# Patient Record
Sex: Male | Born: 1950 | Race: White | Hispanic: No | Smoking: Former smoker
Health system: Southern US, Community
[De-identification: ages and names within clinical notes are randomized; demographics above are authoritative.]

## PROBLEM LIST (undated history)

## (undated) DIAGNOSIS — M549 Dorsalgia, unspecified: Secondary | ICD-10-CM

## (undated) DIAGNOSIS — I10 Essential (primary) hypertension: Secondary | ICD-10-CM

## (undated) DIAGNOSIS — I639 Cerebral infarction, unspecified: Secondary | ICD-10-CM

## (undated) DIAGNOSIS — Z720 Tobacco use: Secondary | ICD-10-CM

## (undated) DIAGNOSIS — J449 Chronic obstructive pulmonary disease, unspecified: Secondary | ICD-10-CM

## (undated) HISTORY — PX: EYE SURGERY: SHX253

## (undated) HISTORY — PX: HERNIA REPAIR: SHX51

---

## 2001-03-14 ENCOUNTER — Other Ambulatory Visit: Admission: RE | Admit: 2001-03-14 | Discharge: 2001-03-14 | Payer: Self-pay | Admitting: Family Medicine

## 2003-07-31 ENCOUNTER — Ambulatory Visit (HOSPITAL_COMMUNITY): Admission: RE | Admit: 2003-07-31 | Discharge: 2003-07-31 | Payer: Self-pay | Admitting: Family Medicine

## 2005-06-29 ENCOUNTER — Ambulatory Visit (HOSPITAL_COMMUNITY): Admission: RE | Admit: 2005-06-29 | Discharge: 2005-06-29 | Payer: Self-pay | Admitting: Family Medicine

## 2007-01-09 ENCOUNTER — Ambulatory Visit (HOSPITAL_COMMUNITY): Admission: RE | Admit: 2007-01-09 | Discharge: 2007-01-09 | Payer: Self-pay | Admitting: Family Medicine

## 2008-01-31 ENCOUNTER — Emergency Department (HOSPITAL_COMMUNITY): Admission: EM | Admit: 2008-01-31 | Discharge: 2008-01-31 | Payer: Self-pay | Admitting: Emergency Medicine

## 2008-03-15 ENCOUNTER — Emergency Department (HOSPITAL_COMMUNITY): Admission: EM | Admit: 2008-03-15 | Discharge: 2008-03-15 | Payer: Self-pay | Admitting: Emergency Medicine

## 2010-08-16 ENCOUNTER — Emergency Department (HOSPITAL_COMMUNITY)
Admission: EM | Admit: 2010-08-16 | Discharge: 2010-08-16 | Disposition: A | Discharge status: Home / Self Care | Payer: Non-veteran care | Attending: Emergency Medicine | Admitting: Emergency Medicine

## 2010-08-16 ENCOUNTER — Emergency Department (HOSPITAL_COMMUNITY): Payer: Non-veteran care

## 2010-08-16 DIAGNOSIS — J449 Chronic obstructive pulmonary disease, unspecified: Secondary | ICD-10-CM | POA: Insufficient documentation

## 2010-08-16 DIAGNOSIS — I10 Essential (primary) hypertension: Secondary | ICD-10-CM | POA: Insufficient documentation

## 2010-08-16 DIAGNOSIS — M545 Low back pain, unspecified: Secondary | ICD-10-CM | POA: Insufficient documentation

## 2010-08-16 DIAGNOSIS — M51379 Other intervertebral disc degeneration, lumbosacral region without mention of lumbar back pain or lower extremity pain: Secondary | ICD-10-CM | POA: Insufficient documentation

## 2010-08-16 DIAGNOSIS — J4489 Other specified chronic obstructive pulmonary disease: Secondary | ICD-10-CM | POA: Insufficient documentation

## 2010-08-16 DIAGNOSIS — M5137 Other intervertebral disc degeneration, lumbosacral region: Secondary | ICD-10-CM | POA: Insufficient documentation

## 2010-08-16 DIAGNOSIS — Z79899 Other long term (current) drug therapy: Secondary | ICD-10-CM | POA: Insufficient documentation

## 2011-02-14 LAB — POCT CARDIAC MARKERS
CKMB, poc: 1.2
Myoglobin, poc: 119
Myoglobin, poc: 180
Troponin i, poc: <0.05
Troponin i, poc: <0.05

## 2011-02-14 LAB — DIFFERENTIAL
Eosinophils Relative: 4
Lymphocytes Relative: 30
Lymphs Abs: 3
Monocytes Absolute: 0.7
Neutro Abs: 5.8
Neutrophils Relative %: 58

## 2011-02-14 LAB — CBC
Hemoglobin: 15.1
MCHC: 34.7
RDW: 13.9
WBC: 10

## 2011-02-14 LAB — BASIC METABOLIC PANEL
BUN: 11
CO2: 27
Chloride: 105
GFR calc Af Amer: >60

## 2011-02-14 LAB — B-NATRIURETIC PEPTIDE (CONVERTED LAB): Pro B Natriuretic peptide (BNP): <30

## 2011-02-14 LAB — CULTURE, BLOOD (ROUTINE X 2)

## 2014-11-02 ENCOUNTER — Encounter (HOSPITAL_COMMUNITY): Payer: Self-pay | Admitting: *Deleted

## 2014-11-02 ENCOUNTER — Emergency Department (HOSPITAL_COMMUNITY)
Admission: EM | Admit: 2014-11-02 | Discharge: 2014-11-02 | Disposition: A | Discharge status: Home / Self Care | Payer: Medicare Other | Attending: Emergency Medicine | Admitting: Emergency Medicine

## 2014-11-02 DIAGNOSIS — Y9389 Activity, other specified: Secondary | ICD-10-CM | POA: Insufficient documentation

## 2014-11-02 DIAGNOSIS — S39012A Strain of muscle, fascia and tendon of lower back, initial encounter: Secondary | ICD-10-CM | POA: Diagnosis not present

## 2014-11-02 DIAGNOSIS — J449 Chronic obstructive pulmonary disease, unspecified: Secondary | ICD-10-CM | POA: Insufficient documentation

## 2014-11-02 DIAGNOSIS — I10 Essential (primary) hypertension: Secondary | ICD-10-CM | POA: Diagnosis not present

## 2014-11-02 DIAGNOSIS — Y998 Other external cause status: Secondary | ICD-10-CM | POA: Diagnosis not present

## 2014-11-02 DIAGNOSIS — Z720 Tobacco use: Secondary | ICD-10-CM | POA: Diagnosis not present

## 2014-11-02 DIAGNOSIS — X58XXXA Exposure to other specified factors, initial encounter: Secondary | ICD-10-CM | POA: Diagnosis not present

## 2014-11-02 DIAGNOSIS — Y9289 Other specified places as the place of occurrence of the external cause: Secondary | ICD-10-CM | POA: Insufficient documentation

## 2014-11-02 DIAGNOSIS — S3992XA Unspecified injury of lower back, initial encounter: Secondary | ICD-10-CM | POA: Diagnosis present

## 2014-11-02 HISTORY — DX: Dorsalgia, unspecified: M54.9

## 2014-11-02 HISTORY — DX: Essential (primary) hypertension: I10

## 2014-11-02 HISTORY — DX: Chronic obstructive pulmonary disease, unspecified: J44.9

## 2014-11-02 MED ORDER — METHOCARBAMOL 500 MG PO TABS: 500.0000 mg | ORAL_TABLET | Freq: Three times a day (TID) | ORAL | Status: DC

## 2014-11-02 MED ORDER — METHOCARBAMOL 500 MG PO TABS
500.0000 mg | ORAL_TABLET | Freq: Once | ORAL | Status: AC
  Administered 2014-11-02: 500 mg via ORAL
  Filled 2014-11-02: qty 1

## 2014-11-02 NOTE — ED Notes (Signed)
Pt with hx of back pain, states pain is same, started Friday, states pain radiates down left leg, denies control of bladder or bowels

## 2014-11-02 NOTE — ED Provider Notes (Signed)
CSN: [id number]     Arrival date & time 11/02/14  1535 History  This chart was scribed for non-physician practitioner, Pauline Aus, PA-C, working with Mancel Bale, MD, by Budd Palmer ED Scribe. This patient was seen in room APFT21/APFT21 and the patient's care was started at 3:56 PM  Chief Complaint  Patient presents with  . Back Pain   Patient is a 64 y.o. male presenting with back pain. The history is provided by the patient. No language interpreter was used.  Back Pain Location:  Lumbar spine and sacro-iliac joint Quality:  Aching Radiates to:  L posterior upper leg and L thigh Pain severity:  Moderate Pain is:  Worse during the day Onset quality:  Gradual Duration:  2 days Timing:  Constant Progression:  Worsening Chronicity:  Recurrent Relieved by:  Ibuprofen, lying down and heating pad Worsened by:  Standing and sitting Associated symptoms: leg pain   Associated symptoms: no abdominal pain, no bladder incontinence, no bowel incontinence, no dysuria, no fever, no numbness and no weakness    HPI Comments: Darryl George is a 64 y.o. male with a PMHx of back pain and hypertension, who presents to the Emergency Department complaining of progressively worsening lower back pain, worse on the left side onset 2 days ago.  Pt states pain radiates down to the left hip and and also left upper leg when standing. He reports heavy lifting several weeks ago. He reports relief when lying supine, and exacerbation when sitting or standing. He denies associated numbness or weakness of the lower extremities, abdominal pain, bladder or bowel incontinence. He states that he has been taking 200mg  Advil every 4 hours with moderate relief.  Past Medical History  Diagnosis Date  . Back pain   . COPD (chronic obstructive pulmonary disease)   . Hypertension    Past Surgical History  Procedure Laterality Date  . Hernia repair    . Eye surgery     History reviewed. No pertinent family  history. History  Substance Use Topics  . Smoking status: Current Every Day Smoker    Types: Cigarettes  . Smokeless tobacco: Not on file  . Alcohol Use: Yes     Comment: once a month   Review of Systems  Constitutional: Negative for fever.  Respiratory: Negative for shortness of breath.   Gastrointestinal: Negative for vomiting, abdominal pain, constipation and bowel incontinence.  Genitourinary: Negative for bladder incontinence, dysuria, hematuria, flank pain, decreased urine volume and difficulty urinating.  Musculoskeletal: Positive for back pain. Negative for joint swelling.  Skin: Negative for rash.  Neurological: Negative for weakness and numbness.  All other systems reviewed and are negative.  Allergies  Review of patient's allergies indicates no known allergies.  Home Medications   Prior to Admission medications   Not on File   Triage Vitals: BP 165/76 mmHg  Pulse 68  Temp(Src) 98.3 F (36.8 C) (Oral)  Resp 20  Ht 6\' 1"  (1.854 m)  Wt 185 lb (83.915 kg)  BMI 24.41 kg/m2  SpO2 96%  Physical Exam  Constitutional: He is oriented to person, place, and time. He appears well-developed and well-nourished. No distress.  HENT:  Head: Normocephalic and atraumatic.  Neck: Normal range of motion. Neck supple.  Cardiovascular: Normal rate, regular rhythm, normal heart sounds and intact distal pulses.   No murmur heard. Pulmonary/Chest: Effort normal and breath sounds normal. No respiratory distress.  Abdominal: Soft. He exhibits no distension. There is no tenderness.  Musculoskeletal: He exhibits tenderness. He  exhibits no edema.       Lumbar back: He exhibits tenderness and pain. He exhibits normal range of motion, no swelling, no deformity, no laceration and normal pulse.  ttp of the left lumbar paraspinal muscles.  No spinal tenderness.  DP pulses are brisk and symmetrical.  Distal sensation intact.  Hip Flexors/Extensors are intact.  Pt has 5/5 strength against  resistance of bilateral lower extremities.  Left low back pain reproduced with straight leg raise on the right   Neurological: He is alert and oriented to person, place, and time. He has normal strength. No sensory deficit. He exhibits normal muscle tone. Coordination and gait normal.  Reflex Scores:      Patellar reflexes are 2+ on the right side and 2+ on the left side.      Achilles reflexes are 2+ on the right side and 2+ on the left side. Skin: Skin is warm and dry. No rash noted.  Nursing note and vitals reviewed.  ED Course  Procedures (including critical care time) DIAGNOSTIC STUDIES: Oxygen Saturation is 96% on RA, adequate by my interpretation.    COORDINATION OF CARE: 3:59 PM - Discussed plans for pt to continue taking advil as needed no less than every 6 hours. Will give patient Robaxin for pain management. Recommended applying ice packs and heat. Advised against heavy lifting for 2 weeks. [facility]. Pt advised of plan for treatment and pt agrees.  Labs Review Labs Reviewed - No data to display  Imaging Review No results found.   EKG Interpretation None     MDM   Final diagnoses:  Lumbar strain, initial encounter    pt ambulates with a steady gait.  No focal neuro deficits on exam.  Likely musculoskeletal pain.  No concerning sx's for emergent neurological or infectious process.     I personally performed the services described in this documentation, which was scribed in my presence. The recorded information has been reviewed and is accurate.    Pauline Aus, PA-C 11/02/14 1624  Mancel Bale, MD 11/03/14 [phone]

## 2014-11-02 NOTE — Discharge Instructions (Signed)

## 2016-01-23 ENCOUNTER — Encounter (HOSPITAL_COMMUNITY)
Admission: EM | Disposition: A | Discharge status: Skilled Nursing Facility | Payer: Self-pay | Source: Home / Self Care | Attending: Cardiothoracic Surgery

## 2016-01-23 ENCOUNTER — Emergency Department (HOSPITAL_COMMUNITY): Admit: 2016-01-23 | Payer: Medicare Other

## 2016-01-23 ENCOUNTER — Encounter (HOSPITAL_COMMUNITY): Payer: Self-pay | Admitting: Emergency Medicine

## 2016-01-23 ENCOUNTER — Inpatient Hospital Stay (HOSPITAL_COMMUNITY)
Admission: EM | Admit: 2016-01-23 | Discharge: 2016-02-04 | DRG: 233 | Disposition: A | Discharge status: Skilled Nursing Facility | Payer: Medicare Other | Attending: Cardiothoracic Surgery | Admitting: Cardiothoracic Surgery

## 2016-01-23 ENCOUNTER — Emergency Department (HOSPITAL_COMMUNITY): Payer: Medicare Other

## 2016-01-23 DIAGNOSIS — I214 Non-ST elevation (NSTEMI) myocardial infarction: Secondary | ICD-10-CM

## 2016-01-23 DIAGNOSIS — R918 Other nonspecific abnormal finding of lung field: Secondary | ICD-10-CM | POA: Diagnosis not present

## 2016-01-23 DIAGNOSIS — F05 Delirium due to known physiological condition: Secondary | ICD-10-CM | POA: Diagnosis not present

## 2016-01-23 DIAGNOSIS — I080 Rheumatic disorders of both mitral and aortic valves: Secondary | ICD-10-CM | POA: Diagnosis not present

## 2016-01-23 DIAGNOSIS — Z9119 Patient's noncompliance with other medical treatment and regimen: Secondary | ICD-10-CM | POA: Diagnosis not present

## 2016-01-23 DIAGNOSIS — R0602 Shortness of breath: Secondary | ICD-10-CM

## 2016-01-23 DIAGNOSIS — I209 Angina pectoris, unspecified: Secondary | ICD-10-CM

## 2016-01-23 DIAGNOSIS — R061 Stridor: Secondary | ICD-10-CM | POA: Diagnosis not present

## 2016-01-23 DIAGNOSIS — Z720 Tobacco use: Secondary | ICD-10-CM | POA: Diagnosis not present

## 2016-01-23 DIAGNOSIS — I213 ST elevation (STEMI) myocardial infarction of unspecified site: Secondary | ICD-10-CM

## 2016-01-23 DIAGNOSIS — N182 Chronic kidney disease, stage 2 (mild): Secondary | ICD-10-CM | POA: Diagnosis present

## 2016-01-23 DIAGNOSIS — I959 Hypotension, unspecified: Secondary | ICD-10-CM | POA: Diagnosis present

## 2016-01-23 DIAGNOSIS — I129 Hypertensive chronic kidney disease with stage 1 through stage 4 chronic kidney disease, or unspecified chronic kidney disease: Secondary | ICD-10-CM | POA: Diagnosis present

## 2016-01-23 DIAGNOSIS — J90 Pleural effusion, not elsewhere classified: Secondary | ICD-10-CM | POA: Diagnosis not present

## 2016-01-23 DIAGNOSIS — I4891 Unspecified atrial fibrillation: Secondary | ICD-10-CM | POA: Diagnosis not present

## 2016-01-23 DIAGNOSIS — J440 Chronic obstructive pulmonary disease with acute lower respiratory infection: Secondary | ICD-10-CM | POA: Diagnosis not present

## 2016-01-23 DIAGNOSIS — N179 Acute kidney failure, unspecified: Secondary | ICD-10-CM | POA: Diagnosis not present

## 2016-01-23 DIAGNOSIS — F17210 Nicotine dependence, cigarettes, uncomplicated: Secondary | ICD-10-CM | POA: Diagnosis present

## 2016-01-23 DIAGNOSIS — J9600 Acute respiratory failure, unspecified whether with hypoxia or hypercapnia: Secondary | ICD-10-CM | POA: Diagnosis not present

## 2016-01-23 DIAGNOSIS — J449 Chronic obstructive pulmonary disease, unspecified: Secondary | ICD-10-CM | POA: Diagnosis present

## 2016-01-23 DIAGNOSIS — M549 Dorsalgia, unspecified: Secondary | ICD-10-CM | POA: Diagnosis present

## 2016-01-23 DIAGNOSIS — I10 Essential (primary) hypertension: Secondary | ICD-10-CM | POA: Diagnosis not present

## 2016-01-23 DIAGNOSIS — Z7901 Long term (current) use of anticoagulants: Secondary | ICD-10-CM

## 2016-01-23 DIAGNOSIS — J9811 Atelectasis: Secondary | ICD-10-CM | POA: Diagnosis not present

## 2016-01-23 DIAGNOSIS — IMO0001 Reserved for inherently not codable concepts without codable children: Secondary | ICD-10-CM | POA: Diagnosis present

## 2016-01-23 DIAGNOSIS — Z951 Presence of aortocoronary bypass graft: Secondary | ICD-10-CM

## 2016-01-23 DIAGNOSIS — I480 Paroxysmal atrial fibrillation: Secondary | ICD-10-CM | POA: Diagnosis not present

## 2016-01-23 DIAGNOSIS — J180 Bronchopneumonia, unspecified organism: Secondary | ICD-10-CM | POA: Diagnosis not present

## 2016-01-23 DIAGNOSIS — Z48812 Encounter for surgical aftercare following surgery on the circulatory system: Secondary | ICD-10-CM | POA: Diagnosis not present

## 2016-01-23 DIAGNOSIS — I25110 Atherosclerotic heart disease of native coronary artery with unstable angina pectoris: Secondary | ICD-10-CM | POA: Diagnosis not present

## 2016-01-23 DIAGNOSIS — I25810 Atherosclerosis of coronary artery bypass graft(s) without angina pectoris: Secondary | ICD-10-CM | POA: Diagnosis present

## 2016-01-23 DIAGNOSIS — I200 Unstable angina: Secondary | ICD-10-CM | POA: Diagnosis not present

## 2016-01-23 DIAGNOSIS — Z8673 Personal history of transient ischemic attack (TIA), and cerebral infarction without residual deficits: Secondary | ICD-10-CM | POA: Diagnosis not present

## 2016-01-23 DIAGNOSIS — Y95 Nosocomial condition: Secondary | ICD-10-CM | POA: Diagnosis not present

## 2016-01-23 DIAGNOSIS — R0789 Other chest pain: Secondary | ICD-10-CM | POA: Diagnosis not present

## 2016-01-23 DIAGNOSIS — R079 Chest pain, unspecified: Secondary | ICD-10-CM

## 2016-01-23 DIAGNOSIS — I2510 Atherosclerotic heart disease of native coronary artery without angina pectoris: Secondary | ICD-10-CM | POA: Diagnosis not present

## 2016-01-23 DIAGNOSIS — M6281 Muscle weakness (generalized): Secondary | ICD-10-CM | POA: Diagnosis not present

## 2016-01-23 DIAGNOSIS — I249 Acute ischemic heart disease, unspecified: Secondary | ICD-10-CM | POA: Diagnosis not present

## 2016-01-23 DIAGNOSIS — Z452 Encounter for adjustment and management of vascular access device: Secondary | ICD-10-CM | POA: Diagnosis not present

## 2016-01-23 DIAGNOSIS — Z01810 Encounter for preprocedural cardiovascular examination: Secondary | ICD-10-CM | POA: Diagnosis not present

## 2016-01-23 HISTORY — PX: CARDIAC CATHETERIZATION: SHX172

## 2016-01-23 HISTORY — DX: Tobacco use: Z72.0

## 2016-01-23 HISTORY — DX: Cerebral infarction, unspecified: I63.9

## 2016-01-23 LAB — CBC WITH DIFFERENTIAL/PLATELET
Basophils Absolute: 0.1 10*3/uL (ref 0.0–0.1)
Basophils Relative: 1 %
Eosinophils Absolute: 0.3 10*3/uL (ref 0.0–0.7)
Eosinophils Relative: 2 %
HEMATOCRIT: 42 % (ref 39.0–52.0)
HEMOGLOBIN: 14.9 g/dL (ref 13.0–17.0)
LYMPHS ABS: 4 10*3/uL (ref 0.7–4.0)
LYMPHS PCT: 30 %
MCH: 29.7 pg (ref 26.0–34.0)
MCHC: 35.5 g/dL (ref 30.0–36.0)
MCV: 83.7 fL (ref 78.0–100.0)
Monocytes Absolute: 0.9 10*3/uL (ref 0.1–1.0)
Monocytes Relative: 7 %
NEUTROS PCT: 60 %
Neutro Abs: 7.9 10*3/uL — ABNORMAL HIGH (ref 1.7–7.7)
Platelets: 350 10*3/uL (ref 150–400)
RBC: 5.02 MIL/uL (ref 4.22–5.81)
RDW: 13.4 % (ref 11.5–15.5)
WBC: 13.2 10*3/uL — AB (ref 4.0–10.5)

## 2016-01-23 LAB — COMPREHENSIVE METABOLIC PANEL
ALK PHOS: 110 U/L (ref 38–126)
ALT: 16 U/L — AB (ref 17–63)
AST: 20 U/L (ref 15–41)
Albumin: 3.8 g/dL (ref 3.5–5.0)
Anion gap: 10 (ref 5–15)
BUN: 16 mg/dL (ref 6–20)
CALCIUM: 9.1 mg/dL (ref 8.9–10.3)
CO2: 21 mmol/L — AB (ref 22–32)
CREATININE: 1.33 mg/dL — AB (ref 0.61–1.24)
Chloride: 105 mmol/L (ref 101–111)
GFR, EST NON AFRICAN AMERICAN: 54 mL/min — AB (ref >60)
Glucose, Bld: 128 mg/dL — ABNORMAL HIGH (ref 65–99)
Potassium: 3.3 mmol/L — ABNORMAL LOW (ref 3.5–5.1)
Sodium: 136 mmol/L (ref 135–145)
Total Bilirubin: 0.5 mg/dL (ref 0.3–1.2)
Total Protein: 6.8 g/dL (ref 6.5–8.1)

## 2016-01-23 LAB — TROPONIN I
Troponin I: <0.03 ng/mL (ref <0.03)
Troponin I: 0.18 ng/mL (ref <0.03)

## 2016-01-23 LAB — I-STAT CHEM 8, ED
BUN: 14 mg/dL (ref 6–20)
Calcium, Ion: 1.05 mmol/L — ABNORMAL LOW (ref 1.15–1.40)
Chloride: 103 mmol/L (ref 101–111)
Creatinine, Ser: 1.4 mg/dL — ABNORMAL HIGH (ref 0.61–1.24)
Glucose, Bld: 102 mg/dL — ABNORMAL HIGH (ref 65–99)
HEMATOCRIT: 43 % (ref 39.0–52.0)
HEMOGLOBIN: 14.6 g/dL (ref 13.0–17.0)
POTASSIUM: 3.4 mmol/L — AB (ref 3.5–5.1)
SODIUM: 139 mmol/L (ref 135–145)
TCO2: 20 mmol/L (ref 0–100)

## 2016-01-23 LAB — I-STAT TROPONIN, ED: TROPONIN I, POC: 0 ng/mL (ref 0.00–0.08)

## 2016-01-23 SURGERY — LEFT HEART CATH AND CORONARY ANGIOGRAPHY
Anesthesia: LOCAL

## 2016-01-23 MED ORDER — NITROGLYCERIN IN D5W 200-5 MCG/ML-% IV SOLN
10.0000 ug/min | INTRAVENOUS | Status: DC
  Administered 2016-01-23: 10 ug/min via INTRAVENOUS
  Filled 2016-01-23: qty 250

## 2016-01-23 MED ORDER — ASPIRIN 81 MG PO CHEW
CHEWABLE_TABLET | ORAL | Status: AC
  Filled 2016-01-23: qty 4

## 2016-01-23 MED ORDER — HEPARIN (PORCINE) IN NACL 2-0.9 UNIT/ML-% IJ SOLN
INTRAMUSCULAR | Status: DC | PRN
  Administered 2016-01-23: 1500 mL

## 2016-01-23 MED ORDER — HEPARIN SODIUM (PORCINE) 5000 UNIT/ML IJ SOLN
4000.0000 [IU] | Freq: Once | INTRAMUSCULAR | Status: AC
  Administered 2016-01-23: 4000 [IU] via INTRAVENOUS

## 2016-01-23 MED ORDER — NITROGLYCERIN 0.4 MG SL SUBL
SUBLINGUAL_TABLET | SUBLINGUAL | Status: AC
  Administered 2016-01-23: 0.4 mg via SUBLINGUAL
  Filled 2016-01-23: qty 1

## 2016-01-23 MED ORDER — METOPROLOL TARTRATE 12.5 MG HALF TABLET
12.5000 mg | ORAL_TABLET | Freq: Two times a day (BID) | ORAL | Status: DC
  Administered 2016-01-23 – 2016-01-24 (×3): 12.5 mg via ORAL
  Filled 2016-01-23 (×3): qty 1

## 2016-01-23 MED ORDER — ASPIRIN 81 MG PO CHEW
81.0000 mg | CHEWABLE_TABLET | Freq: Every day | ORAL | Status: DC
  Administered 2016-01-23 – 2016-01-24 (×2): 81 mg via ORAL
  Filled 2016-01-23 (×2): qty 1

## 2016-01-23 MED ORDER — SODIUM CHLORIDE 0.9% FLUSH
3.0000 mL | Freq: Two times a day (BID) | INTRAVENOUS | Status: DC
  Administered 2016-01-24 (×2): 3 mL via INTRAVENOUS

## 2016-01-23 MED ORDER — LIDOCAINE HCL (PF) 1 % IJ SOLN
INTRAMUSCULAR | Status: DC | PRN
  Administered 2016-01-23: 2 mL

## 2016-01-23 MED ORDER — ALBUTEROL SULFATE (2.5 MG/3ML) 0.083% IN NEBU
5.0000 mg | INHALATION_SOLUTION | Freq: Once | RESPIRATORY_TRACT | Status: DC
  Filled 2016-01-23: qty 6

## 2016-01-23 MED ORDER — ASPIRIN 81 MG PO CHEW
324.0000 mg | CHEWABLE_TABLET | Freq: Once | ORAL | Status: AC
  Administered 2016-01-23: 324 mg via ORAL

## 2016-01-23 MED ORDER — NITROGLYCERIN IN D5W 200-5 MCG/ML-% IV SOLN
INTRAVENOUS | Status: AC
  Filled 2016-01-23: qty 250

## 2016-01-23 MED ORDER — TIROFIBAN HCL IN NACL 5-0.9 MG/100ML-% IV SOLN
INTRAVENOUS | Status: AC
  Filled 2016-01-23: qty 100

## 2016-01-23 MED ORDER — MIDAZOLAM HCL 2 MG/2ML IJ SOLN
INTRAMUSCULAR | Status: AC
  Filled 2016-01-23: qty 2

## 2016-01-23 MED ORDER — IOPAMIDOL (ISOVUE-370) INJECTION 76%
INTRAVENOUS | Status: AC
  Filled 2016-01-23: qty 125

## 2016-01-23 MED ORDER — NITROGLYCERIN 1 MG/10 ML FOR IR/CATH LAB
INTRA_ARTERIAL | Status: AC
Start: 2016-01-23 — End: 2016-01-23
  Filled 2016-01-23: qty 10

## 2016-01-23 MED ORDER — HEPARIN (PORCINE) IN NACL 2-0.9 UNIT/ML-% IJ SOLN
INTRAMUSCULAR | Status: AC
  Filled 2016-01-23: qty 1000

## 2016-01-23 MED ORDER — SODIUM CHLORIDE 0.9 % WEIGHT BASED INFUSION
1.0000 mL/kg/h | INTRAVENOUS | Status: AC
  Administered 2016-01-23: 1 mL/kg/h via INTRAVENOUS

## 2016-01-23 MED ORDER — SODIUM CHLORIDE 0.9% FLUSH: 3.0000 mL | INTRAVENOUS | Status: DC | PRN

## 2016-01-23 MED ORDER — SODIUM CHLORIDE 0.9 % IV SOLN: 250.0000 mL | INTRAVENOUS | Status: DC | PRN

## 2016-01-23 MED ORDER — VERAPAMIL HCL 2.5 MG/ML IV SOLN
INTRAVENOUS | Status: AC
  Filled 2016-01-23: qty 2

## 2016-01-23 MED ORDER — DILTIAZEM HCL ER 240 MG PO CP24: 240.0000 mg | ORAL_CAPSULE | Freq: Every day | ORAL | Status: DC

## 2016-01-23 MED ORDER — FENTANYL CITRATE (PF) 100 MCG/2ML IJ SOLN
INTRAMUSCULAR | Status: AC
  Filled 2016-01-23: qty 2

## 2016-01-23 MED ORDER — NITROGLYCERIN 0.4 MG SL SUBL
0.4000 mg | SUBLINGUAL_TABLET | Freq: Once | SUBLINGUAL | Status: AC
  Administered 2016-01-23: 0.4 mg via SUBLINGUAL

## 2016-01-23 MED ORDER — FENTANYL CITRATE (PF) 100 MCG/2ML IJ SOLN
INTRAMUSCULAR | Status: DC | PRN
  Administered 2016-01-23: 50 ug via INTRAVENOUS

## 2016-01-23 MED ORDER — HEPARIN (PORCINE) IN NACL 100-0.45 UNIT/ML-% IJ SOLN
INTRAMUSCULAR | Status: AC
  Filled 2016-01-23: qty 250

## 2016-01-23 MED ORDER — LISINOPRIL 20 MG PO TABS
40.0000 mg | ORAL_TABLET | Freq: Every day | ORAL | Status: DC
  Administered 2016-01-24: 40 mg via ORAL
  Filled 2016-01-23: qty 2

## 2016-01-23 MED ORDER — ONDANSETRON HCL 4 MG/2ML IJ SOLN: 4.0000 mg | Freq: Four times a day (QID) | INTRAMUSCULAR | Status: DC | PRN

## 2016-01-23 MED ORDER — IOPAMIDOL (ISOVUE-370) INJECTION 76%
INTRAVENOUS | Status: DC | PRN
  Administered 2016-01-23: 85 mL via INTRAVENOUS

## 2016-01-23 MED ORDER — MIDAZOLAM HCL 2 MG/2ML IJ SOLN
INTRAMUSCULAR | Status: DC | PRN
  Administered 2016-01-23: 1 mg via INTRAVENOUS

## 2016-01-23 MED ORDER — SODIUM CHLORIDE 0.9 % IV SOLN
INTRAVENOUS | Status: DC | PRN
  Administered 2016-01-23: 100 mL/h via INTRAVENOUS

## 2016-01-23 MED ORDER — ATORVASTATIN CALCIUM 80 MG PO TABS
80.0000 mg | ORAL_TABLET | Freq: Every day | ORAL | Status: DC
  Administered 2016-01-24: 80 mg via ORAL
  Filled 2016-01-23: qty 1

## 2016-01-23 MED ORDER — TIROFIBAN (AGGRASTAT) BOLUS VIA INFUSION
INTRAVENOUS | Status: DC | PRN
  Administered 2016-01-23 (×2): 2040 ug via INTRAVENOUS

## 2016-01-23 MED ORDER — ALBUTEROL SULFATE (2.5 MG/3ML) 0.083% IN NEBU: 3.0000 mL | INHALATION_SOLUTION | Freq: Four times a day (QID) | RESPIRATORY_TRACT | Status: DC | PRN

## 2016-01-23 MED ORDER — HEPARIN (PORCINE) IN NACL 100-0.45 UNIT/ML-% IJ SOLN
14.0000 [IU]/kg/h | Freq: Once | INTRAMUSCULAR | Status: AC
  Administered 2016-01-23: 14 [IU]/kg/h via INTRAVENOUS

## 2016-01-23 MED ORDER — VERAPAMIL HCL 2.5 MG/ML IV SOLN
INTRAVENOUS | Status: DC | PRN
  Administered 2016-01-23: 10 mL via INTRA_ARTERIAL

## 2016-01-23 MED ORDER — HEPARIN (PORCINE) IN NACL 100-0.45 UNIT/ML-% IJ SOLN
1300.0000 [IU]/h | INTRAMUSCULAR | Status: DC
  Administered 2016-01-23: 1100 [IU]/h via INTRAVENOUS
  Administered 2016-01-24: 1300 [IU]/h via INTRAVENOUS
  Filled 2016-01-23 (×2): qty 250

## 2016-01-23 MED ORDER — LIDOCAINE HCL (PF) 1 % IJ SOLN
INTRAMUSCULAR | Status: AC
  Filled 2016-01-23: qty 30

## 2016-01-23 MED ORDER — NITROGLYCERIN IN D5W 200-5 MCG/ML-% IV SOLN
INTRAVENOUS | Status: DC | PRN
  Administered 2016-01-23: 10 ug/min via INTRAVENOUS

## 2016-01-23 MED ORDER — ACETAMINOPHEN 325 MG PO TABS
650.0000 mg | ORAL_TABLET | ORAL | Status: DC | PRN
  Administered 2016-01-25: 650 mg via ORAL
  Filled 2016-01-23: qty 2

## 2016-01-23 SURGICAL SUPPLY — 11 items
CATH INFINITI 5 FR JL3.5 (CATHETERS) ×2 IMPLANT
CATH INFINITI JR4 5F (CATHETERS) ×2 IMPLANT
DEVICE RAD COMP TR BAND LRG (VASCULAR PRODUCTS) ×2 IMPLANT
GLIDESHEATH SLEND A-KIT 6F 22G (SHEATH) ×2 IMPLANT
KIT ENCORE 26 ADVANTAGE (KITS) ×2 IMPLANT
KIT HEART LEFT (KITS) ×2 IMPLANT
PACK CARDIAC CATHETERIZATION (CUSTOM PROCEDURE TRAY) ×2 IMPLANT
TRANSDUCER W/STOPCOCK (MISCELLANEOUS) ×2 IMPLANT
TUBING CIL FLEX 10 FLL-RA (TUBING) ×2 IMPLANT
WIRE EMERALD 3MM-J .035X260CM (WIRE) ×2 IMPLANT
WIRE HI TORQ VERSACORE-J 145CM (WIRE) ×2 IMPLANT

## 2016-01-23 NOTE — CV Procedure (Signed)
   Heavy thrombus burden in the proximal RCA which is a culprit vessel for the patient's ST elevation MI which fortunately spontaneously recanalized. The distal vessel is large. PCI will be associated with significant risk of clot embolization.  Severe LAD disease with multifocal proximal to mid 85% stenosis. 85% stenosis in a large diagonal which also forms a bifurcation with the proximal LAD. Ostial 80% obstruction in the second moderate size diagonal also forming a bifurcation with the diseased LAD segment.  50% ostial ramus.  40-50% proximal to mid circumflex.  Normal LV function with EF 65. Normal LVEDP.

## 2016-01-23 NOTE — Progress Notes (Signed)
CRITICAL VALUE ALERT  Critical value received:  Troponin  Date of notification:  01/23/16  Time of notification:  2153  Critical value read back:Yes.    Nurse who received alert:  Alycia Rossetti  MD notified (1st page):  Expected value post cath

## 2016-01-23 NOTE — ED Provider Notes (Addendum)
[facility] DEPT Provider Note   CSN: [id number] Arrival date & time: 01/23/16  1758     History   Chief Complaint Chief Complaint  Patient presents with  . Shortness of Breath    HPI Darryl George is a 65 y.o. male.  The patient was mowing the grass when he got acutely short of breath. And then shortly thereafter he got bilateral arm pain that was fairly severe 9 out of 10. This all occurred approximately 20 minutes prior to arrival. Patient's past history significant for COPD and hypertension. Patient does not feel like she's wheezing. Patient has nausea but no vomiting he has some epigastric and lower chest pain as well. But the arm pain is the greatest. No back pain no jaw pain. Patient did not take any medicines at home for this no aspirin. Patient states that on March E Dupuis discovered to have a small stroke on MRI that was done for dizziness. States that he was started on Coumadin after that. Patient does not have a cardiologist. No known history of any cardiac disease.      Past Medical History:  Diagnosis Date  . Back pain   . COPD (chronic obstructive pulmonary disease) (HCC)   . Hypertension     There are no active problems to display for this patient.   Past Surgical History:  Procedure Laterality Date  . EYE SURGERY    . HERNIA REPAIR    . Stroke         Home Medications    Prior to Admission medications   Medication Sig Start Date End Date Taking? Authorizing Provider  ibuprofen (ADVIL,MOTRIN) 200 MG tablet Take 200 mg by mouth every 6 (six) hours as needed (as needed).    Historical Provider, MD  methocarbamol (ROBAXIN) 500 MG tablet Take 1 tablet (500 mg total) by mouth 3 (three) times daily. 11/02/14   Tammy Triplett, PA-C    Family History Family History  Problem Relation Age of Onset  . Cancer Mother   . Heart failure Mother   . Kidney disease Mother   . Aneurysm Father   . Cancer Other     Social History Social History  Substance  Use Topics  . Smoking status: Current Every Day Smoker    Packs/day: 1.00    Types: Cigarettes  . Smokeless tobacco: Never Used  . Alcohol use No     Comment: once a month     Allergies   Review of patient's allergies indicates no known allergies.   Review of Systems Review of Systems  Constitutional: Negative for diaphoresis.  HENT: Negative for congestion.   Eyes: Negative for visual disturbance.  Respiratory: Positive for shortness of breath. Negative for wheezing.   Cardiovascular: Positive for chest pain. Negative for leg swelling.  Gastrointestinal: Positive for abdominal pain and nausea. Negative for vomiting.  Genitourinary: Negative for hematuria.  Musculoskeletal: Negative for back pain and neck pain.  Skin: Negative for rash.  Neurological: Negative for headaches.  Hematological: Bruises/bleeds easily.  Psychiatric/Behavioral: Negative for confusion.     Physical Exam Updated Vital Signs BP 154/81   Pulse 79   Temp 98 F (36.7 C) (Oral)   Resp 17   Ht 6\' 1"  (1.854 m)   Wt 81.6 kg   SpO2 94%   BMI 23.75 kg/m   Physical Exam  Constitutional: He is oriented to person, place, and time. He appears well-developed and well-nourished. He appears distressed.  HENT:  Head: Normocephalic and atraumatic.  Mouth/Throat: Oropharynx is clear and moist.  Eyes: Conjunctivae and EOM are normal. Pupils are equal, round, and reactive to light.  Neck: Normal range of motion. Neck supple.  Cardiovascular: Normal rate, regular rhythm and normal heart sounds.   Pulmonary/Chest: Effort normal and breath sounds normal. He has no wheezes. He has no rales. He exhibits no tenderness.  Abdominal: Soft. Bowel sounds are normal. There is no tenderness.  Musculoskeletal: Normal range of motion.  Neurological: He is alert and oriented to person, place, and time. No cranial nerve deficit. He exhibits normal muscle tone. Coordination normal.  Skin: Skin is warm.  Nursing note and  vitals reviewed.    ED Treatments / Results  Labs (all labs ordered are listed, but only abnormal results are displayed) Labs Reviewed  CBC WITH DIFFERENTIAL/PLATELET - Abnormal; Notable for the following:       Result Value   WBC 13.2 (*)    Neutro Abs 7.9 (*)    All other components within normal limits  I-STAT CHEM 8, ED - Abnormal; Notable for the following:    Potassium 3.4 (*)    Creatinine, Ser 1.40 (*)    Glucose, Bld 102 (*)    Calcium, Ion 1.05 (*)    All other components within normal limits  TROPONIN I  COMPREHENSIVE METABOLIC PANEL  I-STAT TROPOININ, ED    EKG  EKG Interpretation  Date/Time:  Saturday January 23 2016 18:15:54 EDT Ventricular Rate:  72 PR Interval:    QRS Duration: 94 QT Interval:  395 QTC Calculation: 433 R Axis:   52 Text Interpretation:  Sinus rhythm Inferior infarct, acute (RCA) Probable RV involvement, suggest recording right precordial leads STEMI Confirmed by Jeena Arnett  MD, Rayla Pember ([id number]) on 01/23/2016 6:48:38 PM     Repeat EKG showed evidence of increased ST segment elevation inferiorly consistent with an acute inferior infarct.  Radiology No results found.  Procedures Procedures (including critical care time)  CRITICAL CARE Performed by: Chanler Schreiter Total critical care time: 30 minutes Critical care time was exclusive of separately billable procedures and treating other patients. Critical care was necessary to treat or prevent imminent or life-threatening deterioration. Critical care was time spent personally by me on the following activities: development of treatment plan with patient and/or surrogate as well as nursing, discussions with consultants, evaluation of patient's response to treatment, examination of patient, obtaining history from patient or surrogate, ordering and performing treatments and interventions, ordering and review of laboratory studies, ordering and review of radiographic studies, pulse oximetry and  re-evaluation of patient's condition.  Medications Ordered in ED Medications  albuterol (PROVENTIL) (2.5 MG/3ML) 0.083% nebulizer solution 5 mg (5 mg Nebulization Not Given 01/23/16 1831)  nitroGLYCERIN 0.2 mg/mL in dextrose 5 % infusion (not administered)  aspirin chewable tablet 324 mg (324 mg Oral Given 01/23/16 1822)  heparin injection 4,000 Units (4,000 Units Intravenous Given 01/23/16 1826)  heparin ADULT infusion 100 units/mL (25000 units/230mL sodium chloride 0.45%) (14 Units/kg/hr  81.6 kg Intravenous Transfusing/Transfer 01/23/16 1836)  nitroGLYCERIN (NITROSTAT) SL tablet 0.4 mg (0.4 mg Sublingual Given 01/23/16 1825)  nitroGLYCERIN (NITROSTAT) SL tablet 0.4 mg (0.4 mg Sublingual Given 01/23/16 1832)     Initial Impression / Assessment and Plan / ED Course  I have reviewed the triage vital signs and the nursing notes.  Pertinent labs & imaging results that were available during my care of the patient were reviewed by me and considered in my medical decision making (see chart for details).  Clinical Course  Patient with onset of some epigastric and lower chest pain and bilateral arm pain arm pain greater than the epigastric pain about 20 minutes prior to arrival. Associated with shortness of breath some nausea but no vomiting. Patient without known cardiac history does have a history of hypertension. Patient with diagnosis based on MRI of a small stroke in March patient states that he is on Coumadin for that reason. Patient does not have a cardiologist.   Triage EKG with suspicion for possible inferior MI. When I walked in the room ST segment elevation on lead to looked greater repeat 12-lead EKG showed changes in lead to 3 and aVF consistent with inferior MI. Patient was stating that the pain was 9 out of 10. Patient given aspirin sublingual nitroglycerin and the pain went down to about a 2. On cardiac monitoring ST segment elevation in lead 2 showed the improvement. Prior to this had called  code STEMI patient started on heparin bolus 4000 units and started on heparin drip 14 units per kilogram. Discuss with Dr. Henry Smith cardiology at [facility] patient will be taken by EMS to the Cath Lab. Verdis Prime Patient without any back pain no jaw pain.  Final Clini room 8cal Impressions(s) / ED Diagnoses   Final diagnoses:  ST elevation myocardial infarction (STEMI), unspecified artery [facility](HCC)    New Prescriptions New Prescriptions   No medications on file     Vanetta MuldersScott Trystian Crisanto, MD 01/23/16 1847    Vanetta MuldersScott Dymphna Wadley, MD 01/23/16 [phone]

## 2016-01-23 NOTE — ED Triage Notes (Signed)
Pt reports onset of SOB with bilat arm pain that started approx 15 min PTA. No nausea or dizziness. Pt coughing but not productive at present.

## 2016-01-23 NOTE — ED Triage Notes (Signed)
Pt reports SOB and bilat arm pain that started approx 15 min ago.

## 2016-01-23 NOTE — ED Notes (Signed)
EMS arrived to transport patient to [facility]. Pt stable and ready for transport. Report given to EMS by primary RN.

## 2016-01-23 NOTE — H&P (Signed)
History & Physical    Patient ID: Darryl George MRN: [id number], DOB/AGE: 1951-01-03   Admit date: 01/23/2016   Primary Physician: PROVIDER NOT IN SYSTEM Primary Cardiologist: n/a   History of Present Illness    Darryl George is a 65 y.o. male with past medical history of prior stroke, hypertension, COPD, and tobacco abuse is here for chest pain.  Patient reports earlier today while mowing his lawn, he noticed that he was more short of breath than usual.  Because of his shortness of breath, he was unable to finish mowing his lawn.  While walking into his home, he noticed pain in both of his arms that he describes as sharp.  Because he didn't fell well, he told his wife to bring him to [facility].  He didn't notice chest pain until he was checked into the emergency room at [facility].  The chest pain was located at the center of his chest with continued pain going down his arms.  He describes the chest pain as sharp in nature as well.  He does report feeling nauseous but denies other symptoms as lightheadedness, dizziness, sweating, fainting, or feeling as if he was going to faint.  At this time he is chest pain free and has undergone a cardiac catheterization.  He denies every having similar chest pain before in the past.    Past Medical History    Past Medical History:  Diagnosis Date  . Back pain   . COPD (chronic obstructive pulmonary disease) (HCC)   . Hypertension   . Stroke (cerebrum) [facility])     Past Surgical History:  Procedure Laterality Date  . EYE SURGERY    . HERNIA REPAIR    . Stroke       Allergies  No Known Allergies   Home Medications    Prior to Admission medications   Medication Sig Start Date End Date Taking? Authorizing Provider  ibuprofen (ADVIL,MOTRIN) 200 MG tablet Take 200 mg by mouth every 6 (six) hours as needed (as needed).    Historical Provider, MD  methocarbamol (ROBAXIN) 500 MG tablet Take 1 tablet (500 mg total) by mouth 3 (three)  times daily. 11/02/14   Tammy Triplett, PA-C    Family History    Family History  Problem Relation Age of Onset  . Cancer Mother   . Heart failure Mother   . Kidney disease Mother   . Aneurysm Father   . Hypertension Father   . Cancer Other   . Breast cancer Sister     Social History    Social History   Social History  . Marital status: Divorced    Spouse name: N/A  . Number of children: N/A  . Years of education: N/A   Occupational History  . Not on file.   Social History Main Topics  . Smoking status: Current Every Day Smoker    Packs/day: 1.00    Types: Cigarettes  . Smokeless tobacco: Never Used  . Alcohol use No     Comment: once a month  . Drug use: No  . Sexual activity: Not on file   Other Topics Concern  . Not on file   Social History Narrative  . No narrative on file     Review of Systems    All other systems reviewed and are otherwise negative except as noted above.  Physical Exam    Blood pressure (!) 146/85, pulse 63, temperature 98.2 F (36.8 C), temperature  source Oral, resp. rate 14, height 6\' 1"  (1.854 m), weight 79.9 kg (176 lb 2.4 oz), SpO2 96 %.  General: Well developed, well nourished,male in no acute distress. Head: Normocephalic, atraumatic, sclera non-icteric, no xanthomas, nares are without discharge. Dentition:  Neck: No carotid bruits. JVD not elevated.  Lungs: Respirations regular and unlabored, without wheezes or rales.  Heart: Regular rate and rhythm. No S3 or S4.  No murmur, no rubs, or gallops appreciated. Abdomen: Soft, non-tender, non-distended with normoactive bowel sounds. No hepatomegaly. No rebound/guarding. No obvious abdominal masses. Msk:  Strength and tone appear normal for age. No joint deformities or effusions. Extremities: No clubbing or cyanosis. No edema.  Distal pedal pulses are 2+ bilaterally. Neuro: Alert and oriented X 3. Moves all extremities spontaneously. No focal deficits noted. Psych:  Responds to  questions appropriately with a normal affect. Skin: No rashes or lesions noted  Labs    Troponin [facility] Test)  Recent Labs  01/23/16 1824  TROPIPOC 0.00    Recent Labs  01/23/16 1814 01/23/16 2042  TROPONINI <0.03 0.18*   Lab Results  Component Value Date   WBC 13.2 (H) 01/23/2016   HGB 14.6 01/23/2016   HCT 43.0 01/23/2016   MCV 83.7 01/23/2016   PLT 350 01/23/2016     Recent Labs Lab 01/23/16 1814 01/23/16 1826  NA 136 139  K 3.3* 3.4*  CL 105 103  CO2 21*  --   BUN 16 14  CREATININE 1.33* 1.40*  CALCIUM 9.1  --   PROT 6.8  --   BILITOT 0.5  --   ALKPHOS 110  --   ALT 16*  --   AST 20  --   GLUCOSE 128* 102*   No results found for: CHOL, HDL, LDLCALC, TRIG No results found for: DDIMER  No results found for: BNP Pro B Natriuretic peptide (BNP)  Date/Time Value Ref Range Status  01/31/2008 12:50 AM <30.0  Final   No results for input(s): INR in the last 72 hours.    Radiology Studies    No results found.  EKG & Cardiac Imaging    EKG:  01/23/16 (18:12):  NSR, ST-elevations in inferior leads  ECHOCARDIOGRAM:  Assessment & Plan    Principal Problem:   ACS (acute coronary syndrome) (HCC) Active Problems:   Coronary artery disease involving native coronary artery of native heart without angina pectoris   COPD bronchitis   Chronic anticoagulation  # Acute coronary syndrome: Patient with equivocal ST-elevations in the inferior leads in the setting of chest pain.  He has undergone emergent left heart catheterization with noted multi-vessel disease on cardiac catheterization.  Patient will did not undergo a percutaneous intervention and will await surgery evaluation for potential coronary artery bypass surgery.  At this time he is chest pain free. He has received full dose aspirin and is now on a heparin drip. - Continue heparin drip and aspirin. - CT surgery evaluation in the morning.   - Started metoprolol 12.5mg /daily - Continue  heparin drip  # Hypertension: Patient with a known history of essential hypertension. Has an elevated creatinine that is likely chronic renal disease.  Blood pressure at this time is controlled.  Was taking diltiazem and lisinopril prior to admission. - Restart home lisinopril. - Hold home diltiazem and start metoprolol.    # COPD: Patient with a history of COPD in the setting of tobacco abuse.  He is without a COPD exacerbation at this time. - Continue home albuterol  inhaler as needed.    # Renal failure: Patient with an elevated creatinine in the setting of a normal BUN.  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  He does not have any electrolyte abnormalities. - Will continue normal saline post cardiac catheterization.   - Monitor BMP and avoid any additional nephrotoxic agents.    # Prior stroke: Patient with prior stroke for which patient reports that he has a clot in the back of his neck.  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  He reports being on warfarin but unable to recall the amount. - Hold warfarin at this time. - Check INR.    # Prophylaxis: - heparin drip  Signed, Judie GrieveKamal D Henderson, MD 01/23/2016, 10:17 PM

## 2016-01-23 NOTE — ED Notes (Signed)
Attempted to call report at this time to cath lab, no answer.

## 2016-01-23 NOTE — Progress Notes (Signed)
ANTICOAGULATION CONSULT NOTE - Initial Consult  Pharmacy Consult for Heparin Indication: chest pain/ACS  No Known Allergies  Patient Measurements: Height: 6\' 1"  (185.4 cm) Weight: 180 lb (81.6 kg) IBW/kg (Calculated) : 79.9   Vital Signs: Temp: 98 F (36.7 C) (09/09 1805) Temp Source: Oral (09/09 1805) BP: 149/89 (09/09 2002) Pulse Rate: 0 (09/09 2007)  Labs:  Recent Labs  01/23/16 1814 01/23/16 1826  HGB 14.9 14.6  HCT 42.0 43.0  PLT 350  --   CREATININE 1.33* 1.40*  TROPONINI <0.03  --     Estimated Creatinine Clearance: 57.9 mL/min (by C-G formula based on SCr of 1.4 mg/dL).   Medical History: Past Medical History:  Diagnosis Date  . Back pain   . COPD (chronic obstructive pulmonary disease) (HCC)   . Hypertension      Assessment: 3 yoM admitted with ST elevation, s/p emergent PCI that found heavy thrombus burden in proximal RCA. Pharmacy to resume heparin 4 hours post sheath removal which occurred at 20:00 pm this evening. CBC stable   Goal of Therapy:  Heparin level 0.3-0.7 units/ml Monitor platelets by anticoagulation protocol: Yes   Plan:  1. Begin heparin infusion at 1100 units/hr on 9/10 at 0000 2. Heparin level in am  3. Daily heparin level and CBC

## 2016-01-23 NOTE — Progress Notes (Signed)
   01/23/16 1900  Clinical Encounter Type  Visited With Patient not available  Visit Type ED;Other (Comment) (STEMI)    Code STEMI, ETA . Chaplain to ED--patient taken directly to cath lab with no family present.

## 2016-01-24 ENCOUNTER — Inpatient Hospital Stay (HOSPITAL_COMMUNITY): Payer: Medicare Other

## 2016-01-24 ENCOUNTER — Encounter (HOSPITAL_COMMUNITY): Payer: Self-pay | Admitting: *Deleted

## 2016-01-24 DIAGNOSIS — I25110 Atherosclerotic heart disease of native coronary artery with unstable angina pectoris: Secondary | ICD-10-CM

## 2016-01-24 DIAGNOSIS — Z720 Tobacco use: Secondary | ICD-10-CM | POA: Diagnosis present

## 2016-01-24 DIAGNOSIS — I639 Cerebral infarction, unspecified: Secondary | ICD-10-CM | POA: Insufficient documentation

## 2016-01-24 DIAGNOSIS — J449 Chronic obstructive pulmonary disease, unspecified: Secondary | ICD-10-CM | POA: Diagnosis present

## 2016-01-24 DIAGNOSIS — Z01810 Encounter for preprocedural cardiovascular examination: Secondary | ICD-10-CM

## 2016-01-24 DIAGNOSIS — I10 Essential (primary) hypertension: Secondary | ICD-10-CM | POA: Diagnosis present

## 2016-01-24 DIAGNOSIS — I249 Acute ischemic heart disease, unspecified: Secondary | ICD-10-CM

## 2016-01-24 DIAGNOSIS — I214 Non-ST elevation (NSTEMI) myocardial infarction: Secondary | ICD-10-CM

## 2016-01-24 DIAGNOSIS — I2510 Atherosclerotic heart disease of native coronary artery without angina pectoris: Secondary | ICD-10-CM

## 2016-01-24 LAB — COMPREHENSIVE METABOLIC PANEL
ALT: 15 U/L — ABNORMAL LOW (ref 17–63)
ANION GAP: 7 (ref 5–15)
AST: 22 U/L (ref 15–41)
Albumin: 3 g/dL — ABNORMAL LOW (ref 3.5–5.0)
Alkaline Phosphatase: 89 U/L (ref 38–126)
BILIRUBIN TOTAL: 0.5 mg/dL (ref 0.3–1.2)
BUN: 10 mg/dL (ref 6–20)
CHLORIDE: 104 mmol/L (ref 101–111)
CO2: 23 mmol/L (ref 22–32)
Calcium: 8.9 mg/dL (ref 8.9–10.3)
Creatinine, Ser: 1.24 mg/dL (ref 0.61–1.24)
GFR, EST NON AFRICAN AMERICAN: 58 mL/min — AB (ref >60)
Glucose, Bld: 138 mg/dL — ABNORMAL HIGH (ref 65–99)
POTASSIUM: 3.5 mmol/L (ref 3.5–5.1)
Sodium: 134 mmol/L — ABNORMAL LOW (ref 135–145)
TOTAL PROTEIN: 5.5 g/dL — AB (ref 6.5–8.1)

## 2016-01-24 LAB — CBC
HEMATOCRIT: 39.7 % (ref 39.0–52.0)
HEMOGLOBIN: 13.1 g/dL (ref 13.0–17.0)
MCH: 28.7 pg (ref 26.0–34.0)
MCHC: 33 g/dL (ref 30.0–36.0)
MCV: 87.1 fL (ref 78.0–100.0)
Platelets: 330 10*3/uL (ref 150–400)
RBC: 4.56 MIL/uL (ref 4.22–5.81)
RDW: 13.7 % (ref 11.5–15.5)
WBC: 10.5 10*3/uL (ref 4.0–10.5)

## 2016-01-24 LAB — URINALYSIS, ROUTINE W REFLEX MICROSCOPIC
BILIRUBIN URINE: NEGATIVE
Glucose, UA: NEGATIVE mg/dL
HGB URINE DIPSTICK: NEGATIVE
KETONES UR: NEGATIVE mg/dL
Leukocytes, UA: NEGATIVE
NITRITE: NEGATIVE
Protein, ur: NEGATIVE mg/dL
Specific Gravity, Urine: 1.011 (ref 1.005–1.030)
pH: 6 (ref 5.0–8.0)

## 2016-01-24 LAB — PROTIME-INR
INR: 1
INR: 1.05
PROTHROMBIN TIME: 13.2 s (ref 11.4–15.2)
PROTHROMBIN TIME: 13.7 s (ref 11.4–15.2)

## 2016-01-24 LAB — BASIC METABOLIC PANEL
ANION GAP: 8 (ref 5–15)
BUN: 11 mg/dL (ref 6–20)
CALCIUM: 8.4 mg/dL — AB (ref 8.9–10.3)
CO2: 22 mmol/L (ref 22–32)
Chloride: 107 mmol/L (ref 101–111)
Creatinine, Ser: 1.26 mg/dL — ABNORMAL HIGH (ref 0.61–1.24)
GFR, EST NON AFRICAN AMERICAN: 57 mL/min — AB (ref >60)
GLUCOSE: 92 mg/dL (ref 65–99)
POTASSIUM: 3.5 mmol/L (ref 3.5–5.1)
SODIUM: 137 mmol/L (ref 135–145)

## 2016-01-24 LAB — BLOOD GAS, ARTERIAL
ACID-BASE EXCESS: 0.2 mmol/L (ref 0.0–2.0)
Bicarbonate: 23.7 mmol/L (ref 20.0–28.0)
DRAWN BY: 46203
FIO2: 21
O2 SAT: 92.2 %
PATIENT TEMPERATURE: 98.6
PCO2 ART: 33.8 mmHg (ref 32.0–48.0)
PO2 ART: 62.5 mmHg — AB (ref 83.0–108.0)
pH, Arterial: 7.459 — ABNORMAL HIGH (ref 7.350–7.450)

## 2016-01-24 LAB — TYPE AND SCREEN
ABO/RH(D): O POS
ANTIBODY SCREEN: NEGATIVE

## 2016-01-24 LAB — APTT: aPTT: 62 seconds — ABNORMAL HIGH (ref 24–36)

## 2016-01-24 LAB — ABO/RH: ABO/RH(D): O POS

## 2016-01-24 LAB — HEPARIN LEVEL (UNFRACTIONATED)
HEPARIN UNFRACTIONATED: 0.2 [IU]/mL — AB (ref 0.30–0.70)
HEPARIN UNFRACTIONATED: 0.4 [IU]/mL (ref 0.30–0.70)

## 2016-01-24 LAB — TROPONIN I
TROPONIN I: 1.15 ng/mL — AB (ref <0.03)
TROPONIN I: 0.99 ng/mL — AB (ref <0.03)
TROPONIN I: 0.44 ng/mL — AB (ref <0.03)
Troponin I: 0.57 ng/mL (ref <0.03)

## 2016-01-24 LAB — MRSA PCR SCREENING: MRSA BY PCR: NEGATIVE

## 2016-01-24 MED ORDER — SODIUM CHLORIDE 0.9 % IV SOLN
INTRAVENOUS | Status: DC
  Filled 2016-01-24: qty 30

## 2016-01-24 MED ORDER — PLASMA-LYTE 148 IV SOLN
INTRAVENOUS | Status: DC
  Filled 2016-01-24: qty 2.5

## 2016-01-24 MED ORDER — SODIUM CHLORIDE 0.9 % IV SOLN
1250.0000 mg | INTRAVENOUS | Status: AC
  Administered 2016-01-25: 1250 mg via INTRAVENOUS
  Filled 2016-01-24: qty 1250

## 2016-01-24 MED ORDER — TEMAZEPAM 15 MG PO CAPS
15.0000 mg | ORAL_CAPSULE | Freq: Once | ORAL | Status: AC | PRN
  Administered 2016-01-25: 15 mg via ORAL
  Filled 2016-01-24: qty 1

## 2016-01-24 MED ORDER — NITROGLYCERIN IN D5W 200-5 MCG/ML-% IV SOLN
2.0000 ug/min | INTRAVENOUS | Status: DC
  Filled 2016-01-24: qty 250

## 2016-01-24 MED ORDER — CHLORHEXIDINE GLUCONATE CLOTH 2 % EX PADS
6.0000 | MEDICATED_PAD | Freq: Once | CUTANEOUS | Status: AC
  Administered 2016-01-25: 6 via TOPICAL

## 2016-01-24 MED ORDER — PHENYLEPHRINE HCL 10 MG/ML IJ SOLN
30.0000 ug/min | INTRAVENOUS | Status: DC
  Filled 2016-01-24: qty 2

## 2016-01-24 MED ORDER — SODIUM CHLORIDE 0.9 % IV SOLN
INTRAVENOUS | Status: DC
  Administered 2016-01-25: 69.8 mL/h via INTRAVENOUS
  Filled 2016-01-24: qty 40

## 2016-01-24 MED ORDER — MAGNESIUM SULFATE 50 % IJ SOLN
40.0000 meq | INTRAMUSCULAR | Status: DC
  Filled 2016-01-24: qty 10

## 2016-01-24 MED ORDER — DEXMEDETOMIDINE HCL IN NACL 400 MCG/100ML IV SOLN
0.1000 ug/kg/h | INTRAVENOUS | Status: DC
  Administered 2016-01-25: .3 ug/kg/h via INTRAVENOUS
  Filled 2016-01-24: qty 100

## 2016-01-24 MED ORDER — DOPAMINE-DEXTROSE 3.2-5 MG/ML-% IV SOLN
0.0000 ug/kg/min | INTRAVENOUS | Status: DC
  Filled 2016-01-24: qty 250

## 2016-01-24 MED ORDER — CHLORHEXIDINE GLUCONATE CLOTH 2 % EX PADS
6.0000 | MEDICATED_PAD | Freq: Once | CUTANEOUS | Status: AC
  Administered 2016-01-24: 6 via TOPICAL

## 2016-01-24 MED ORDER — DEXTROSE 5 % IV SOLN
1.5000 g | INTRAVENOUS | Status: AC
  Administered 2016-01-25: 750 mg via INTRAVENOUS
  Administered 2016-01-25: 1500 mg via INTRAVENOUS
  Filled 2016-01-24: qty 1.5

## 2016-01-24 MED ORDER — EPINEPHRINE HCL 1 MG/ML IJ SOLN
0.0000 ug/min | INTRAVENOUS | Status: DC
  Filled 2016-01-24: qty 4

## 2016-01-24 MED ORDER — METOPROLOL TARTRATE 12.5 MG HALF TABLET
12.5000 mg | ORAL_TABLET | Freq: Once | ORAL | Status: AC
  Administered 2016-01-25: 12.5 mg via ORAL
  Filled 2016-01-24: qty 1

## 2016-01-24 MED ORDER — BISACODYL 5 MG PO TBEC
5.0000 mg | DELAYED_RELEASE_TABLET | Freq: Once | ORAL | Status: AC
  Administered 2016-01-24: 5 mg via ORAL
  Filled 2016-01-24: qty 1

## 2016-01-24 MED ORDER — DEXTROSE 5 % IV SOLN
750.0000 mg | INTRAVENOUS | Status: DC
  Filled 2016-01-24: qty 750

## 2016-01-24 MED ORDER — POTASSIUM CHLORIDE CRYS ER 20 MEQ PO TBCR
40.0000 meq | EXTENDED_RELEASE_TABLET | Freq: Once | ORAL | Status: AC
  Administered 2016-01-24: 40 meq via ORAL
  Filled 2016-01-24: qty 2

## 2016-01-24 MED ORDER — SODIUM CHLORIDE 0.9 % IV SOLN
INTRAVENOUS | Status: DC
  Administered 2016-01-25: .5 [IU]/h via INTRAVENOUS
  Filled 2016-01-24: qty 2.5

## 2016-01-24 MED ORDER — CHLORHEXIDINE GLUCONATE 0.12 % MT SOLN
15.0000 mL | Freq: Once | OROMUCOSAL | Status: AC
  Administered 2016-01-25: 15 mL via OROMUCOSAL
  Filled 2016-01-24: qty 15

## 2016-01-24 MED ORDER — POTASSIUM CHLORIDE 2 MEQ/ML IV SOLN
80.0000 meq | INTRAVENOUS | Status: DC
  Filled 2016-01-24: qty 40

## 2016-01-24 NOTE — Progress Notes (Addendum)
ANTICOAGULATION CONSULT NOTE - Initial Consult  Pharmacy Consult for Heparin Indication: chest pain/ACS  No Known Allergies  Patient Measurements: Height: 6\' 1"  (185.4 cm) Weight: 176 lb 2.4 oz (79.9 kg) IBW/kg (Calculated) : 79.9    Vital Signs: Temp: 97.7 F (36.5 C) (09/10 0800) Temp Source: Oral (09/10 0800) BP: 136/84 (09/10 0800) Pulse Rate: 63 (09/10 0800)  Labs:  Recent Labs  01/23/16 1814 01/23/16 1826 01/23/16 2042 01/24/16 0135 01/24/16 0704  HGB 14.9 14.6  --  13.1  --   HCT 42.0 43.0  --  39.7  --   PLT 350  --   --  330  --   LABPROT  --   --   --  13.2  --   INR  --   --   --  1.00  --   HEPARINUNFRC  --   --   --   --  0.20*  CREATININE 1.33* 1.40*  --  1.26*  --   TROPONINI <0.03  --  0.18* 1.15* 0.99*    Estimated Creatinine Clearance: 64.3 mL/min (by C-G formula based on SCr of 1.26 mg/dL).   Medical History: Past Medical History:  Diagnosis Date  . Back pain   . COPD (chronic obstructive pulmonary disease) (HCC)   . Hypertension   . Stroke (cerebrum) [facility])      Assessment: 47 yoM admitted with STEMI, s/p emergent PCI that found to have multi-vessel disease, awaiting CVTS consult for, CABG. He is on IV heparin, heparin level 0.2, subtherapeutic on 1100 units/hr, CBC stable, no issues with infusion, No bleeding noted per RN.   Goal of Therapy:  Heparin level 0.3-0.7 units/ml Monitor platelets by anticoagulation protocol: Yes   Plan:  Increase heparin rate to 1300 units/hr F/u 6 hr heparin level at 1500 Daily heparin level and CBC F/u plan for surgery  Addendum:  Heparin level is 0.4 now, therapeutic on 1300 units/hr. CABG scheduled for tomorrow.  Plan: Continue heparin at current rate  Bayard Hugger, PharmD, BCPS  Clinical Pharmacist  Pager: [phone]

## 2016-01-24 NOTE — Progress Notes (Signed)
  Echocardiogram 2D Echocardiogram has been performed.  Delcie Roch 01/24/2016, 3:51 PM

## 2016-01-24 NOTE — Progress Notes (Signed)
Patient Name: Darryl George Date of Encounter: 01/24/2016  Hospital Problem List     Principal Problem:   ACS (acute coronary syndrome) [facility]) Active Problems:   Coronary artery disease involving native coronary artery of native heart without angina pectoris   COPD bronchitis   Chronic anticoagulation    Patient Profile     Darryl George is a 65 y.o. male with past medical history of prior stroke, hypertension, COPD, and tobacco abuse is here for chest pain.  Subtle EKG changes and was taken directly to the lab.   Subjective   No chest pain.  No SOB.   Inpatient Medications    . albuterol  5 mg Nebulization Once  . aspirin  81 mg Oral Daily  . atorvastatin  80 mg Oral q1800  . lisinopril  40 mg Oral Daily  . metoprolol tartrate  12.5 mg Oral BID  . sodium chloride flush  3 mL Intravenous Q12H    Vital Signs    Vitals:   01/24/16 0500 01/24/16 0600 01/24/16 0700 01/24/16 0800  BP: (!) 154/83 (!) 143/94 (!) 148/84 136/84  Pulse: 60 (!) 57 66 63  Resp: 18 18 15  (!) 23  Temp:    97.7 F (36.5 C)  TempSrc:    Oral  SpO2: 92% (!) 88% 90% 92%  Weight:      Height:        Intake/Output Summary (Last 24 hours) at 01/24/16 0848 Last data filed at 01/24/16 0800  Gross per 24 hour  Intake           776.09 ml  Output             1350 ml  Net          -573.91 ml   Filed Weights   01/23/16 1807 01/23/16 2030  Weight: 180 lb (81.6 kg) 176 lb 2.4 oz (79.9 kg)    Physical Exam    GEN: Well nourished, well developed, in no acute distress.  Neck: Supple, no JVD, carotid bruits, or masses. Cardiac: RRR, no rubs, or gallops. No clubbing, cyanosis, no edema.  Radials/DP/PT 2+ and equal bilaterally, right wrist without bruising or bleeding.  Respiratory:  Respirations  regular and unlabored, clear to auscultation bilaterally. GI: Soft, nontender, nondistended, BS + x 4. Neuro:  Strength and sensation are intact.   Labs    CBC  Recent Labs  01/23/16 1814  01/23/16 1826 01/24/16 0135  WBC 13.2*  --  10.5  NEUTROABS 7.9*  --   --   HGB 14.9 14.6 13.1  HCT 42.0 43.0 39.7  MCV 83.7  --  87.1  PLT 350  --  330   Basic Metabolic Panel  Recent Labs  01/23/16 1814 01/23/16 1826 01/24/16 0135  NA 136 139 137  K 3.3* 3.4* 3.5  CL 105 103 107  CO2 21*  --  22  GLUCOSE 128* 102* 92  BUN 16 14 11   CREATININE 1.33* 1.40* 1.26*  CALCIUM 9.1  --  8.4*   Liver Function Tests  Recent Labs  01/23/16 1814  AST 20  ALT 16*  ALKPHOS 110  BILITOT 0.5  PROT 6.8  ALBUMIN 3.8   No results for input(s): LIPASE, AMYLASE in the last 72 hours. Cardiac Enzymes  Recent Labs  01/23/16 2042 01/24/16 0135 01/24/16 0704  TROPONINI 0.18* 1.15* 0.99*   BNP Invalid input(s): POCBNP D-Dimer No results for input(s): DDIMER in the last 72 hours. Hemoglobin A1C No  results for input(s): HGBA1C in the last 72 hours. Fasting Lipid Panel No results for input(s): CHOL, HDL, LDLCALC, TRIG, CHOLHDL, LDLDIRECT in the last 72 hours. Thyroid Function Tests No results for input(s): TSH, T4TOTAL, T3FREE, THYROIDAB in the last 72 hours.  Invalid input(s): FREET3   CATH  1st Diag lesion, 90 %stenosed.  Ost 2nd Diag lesion, 90 %stenosed.  Mid LAD lesion, 85 %stenosed.  Prox LAD lesion, 80 %stenosed.  Ramus lesion, 60 %stenosed.  Prox Cx to Mid Cx lesion, 50 %stenosed.  Prox RCA lesion, 80 %stenosed.  The left ventricular ejection fraction is 55-65% by visual estimate.  The left ventricular systolic function is normal.  LV end diastolic pressure is normal.  Telemetry    NSR, 3 beats of VT  ECG    NSR, rate 64, axis WNL, intervals WNL, no acute ST T wave changes.   Radiology    No results found.  Assessment & Plan    ACS:  Culprit is the RCA with thrombus but with 3 vessel CAD.  Pain free.  Talked with [facility] and they will see in consult.  Continue IV NTG and heparin.   RISK REDUCTION:  Check fasting lipid profile.   TOBACCO:   Educated.   He doesn't think that cigarettes necessarily contribute to the heart disease.   Signed, Rollene RotundaJames Kyi Romanello, MD  01/24/2016, 8:48 AM

## 2016-01-24 NOTE — Progress Notes (Signed)
Pre-op Cardiac Surgery  Carotid Findings:  1-39% ICA plaquing. Vertebral artery flow is antegrade.   Upper Extremity Right Left  Brachial Pressures 171T 153T  Radial Waveforms T T  Ulnar Waveforms T T  Palmar Arch (Allen's Test) WNL Doppler signal obliterates with radial compression and remains normal with ulnar compression.   Findings:      Lower  Extremity Right Left  Dorsalis Pedis    Anterior Tibial T T  Posterior Tibial T T  Ankle/Brachial Indices      Findings:

## 2016-01-24 NOTE — Consult Note (Addendum)
[address]       [place] [address]             [phone]        TRINDON DORTON [facility] Medical Record #[id number] Date of Birth: 10-20-1950  Referring: No ref. provider found Primary Care: PROVIDER NOT IN SYSTEM  Chief Complaint:    Chief Complaint  Patient presents with  . Shortness of Breath   History of Present Illness:    The patient is a 65 year old male who presented to the emergency department yesterday after developing acute shortness of breath while mowing his grass. In addition to the shortness of breath, he developed bilateral arm pain that was described as severe and radiated a 9 on a scale of 1-10. The patient has a history of hypertension and COPD as well as previous CVA. He also smokes 1 pack per day of cigarettes. He has been on chronic Coumadin since the stroke in March that was found on MRI during an evaluation for dizziness. Initial EKG done in the emergency department was suspicious for inferior myocardial infarction. He was initially treated with sublingual nitroglycerin and aspirin and pain did decrease to a 2. He was also started on heparin and cardiology consultation was obtained with Dr. Verdis Prime. A code STEMI was called as he did have some elevation in lead 2. He was taken to the catheterization lab where he was found to have severe multivessel coronary artery disease. There was a heavy thrombus burden in the proximal RCA and this is felt to be the culprit vessel for the patient's ST elevation MI. It appears that this spontaneously recanalized. There was also noted to be severe LAD disease with multifocal proximal to mid artery 85% stenosis and an 85% stenosis in the large diagonal. There is also an 80% obstructive lesion in the second moderately sized diagonal vessel. There is a 50% ostial lesion in the ramus coronary artery. Additionally there was a 40-50% proximal to mid circumflex lesion. Due to these findings cardiothoracic surgical  consultation is obtained for opinion on coronary artery surgical revascularization. The full catheterization report is listed below. Peak Troponin is 1.15.     Current Activity/ Functional Status: Patient is independent with mobility/ambulation, transfers, ADL's, IADL's.   Zubrod Score: At the time of surgery this patient's most appropriate activity status/level should be described as: [x]     0    Normal activity, no symptoms []     1    Restricted in physical strenuous activity but ambulatory, able to do out light work []     2    Ambulatory and capable of self care, unable to do work activities, up and about                 more than 50%  Of the time                            []     3    Only limited self care, in bed greater than 50% of waking hours []     4    Completely disabled, no self care, confined to bed or chair []     5    Moribund  Past Medical History:  Diagnosis Date  . Back pain   . COPD (chronic obstructive pulmonary disease) (HCC)   . Hypertension   . Stroke (cerebrum) [facility])     Past Surgical History:  Procedure Laterality Date  . EYE SURGERY    . HERNIA REPAIR    . Stroke      History  Smoking Status  . Current Every Day Smoker  . Packs/day: 1.00  . Types: Cigarettes  Smokeless Tobacco  . Never Used    History  Alcohol Use No    Comment: once a month    Social History   Social History  . Marital status: Divorced    Spouse name: N/A  . Number of children: N/A  . Years of education: N/A   Occupational History  . Not on file.   Social History Main Topics  . Smoking status: Current Every Day Smoker    Packs/day: 1.00    Types: Cigarettes  . Smokeless tobacco: Never Used  . Alcohol use No     Comment: once a month  . Drug use: No  . Sexual activity: Not on file   Other Topics Concern  . Not on file   Social History Narrative  . No narrative on file    No Known Allergies  Current Facility-Administered Medications  Medication Dose  Route Frequency Provider Last Rate Last Dose  . 0.9 %  sodium chloride infusion  250 mL Intravenous PRN Henry W Smith, MD      . acetaminophen (TYLENOL) tablet 650 mg  650 mg Oral Q4H PRN Lyn RecordsHenry W Smith, MD      . albuterol (PROVENTIL) (2.5 MG/3ML) 0.083% nebulizer solution 3 mLLyn Records  3 mL Inhalation Q6H PRN Arlester MarkerKamal H Henderson, MD      . albuterol (PROVENTIL) (2.5 MG/3ML) 0.083% nebulizer solution 5 mg  5 mg Nebulization Once Vanetta MuldersScott Zackowski, MD      . aspirin chewable tablet 81 mg  81 mg Oral Daily Lyn RecordsHenry W Smith, MD   81 mg at 01/24/16 0942  . atorvastatin (LIPITOR) tablet 80 mg  80 mg Oral q1800 Lyn RecordsHenry W Smith, MD      . heparin ADULT infusion 100 units/mL (25000 units/26850mL sodium chloride 0.45%)  1,300 Units/hr Intravenous Continuous Robinette HainesMei W Bell, RPH 13 mL/hr at 01/24/16 0900 1,300 Units/hr at 01/24/16 0900  . lisinopril (PRINIVIL,ZESTRIL) tablet 40 mg  40 mg Oral Daily Arlester MarkerKamal H Henderson, MD   40 mg at 01/24/16 0942  . metoprolol tartrate (LOPRESSOR) tablet 12.5 mg  12.5 mg Oral BID Arlester MarkerKamal H Henderson, MD   12.5 mg at 01/24/16 0942  . nitroGLYCERIN 50 mg in dextrose 5 % 250 mL (0.2 mg/mL) infusion  10-50 mcg/min Intravenous Titrated Lyn RecordsHenry W Smith, MD 6 mL/hr at 01/24/16 0900 20 mcg/min at 01/24/16 0900  . ondansetron (ZOFRAN) injection 4 mg  4 mg Intravenous Q6H PRN Lyn RecordsHenry W Smith, MD      . sodium chloride flush (NS) 0.9 % injection 3 mL  3 mL Intravenous Q12H Lyn RecordsHenry W Smith, MD   3 mL at 01/24/16 0951  . sodium chloride flush (NS) 0.9 % injection 3 mL  3 mL Intravenous PRN Lyn RecordsHenry W Smith, MD        Prescriptions Prior to Admission  Medication Sig Dispense Refill Last Dose  . albuterol (PROVENTIL HFA;VENTOLIN HFA) 108 (90 Base) MCG/ACT inhaler Inhale into the lungs every 6 (six) hours as needed for wheezing or shortness of breath.     . diltiazem (DILACOR XR) 240 MG 24 hr capsule Take 240 mg by mouth daily.     Marland Kitchen. lisinopril (PRINIVIL,ZESTRIL) 40 MG tablet Take 40 mg by mouth daily.     Marland Kitchen. warfarin  (  COUMADIN) 1 MG tablet Take 1 mg by mouth daily.   Unknown at Unknown time  . [DISCONTINUED] ibuprofen (ADVIL,MOTRIN) 200 MG tablet Take 200 mg by mouth every 6 (six) hours as needed (as needed).     . [DISCONTINUED] methocarbamol (ROBAXIN) 500 MG tablet Take 1 tablet (500 mg total) by mouth 3 (three) times daily. 21 tablet 0     Family History  Problem Relation Age of Onset  . Cancer Mother   . Heart failure Mother   . Kidney disease Mother   . Aneurysm Father   . Hypertension Father   . Cancer Other   . Breast cancer Sister      Review of Systems:     Cardiac Review of Systems: Y or N  Chest Pain Cove.Etienne    ]  Resting SOB [ n  ] Exertional SOB  Cove.Etienne  ]  Orthopnea [ n ]   Pedal Edema [n   ]    Palpitations Milo.Brash  ] Syncope  [n  ]   Presyncope [ y  ]  General Review of Systems: [Y] = yes [  ]=no Constitional: recent weight change [n  ]; anorexia [n  ]; fatigue [ y ]; nausea [ n ]; night sweats [ n ]; fever [ n ]; or chills [  n]                                                               Dental: poor dentition[ y ]; Last Dentist visit: 40 years ago  Eye : blurred vision Milo.Brash  ]; diplopia [  n ]; vision changes [n  ];  Amaurosis fugax[ n ];bilat cateract surgery Resp: cough [ y ];  wheezing[ n ];  hemoptysis[ n ]; shortness of breath[ y ]; paroxysmal nocturnal dyspnea[ n ]; dyspnea on exertion[y  ]; or orthopnea[n  ];  GI:  gallstones[ n ], vomiting[ n ];  dysphagia[ n ]; melena[ n ];  hematochezia [  ]; heartburn[ n ];   Hx of  Colonoscopy[ y ];+ hemorhoids with bright red blood GU: kidney stones [ n ]; hematuria[ n ];   dysuria [ n ];  nocturia[ y ];  history of     obstruction [ n ]; urinary frequency [ y ]             Skin: rash, swelling[n  ];, hair loss[ nn ];  peripheral edeman  ];  or itching[ n ]; Musculosketetal: myalgias[n  ];  joint swelling[ y ];  joint erythema[n  ];  joint pain[  ];  back pain[  ];  Heme/Lymph: bruising[ y ];  bleeding[n  ];  anemia[n  ];  Neuro: TIA[n  ];   headaches[n  ];  stroke[y  ];  vertigo[ n ];  seizures[ n ];   paresthesias[n  ];  difficulty walking[n  ];  Psych:depression[y  ]; anxiety[ n ];  Endocrine: diabetes[ n ];  thyroid dysfunction[  n];  Immunizations: Flu [  ]; Pneumococcal[  ];  Other:  Physical Exam: BP (!) 150/82   Pulse (!) 59   Temp 97.7 F (36.5 C) (Oral)   Resp 20   Ht 6\' 1"  (1.854 m)   Wt 176 lb 2.4 oz (79.9 kg)   SpO2 Marland Kitchen)  89%   BMI 23.24 kg/m    General appearance: alert, cooperative and no distress Head: Normocephalic, without obvious abnormality, atraumatic Neck: no adenopathy, no carotid bruit, no JVD, supple, symmetrical, trachea midline and thyroid not enlarged, symmetric, no tenderness/mass/nodules Lymph nodes: Cervical, supraclavicular, and axillary nodes normal. Resp: clear to auscultation bilaterally Back: symmetric, no curvature. ROM normal. No CVA tenderness. Cardio: regular rate and rhythm, S1, S2 normal, no murmur, click, rub or gallop GI: soft, non-tender; bowel sounds normal; no masses,  no organomegaly Extremities: extremities normal, atraumatic, no cyanosis or edema Neurologic: Grossly normal Pulses + DP/PT on right, absent PT on left   Diagnostic Studies & Laboratory data: Cardiac Cath    Conclusion     1st Diag lesion, 90 %stenosed.  Ost 2nd Diag lesion, 90 %stenosed.  Mid LAD lesion, 85 %stenosed.  Prox LAD lesion, 80 %stenosed.  Ramus lesion, 60 %stenosed.  Prox Cx to Mid Cx lesion, 50 %stenosed.  Prox RCA lesion, 80 %stenosed.  The left ventricular ejection fraction is 55-65% by visual estimate.  The left ventricular systolic function is normal.  LV end diastolic pressure is normal.    Aborted inferior ST elevation myocardial infarction with recanalization of the proximal RCA to a thrombus containing 85% segmental stenosis. TIMI grade 3 flow is noted. Based upon symptoms and the duration of occlusion was less than 45 minutes.  Severe diffuse proximal to mid  LAD 80-90% stenosis in severity. This segment of the LAD forms bifurcation lesions with 2 diagonal branches each of which are large. The first diagonal contains segmental 85% stenosis in the proximal segment. The second diagonal contains ostial 90% stenosis.  50% stenosis in the ostial ramus and 50% stenosis in the mid circumflex.  Normal left ventricular systolic function with EF 65% with normal LVEDP.  RECOMMENDATIONS:   IV heparin  High intensity statin therapy  Smoking cessation  IV nitroglycerin  Aspirin  A single bolus of Aggrastat was given  TCTS evaluation for consideration of multivessel CABG.   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 Intravenous sedation with Versed and fentanyl was administered. 1% Xylocaine was infiltrated to achieve local analgesia. A double wall stick with an angiocath was utilized to obtain intra-arterial access. The modified Seldinger technique was used to place a 18F " Slender" sheath in the right radial artery. Weight based heparin was administered. Coronary angiography was done using 5 F catheters. Right coronary angiography was performed with a JR4. Left ventricular hemodymic recordings and angiography was done using the JR 4 catheter and hand injection. Left coronary angiography was performed with a JL 3.5 cm.  Hemostasis was achieved using a pneumatic band.  During this procedure the patient is administered a total of Versed 1 mg and Fentanyl 50 mg to achieve and maintain moderate conscious sedation. The patient's heart rate, blood pressure, and oxygen saturation are monitored continuously during the procedure. The period of conscious  sedation is 21 minutes, of which I was present face-to-face 100% of this time.   Estimated blood loss <50 mL. . During this procedure the patient was administered the following to achieve and maintain moderate conscious sedation: Versed 1 mg, Fentanyl 50 mcg, while the patient's heart rate, blood pressure, and oxygen saturation were continuously monitored.  The period of conscious sedation was 21 minutes, of which I was present face-to-face 100% of this time.    Coronary Findings   Dominance: Right  Left Anterior Descending  Prox LAD lesion, 80% stenosed.  Mid LAD lesion, 85% stenosed.  First Diagonal Branch  1st Diag lesion, 90% stenosed. The lesion is eccentric.  Second Diagonal [employer] 2nd Diag lesion, 90% stenosed.  Ramus Intermedius  Ramus lesion, 60% stenosed.  Left Circumflex  Prox Cx to Mid Cx lesion, 50% stenosed.  Second Obtuse Marginal Branch  Vessel is small in size.  Right Coronary Artery  Prox RCA lesion, 80% stenosed.  Wall Motion   Resting               Left Heart   Left Ventricle The left ventricular size is normal. The left ventricular systolic function is normal. LV end diastolic pressure is normal. The left ventricular ejection fraction is 55-65% by visual estimate. No regional wall motion abnormalities.    Coronary Diagrams   Diagnostic Diagram     Implants     No implant documentation for this case.  PACS Images   Show images for Cardiac catheterization   Link to Procedure Log   Procedure Log    Hemo Data   Flowsheet Row Most Recent Value  AO Systolic Pressure 140 mmHg  AO Diastolic Pressure 67 mmHg  AO Mean 96 mmHg  LV Systolic Pressure 138 mmHg  LV Diastolic Pressure 4 mmHg  LV EDP 12 mmHg  Arterial Occlusion Pressure Extended Systolic Pressure 137 mmHg  Arterial Occlusion Pressure Extended Diastolic Pressure 69 mmHg  Arterial Occlusion Pressure Extended Mean Pressure 95 mmHg  Left Ventricular Apex Extended Systolic Pressure 137  mmHg  Left Ventricular Apex Extended Diastolic Pressure 6 mmHg  Left Ventricular Apex Extended EDP Pressure 13 mmHg       Recent Radiology Findings:   No results found.   I have independently reviewed the above radiologic studies.  Recent Lab Findings: Lab Results  Component Value Date   WBC 10.5 01/24/2016   HGB 13.1 01/24/2016   HCT 39.7 01/24/2016   PLT 330 01/24/2016   GLUCOSE 92 01/24/2016   ALT 16 (L) 01/23/2016   AST 20 01/23/2016   NA 137 01/24/2016   K 3.5 01/24/2016   CL 107 01/24/2016   CREATININE 1.26 (H) 01/24/2016   BUN 11 01/24/2016   CO2 22 01/24/2016   INR 1.00 01/24/2016      Assessment / Plan:  STEMI with severe CAD    Patient Active Problem List   Diagnosis Date Noted  . Coronary artery disease involving native coronary artery of native heart without angina pectoris 01/23/2016  . ACS (acute coronary syndrome) (HCC) 01/23/2016  . COPD bronchitis 01/23/2016  . Chronic anticoagulation 01/23/2016  H/O CVA Tobacco abuse >50 year ARF- improving  Surgeon to evaluate Will order PFT's with significant COPD/tobacco abuse preop carotid and ABI's   GOLD,WAYNE E, PA-C 01/24/2016 10:30 AM   I have seen and examined the patient and agree with the assessment and plan as outlined above.  Patient is a 65 year old male with no previous history of coronary artery disease but risk factors notable for history of hypertension and long standing tobacco abuse with COPD who describes a 1 month history of worsening symptoms of exertional shortness of breath and sudden onset of severe substernal chest pain associated with shortness of breath yesterday afternoon after he attempted to mow his lawn. Symptoms of chest pain and  shortness of breath persisted for more than an hour, prompting him to present to the emergency department at [facility].  The patient states that his chest discomfort resolved fairly quickly following administration of sublingual nitroglycerin on his arrival  to [facility].  Initial EKGs revealed mild ST segment elevation in the inferior leads.  Baseline troponin was negative but serial troponin levels have peaked at 1.15. The patient was transferred to [facility] where he was taken directly to the cardiac cath lab by Dr. Katrinka Blazing.  Catheterization reveals severe three-vessel coronary artery disease with subtotal occlusion of the proximal right coronary artery associated with acute thrombus, consistent with an aborted acute ST segment elevation myocardial infarction. The patient has remained pain-free and clinically stable ever since on intravenous nitroglycerin and heparin.  I have personally reviewed the patient's diagnostic cardiac catheterization. The patient has severe three-vessel coronary artery disease with relatively good target vessels for grafting.  I agree the patient would best be treated with surgical revascularization.  Transthoracic echocardiogram has been ordered to assess left ventricular function.  The patient does not have a murmur on physical exam and has no clinical history of congestive heart failure.  Risks associated with surgery should be relatively low although perhaps slightly increased because of the patient's history of long-standing tobacco abuse with likely significant COPD.  In addition, the patient was reportedly taking warfarin at home for treatment of a subclinical stroke that was identified on previous MRI of the brain.  However, the patient admits that he has been only partially compliant with warfarin therapy, taking it only occasionally. He hasn't had a prothrombin time checked for at least a few months. His baseline INR was normal (1.0) at the time of hospital admission yesterday.  I have reviewed the indications, risks, and potential benefits of coronary artery bypass grafting with the patient and his close friend.  Alternative treatment strategies have been discussed, including the relative risks, benefits and long  term prognosis associated with medical therapy, percutaneous coronary intervention, and surgical revascularization.  The patient understands and accepts all potential associated risks of surgery including but not limited to risk of death, stroke or other neurologic complication, myocardial infarction, congestive heart failure, respiratory failure, renal failure, bleeding requiring blood transfusion and/or reexploration, aortic dissection or other major vascular complication, arrhythmia, heart block or bradycardia requiring permanent pacemaker, pneumonia, pleural effusion, wound infection, pulmonary embolus or other thromboembolic complication, chronic pain or other delayed complications related to median sternotomy, or the late recurrence of symptomatic ischemic heart disease and/or congestive heart failure.  The importance of long term risk modification have been emphasized.  All questions answered.  We' will proceed with transthoracic echocardiogram, carotid duplex scan, and bedside pulmonary function tests.  We'll plan to proceed with coronary artery bypass grafting tomorrow by Dr. Donata Clay.   I spent in excess of 90 minutes during the conduct of this hospital encounter and >50% of this time involved direct face-to-face encounter with the patient for counseling and/or coordination of their care.    Purcell Nails, MD 01/24/2016 1:25 PM   I have examined Mr Higinbotham in preop holding and personally reviewed his coronary arteriograms and trans thoracic echo. Agree with above assessment by Dr Cornelius Moras and recommendation to proceed with CABG. Patient voices his understanding and consents to CABG.

## 2016-01-24 NOTE — Anesthesia Preprocedure Evaluation (Addendum)
Anesthesia Evaluation  Patient identified by MRN, date of birth, ID band Patient awake    Reviewed: Allergy & Precautions, NPO status , Patient's Chart, lab work & pertinent test results  History of Anesthesia Complications Negative for: history of anesthetic complications  Airway Mallampati: II  TM Distance: >3 FB Neck ROM: Full    Dental  (+) Teeth Intact, Dental Advisory Given   Pulmonary COPD,  COPD inhaler, Current Smoker,    Pulmonary exam normal breath sounds clear to auscultation       Cardiovascular hypertension, Pt. on medications + CAD  Normal cardiovascular exam Rhythm:Regular Rate:Normal  3v CAD 01/24/16 Echo: Study Conclusions  - Left ventricle: The cavity size was normal. Wall thickness was normal. Systolic function was normal. The estimated ejection fraction was in the range of 55% to 60%. Doppler parameters are consistent with abnormal left ventricular relaxation (grade 1 diastolic dysfunction). - Left atrium: The atrium was mildly dilated.   Neuro/Psych CVA negative psych ROS   GI/Hepatic negative GI ROS, Neg liver ROS,   Endo/Other  negative endocrine ROS  Renal/GU negative Renal ROS     Musculoskeletal negative musculoskeletal ROS (+)   Abdominal   Peds  Hematology  (+) Blood dyscrasia (Warfarin), ,   Anesthesia Other Findings Day of surgery medications reviewed with the patient.  Reproductive/Obstetrics                           Anesthesia Physical Anesthesia Plan  ASA: IV  Anesthesia Plan: General   Post-op Pain Management:    Induction: Intravenous  Airway Management Planned: Oral ETT  Additional Equipment: Arterial line, CVP, Ultrasound Guidance Line Placement, PA Cath and 3D TEE  Intra-op Plan:   Post-operative Plan: Post-operative intubation/ventilation  Informed Consent: I have reviewed the patients History and Physical, chart, labs and discussed  the procedure including the risks, benefits and alternatives for the proposed anesthesia with the patient or authorized representative who has indicated his/her understanding and acceptance.   Dental advisory given  Plan Discussed with: CRNA  Anesthesia Plan Comments: (Risks/benefits of general anesthesia discussed with patient including risk of damage to teeth, lips, gum, and tongue, nausea/vomiting, allergic reactions to medications, and the possibility of heart attack, stroke and death.  All patient questions answered.  Patient wishes to proceed.)       Anesthesia Quick Evaluation

## 2016-01-25 ENCOUNTER — Encounter (HOSPITAL_COMMUNITY): Payer: Self-pay | Admitting: Certified Registered Nurse Anesthetist

## 2016-01-25 ENCOUNTER — Inpatient Hospital Stay (HOSPITAL_COMMUNITY): Payer: Medicare Other | Admitting: Anesthesiology

## 2016-01-25 ENCOUNTER — Inpatient Hospital Stay (HOSPITAL_COMMUNITY): Payer: Medicare Other

## 2016-01-25 ENCOUNTER — Inpatient Hospital Stay (HOSPITAL_COMMUNITY)
Admission: EM | Disposition: A | Discharge status: Skilled Nursing Facility | Payer: Self-pay | Source: Home / Self Care | Attending: Cardiothoracic Surgery

## 2016-01-25 DIAGNOSIS — I214 Non-ST elevation (NSTEMI) myocardial infarction: Secondary | ICD-10-CM

## 2016-01-25 DIAGNOSIS — I25110 Atherosclerotic heart disease of native coronary artery with unstable angina pectoris: Secondary | ICD-10-CM

## 2016-01-25 HISTORY — PX: CORONARY ARTERY BYPASS GRAFT: SHX141

## 2016-01-25 HISTORY — PX: TEE WITHOUT CARDIOVERSION: SHX5443

## 2016-01-25 LAB — POCT I-STAT 3, ART BLOOD GAS (G3+)
ACID-BASE DEFICIT: 3 mmol/L — AB (ref 0.0–2.0)
ACID-BASE DEFICIT: 2 mmol/L (ref 0.0–2.0)
ACID-BASE DEFICIT: 4 mmol/L — AB (ref 0.0–2.0)
Acid-base deficit: 3 mmol/L — ABNORMAL HIGH (ref 0.0–2.0)
Acid-base deficit: 3 mmol/L — ABNORMAL HIGH (ref 0.0–2.0)
BICARBONATE: 23.8 mmol/L (ref 20.0–28.0)
BICARBONATE: 23.7 mmol/L (ref 20.0–28.0)
BICARBONATE: 23.2 mmol/L (ref 20.0–28.0)
Bicarbonate: 22.4 mmol/L (ref 20.0–28.0)
Bicarbonate: 21 mmol/L (ref 20.0–28.0)
O2 SAT: 100 %
O2 SAT: 91 %
O2 SAT: 91 %
O2 Saturation: 100 %
O2 Saturation: 92 %
PCO2 ART: 48.2 mmHg — AB (ref 32.0–48.0)
PCO2 ART: 42.3 mmHg (ref 32.0–48.0)
PCO2 ART: 42.2 mmHg (ref 32.0–48.0)
PH ART: 7.36 (ref 7.350–7.450)
PO2 ART: 320 mmHg — AB (ref 83.0–108.0)
PO2 ART: 65 mmHg — AB (ref 83.0–108.0)
Patient temperature: 35.8
Patient temperature: 36.7
TCO2: 25 mmol/L (ref 0–100)
TCO2: 25 mmol/L (ref 0–100)
TCO2: 25 mmol/L (ref 0–100)
TCO2: 24 mmol/L (ref 0–100)
TCO2: 22 mmol/L (ref 0–100)
pCO2 arterial: 44 mmHg (ref 32.0–48.0)
pCO2 arterial: 37.4 mmHg (ref 32.0–48.0)
pH, Arterial: 7.302 — ABNORMAL LOW (ref 7.350–7.450)
pH, Arterial: 7.34 — ABNORMAL LOW (ref 7.350–7.450)
pH, Arterial: 7.341 — ABNORMAL LOW (ref 7.350–7.450)
pH, Arterial: 7.331 — ABNORMAL LOW (ref 7.350–7.450)
pO2, Arterial: 181 mmHg — ABNORMAL HIGH (ref 83.0–108.0)
pO2, Arterial: 61 mmHg — ABNORMAL LOW (ref 83.0–108.0)
pO2, Arterial: 68 mmHg — ABNORMAL LOW (ref 83.0–108.0)

## 2016-01-25 LAB — CBC
HCT: 39.2 % (ref 39.0–52.0)
HCT: 32.6 % — ABNORMAL LOW (ref 39.0–52.0)
HEMATOCRIT: 37 % — AB (ref 39.0–52.0)
Hemoglobin: 13.2 g/dL (ref 13.0–17.0)
Hemoglobin: 12.4 g/dL — ABNORMAL LOW (ref 13.0–17.0)
Hemoglobin: 10.8 g/dL — ABNORMAL LOW (ref 13.0–17.0)
MCH: 29.1 pg (ref 26.0–34.0)
MCH: 28.8 pg (ref 26.0–34.0)
MCH: 28.6 pg (ref 26.0–34.0)
MCHC: 33.7 g/dL (ref 30.0–36.0)
MCHC: 33.5 g/dL (ref 30.0–36.0)
MCHC: 33.1 g/dL (ref 30.0–36.0)
MCV: 86.3 fL (ref 78.0–100.0)
MCV: 85.8 fL (ref 78.0–100.0)
MCV: 86.5 fL (ref 78.0–100.0)
Platelets: 316 10*3/uL (ref 150–400)
Platelets: 238 10*3/uL (ref 150–400)
Platelets: 237 10*3/uL (ref 150–400)
RBC: 4.54 MIL/uL (ref 4.22–5.81)
RBC: 4.31 MIL/uL (ref 4.22–5.81)
RBC: 3.77 MIL/uL — ABNORMAL LOW (ref 4.22–5.81)
RDW: 13.5 % (ref 11.5–15.5)
RDW: 13.5 % (ref 11.5–15.5)
RDW: 13.6 % (ref 11.5–15.5)
WBC: 10.9 10*3/uL — ABNORMAL HIGH (ref 4.0–10.5)
WBC: 22.3 10*3/uL — ABNORMAL HIGH (ref 4.0–10.5)
WBC: 15.3 10*3/uL — ABNORMAL HIGH (ref 4.0–10.5)

## 2016-01-25 LAB — POCT I-STAT, CHEM 8
BUN: 12 mg/dL (ref 6–20)
BUN: 11 mg/dL (ref 6–20)
BUN: 9 mg/dL (ref 6–20)
BUN: 11 mg/dL (ref 6–20)
BUN: 10 mg/dL (ref 6–20)
BUN: 10 mg/dL (ref 6–20)
BUN: 11 mg/dL (ref 6–20)
CALCIUM ION: 1.26 mmol/L (ref 1.15–1.40)
CALCIUM ION: 1.06 mmol/L — AB (ref 1.15–1.40)
CALCIUM ION: 1.02 mmol/L — AB (ref 1.15–1.40)
CALCIUM ION: 1.02 mmol/L — AB (ref 1.15–1.40)
CHLORIDE: 100 mmol/L — AB (ref 101–111)
CHLORIDE: 95 mmol/L — AB (ref 101–111)
CHLORIDE: 98 mmol/L — AB (ref 101–111)
CREATININE: 1 mg/dL (ref 0.61–1.24)
CREATININE: 1 mg/dL (ref 0.61–1.24)
CREATININE: 1 mg/dL (ref 0.61–1.24)
Calcium, Ion: 1.25 mmol/L (ref 1.15–1.40)
Calcium, Ion: 1 mmol/L — ABNORMAL LOW (ref 1.15–1.40)
Calcium, Ion: 1.2 mmol/L (ref 1.15–1.40)
Chloride: 102 mmol/L (ref 101–111)
Chloride: 98 mmol/L — ABNORMAL LOW (ref 101–111)
Chloride: 98 mmol/L — ABNORMAL LOW (ref 101–111)
Chloride: 102 mmol/L (ref 101–111)
Creatinine, Ser: 1.2 mg/dL (ref 0.61–1.24)
Creatinine, Ser: 1.1 mg/dL (ref 0.61–1.24)
Creatinine, Ser: 1 mg/dL (ref 0.61–1.24)
Creatinine, Ser: 1.2 mg/dL (ref 0.61–1.24)
GLUCOSE: 94 mg/dL (ref 65–99)
GLUCOSE: 114 mg/dL — AB (ref 65–99)
GLUCOSE: 111 mg/dL — AB (ref 65–99)
GLUCOSE: 131 mg/dL — AB (ref 65–99)
Glucose, Bld: 84 mg/dL (ref 65–99)
Glucose, Bld: 85 mg/dL (ref 65–99)
Glucose, Bld: 138 mg/dL — ABNORMAL HIGH (ref 65–99)
HCT: 36 % — ABNORMAL LOW (ref 39.0–52.0)
HCT: 29 % — ABNORMAL LOW (ref 39.0–52.0)
HCT: 29 % — ABNORMAL LOW (ref 39.0–52.0)
HEMATOCRIT: 36 % — AB (ref 39.0–52.0)
HEMATOCRIT: 27 % — AB (ref 39.0–52.0)
HEMATOCRIT: 27 % — AB (ref 39.0–52.0)
HEMATOCRIT: 29 % — AB (ref 39.0–52.0)
HEMOGLOBIN: 12.2 g/dL — AB (ref 13.0–17.0)
HEMOGLOBIN: 9.9 g/dL — AB (ref 13.0–17.0)
HEMOGLOBIN: 9.9 g/dL — AB (ref 13.0–17.0)
Hemoglobin: 12.2 g/dL — ABNORMAL LOW (ref 13.0–17.0)
Hemoglobin: 9.2 g/dL — ABNORMAL LOW (ref 13.0–17.0)
Hemoglobin: 9.9 g/dL — ABNORMAL LOW (ref 13.0–17.0)
Hemoglobin: 9.2 g/dL — ABNORMAL LOW (ref 13.0–17.0)
POTASSIUM: 4.6 mmol/L (ref 3.5–5.1)
POTASSIUM: 4.5 mmol/L (ref 3.5–5.1)
POTASSIUM: 4.7 mmol/L (ref 3.5–5.1)
POTASSIUM: 4.7 mmol/L (ref 3.5–5.1)
Potassium: 4.1 mmol/L (ref 3.5–5.1)
Potassium: 5.1 mmol/L (ref 3.5–5.1)
Potassium: 5.5 mmol/L — ABNORMAL HIGH (ref 3.5–5.1)
SODIUM: 136 mmol/L (ref 135–145)
SODIUM: 136 mmol/L (ref 135–145)
SODIUM: 137 mmol/L (ref 135–145)
Sodium: 131 mmol/L — ABNORMAL LOW (ref 135–145)
Sodium: 131 mmol/L — ABNORMAL LOW (ref 135–145)
Sodium: 134 mmol/L — ABNORMAL LOW (ref 135–145)
Sodium: 136 mmol/L (ref 135–145)
TCO2: 24 mmol/L (ref 0–100)
TCO2: 25 mmol/L (ref 0–100)
TCO2: 26 mmol/L (ref 0–100)
TCO2: 25 mmol/L (ref 0–100)
TCO2: 24 mmol/L (ref 0–100)
TCO2: 24 mmol/L (ref 0–100)
TCO2: 22 mmol/L (ref 0–100)

## 2016-01-25 LAB — PROTIME-INR
INR: 1.1
INR: 1.29
Prothrombin Time: 14.3 seconds (ref 11.4–15.2)
Prothrombin Time: 16.1 seconds — ABNORMAL HIGH (ref 11.4–15.2)

## 2016-01-25 LAB — CREATININE, SERUM
Creatinine, Ser: 1.26 mg/dL — ABNORMAL HIGH (ref 0.61–1.24)
GFR calc Af Amer: >60 mL/min (ref >60)
GFR calc non Af Amer: 57 mL/min — ABNORMAL LOW (ref >60)

## 2016-01-25 LAB — LIPID PANEL
Cholesterol: 140 mg/dL (ref 0–200)
HDL: 32 mg/dL — ABNORMAL LOW (ref >40)
LDL CALC: 85 mg/dL (ref 0–99)
TRIGLYCERIDES: 113 mg/dL (ref <150)
Total CHOL/HDL Ratio: 4.4 RATIO
VLDL: 23 mg/dL (ref 0–40)

## 2016-01-25 LAB — VAS US DOPPLER PRE CABG
LCCADSYS: 70 cm/s
LCCAPDIAS: 16 cm/s
LEFT ECA DIAS: -14 cm/s
LEFT VERTEBRAL DIAS: -4 cm/s
Left CCA dist dias: 19 cm/s
Left CCA prox sys: 73 cm/s
Left ICA dist dias: -19 cm/s
Left ICA dist sys: -52 cm/s
Left ICA prox dias: -38 cm/s
Left ICA prox sys: -93 cm/s
RCCADSYS: -73 cm/s
RCCAPDIAS: 26 cm/s
RCCAPSYS: 73 cm/s
RIGHT ECA DIAS: -11 cm/s
RIGHT VERTEBRAL DIAS: -11 cm/s

## 2016-01-25 LAB — SURGICAL PCR SCREEN
MRSA, PCR: NEGATIVE
Staphylococcus aureus: NEGATIVE

## 2016-01-25 LAB — GLUCOSE, CAPILLARY: Glucose-Capillary: 120 mg/dL — ABNORMAL HIGH (ref 65–99)

## 2016-01-25 LAB — POCT I-STAT 4, (NA,K, GLUC, HGB,HCT)
Glucose, Bld: 117 mg/dL — ABNORMAL HIGH (ref 65–99)
HCT: 34 % — ABNORMAL LOW (ref 39.0–52.0)
Hemoglobin: 11.6 g/dL — ABNORMAL LOW (ref 13.0–17.0)
POTASSIUM: 4.4 mmol/L (ref 3.5–5.1)
SODIUM: 137 mmol/L (ref 135–145)

## 2016-01-25 LAB — APTT: APTT: 31 s (ref 24–36)

## 2016-01-25 LAB — BASIC METABOLIC PANEL
Anion gap: 8 (ref 5–15)
BUN: 10 mg/dL (ref 6–20)
CALCIUM: 8.8 mg/dL — AB (ref 8.9–10.3)
CO2: 22 mmol/L (ref 22–32)
CREATININE: 1.27 mg/dL — AB (ref 0.61–1.24)
Chloride: 107 mmol/L (ref 101–111)
GFR calc non Af Amer: 57 mL/min — ABNORMAL LOW (ref >60)
Glucose, Bld: 94 mg/dL (ref 65–99)
Potassium: 3.9 mmol/L (ref 3.5–5.1)
SODIUM: 137 mmol/L (ref 135–145)

## 2016-01-25 LAB — HEMOGLOBIN A1C
HEMOGLOBIN A1C: 5.4 % (ref 4.8–5.6)
MEAN PLASMA GLUCOSE: 108 mg/dL

## 2016-01-25 LAB — HEPARIN LEVEL (UNFRACTIONATED): HEPARIN UNFRACTIONATED: 0.35 [IU]/mL (ref 0.30–0.70)

## 2016-01-25 LAB — TROPONIN I: Troponin I: 0.38 ng/mL (ref <0.03)

## 2016-01-25 LAB — MAGNESIUM: Magnesium: 3.1 mg/dL — ABNORMAL HIGH (ref 1.7–2.4)

## 2016-01-25 LAB — POCT ACTIVATED CLOTTING TIME: ACTIVATED CLOTTING TIME: 158 s

## 2016-01-25 LAB — PLATELET COUNT: Platelets: 241 10*3/uL (ref 150–400)

## 2016-01-25 LAB — HEMOGLOBIN AND HEMATOCRIT, BLOOD
HCT: 29.5 % — ABNORMAL LOW (ref 39.0–52.0)
Hemoglobin: 9.8 g/dL — ABNORMAL LOW (ref 13.0–17.0)

## 2016-01-25 SURGERY — CORONARY ARTERY BYPASS GRAFTING (CABG)
Anesthesia: General | Site: Chest

## 2016-01-25 MED ORDER — PHENYLEPHRINE HCL 10 MG/ML IJ SOLN
INTRAVENOUS | Status: DC | PRN
  Administered 2016-01-25: 20 ug/min via INTRAVENOUS

## 2016-01-25 MED ORDER — MOMETASONE FURO-FORMOTEROL FUM 200-5 MCG/ACT IN AERO
2.0000 | INHALATION_SPRAY | Freq: Two times a day (BID) | RESPIRATORY_TRACT | Status: DC
  Administered 2016-01-25 – 2016-02-04 (×16): 2 via RESPIRATORY_TRACT
  Filled 2016-01-25 (×2): qty 8.8

## 2016-01-25 MED ORDER — PROTAMINE SULFATE 10 MG/ML IV SOLN
INTRAVENOUS | Status: AC
  Filled 2016-01-25: qty 25

## 2016-01-25 MED ORDER — DIPHENHYDRAMINE HCL 50 MG/ML IJ SOLN
INTRAMUSCULAR | Status: DC | PRN
  Administered 2016-01-25: 50 mg via INTRAVENOUS

## 2016-01-25 MED ORDER — MUPIROCIN 2 % EX OINT
TOPICAL_OINTMENT | Freq: Two times a day (BID) | CUTANEOUS | Status: DC
  Administered 2016-01-25: 1 via NASAL
  Filled 2016-01-25: qty 22

## 2016-01-25 MED ORDER — CALCIUM CHLORIDE 10 % IV SOLN
1.0000 g | Freq: Once | INTRAVENOUS | Status: AC
  Administered 2016-01-25: 1 g via INTRAVENOUS

## 2016-01-25 MED ORDER — ATORVASTATIN CALCIUM 80 MG PO TABS
80.0000 mg | ORAL_TABLET | Freq: Every day | ORAL | Status: DC
  Administered 2016-01-26 – 2016-02-03 (×8): 80 mg via ORAL
  Filled 2016-01-25 (×8): qty 1

## 2016-01-25 MED ORDER — MAGNESIUM SULFATE 4 GM/100ML IV SOLN
4.0000 g | Freq: Once | INTRAVENOUS | Status: AC
  Administered 2016-01-25: 4 g via INTRAVENOUS
  Filled 2016-01-25: qty 100

## 2016-01-25 MED ORDER — INSULIN ASPART 100 UNIT/ML ~~LOC~~ SOLN
0.0000 [IU] | SUBCUTANEOUS | Status: DC
  Administered 2016-01-26 (×4): 2 [IU] via SUBCUTANEOUS

## 2016-01-25 MED ORDER — ARTIFICIAL TEARS OP OINT
TOPICAL_OINTMENT | OPHTHALMIC | Status: AC
  Filled 2016-01-25: qty 3.5

## 2016-01-25 MED ORDER — SODIUM CHLORIDE 0.9 % IV SOLN: Freq: Once | INTRAVENOUS | Status: DC

## 2016-01-25 MED ORDER — SODIUM CHLORIDE 0.45 % IV SOLN
INTRAVENOUS | Status: DC | PRN
  Administered 2016-01-25: 20 mL via INTRAVENOUS

## 2016-01-25 MED ORDER — BISACODYL 10 MG RE SUPP: 10.0000 mg | Freq: Every day | RECTAL | Status: DC

## 2016-01-25 MED ORDER — LIDOCAINE 2% (20 MG/ML) 5 ML SYRINGE
INTRAMUSCULAR | Status: DC | PRN
  Administered 2016-01-25: 100 mg via INTRAVENOUS

## 2016-01-25 MED ORDER — HEMOSTATIC AGENTS (NO CHARGE) OPTIME
TOPICAL | Status: DC | PRN
  Administered 2016-01-25: 1 via TOPICAL

## 2016-01-25 MED ORDER — METOPROLOL TARTRATE 5 MG/5ML IV SOLN
2.5000 mg | INTRAVENOUS | Status: DC | PRN
  Administered 2016-01-28 – 2016-01-29 (×2): 5 mg via INTRAVENOUS
  Filled 2016-01-25 (×2): qty 5

## 2016-01-25 MED ORDER — LACTATED RINGERS IV SOLN: 500.0000 mL | Freq: Once | INTRAVENOUS | Status: DC | PRN

## 2016-01-25 MED ORDER — DOPAMINE-DEXTROSE 3.2-5 MG/ML-% IV SOLN
2.0000 ug/kg/min | INTRAVENOUS | Status: DC
  Administered 2016-01-26: 2 ug/kg/min via INTRAVENOUS
  Filled 2016-01-25: qty 250

## 2016-01-25 MED ORDER — SODIUM CHLORIDE 0.9 % IV SOLN: 250.0000 mL | INTRAVENOUS | Status: DC

## 2016-01-25 MED ORDER — METOCLOPRAMIDE HCL 5 MG/ML IJ SOLN
10.0000 mg | Freq: Four times a day (QID) | INTRAMUSCULAR | Status: AC
  Administered 2016-01-25 – 2016-01-30 (×20): 10 mg via INTRAVENOUS
  Filled 2016-01-25 (×20): qty 2

## 2016-01-25 MED ORDER — PANTOPRAZOLE SODIUM 40 MG PO TBEC
40.0000 mg | DELAYED_RELEASE_TABLET | Freq: Every day | ORAL | Status: DC
  Administered 2016-01-27 – 2016-01-28 (×2): 40 mg via ORAL
  Filled 2016-01-25 (×3): qty 1

## 2016-01-25 MED ORDER — AMIODARONE HCL IN DEXTROSE 360-4.14 MG/200ML-% IV SOLN
30.0000 mg/h | INTRAVENOUS | Status: DC
Start: 2016-01-25 — End: 2016-01-26
  Administered 2016-01-25: 30 mg/h via INTRAVENOUS
  Filled 2016-01-25: qty 200

## 2016-01-25 MED ORDER — HEPARIN SODIUM (PORCINE) 1000 UNIT/ML IJ SOLN
INTRAMUSCULAR | Status: DC | PRN
  Administered 2016-01-25: 3000 [IU] via INTRAVENOUS
  Administered 2016-01-25: 32000 [IU] via INTRAVENOUS

## 2016-01-25 MED ORDER — LIDOCAINE 2% (20 MG/ML) 5 ML SYRINGE
INTRAMUSCULAR | Status: AC
  Filled 2016-01-25: qty 5

## 2016-01-25 MED ORDER — MORPHINE SULFATE (PF) 2 MG/ML IV SOLN
2.0000 mg | INTRAVENOUS | Status: DC | PRN
  Administered 2016-01-25 – 2016-01-26 (×2): 2 mg via INTRAVENOUS
  Filled 2016-01-25 (×3): qty 1

## 2016-01-25 MED ORDER — METOPROLOL TARTRATE 12.5 MG HALF TABLET
12.5000 mg | ORAL_TABLET | Freq: Two times a day (BID) | ORAL | Status: DC
  Administered 2016-01-27 – 2016-02-04 (×11): 12.5 mg via ORAL
  Filled 2016-01-25 (×13): qty 1

## 2016-01-25 MED ORDER — DOCUSATE SODIUM 100 MG PO CAPS
200.0000 mg | ORAL_CAPSULE | Freq: Every day | ORAL | Status: DC
  Administered 2016-01-26 – 2016-02-02 (×5): 200 mg via ORAL
  Filled 2016-01-25 (×8): qty 2

## 2016-01-25 MED ORDER — THROMBIN 5000 UNITS EX SOLR
CUTANEOUS | Status: AC
  Filled 2016-01-25: qty 5000

## 2016-01-25 MED ORDER — LACTATED RINGERS IV SOLN
INTRAVENOUS | Status: DC | PRN
  Administered 2016-01-25 (×2): via INTRAVENOUS

## 2016-01-25 MED ORDER — LEVALBUTEROL HCL 1.25 MG/0.5ML IN NEBU
1.2500 mg | INHALATION_SOLUTION | Freq: Four times a day (QID) | RESPIRATORY_TRACT | Status: DC
  Administered 2016-01-25 – 2016-01-26 (×3): 1.25 mg via RESPIRATORY_TRACT
  Filled 2016-01-25 (×3): qty 0.5

## 2016-01-25 MED ORDER — ROCURONIUM BROMIDE 10 MG/ML (PF) SYRINGE
PREFILLED_SYRINGE | INTRAVENOUS | Status: AC
  Filled 2016-01-25: qty 10

## 2016-01-25 MED ORDER — TRAMADOL HCL 50 MG PO TABS
50.0000 mg | ORAL_TABLET | ORAL | Status: DC | PRN
  Administered 2016-01-26: 50 mg via ORAL
  Administered 2016-01-27: 100 mg via ORAL
  Administered 2016-01-31 – 2016-02-02 (×3): 50 mg via ORAL
  Filled 2016-01-25: qty 2
  Filled 2016-01-25 (×5): qty 1

## 2016-01-25 MED ORDER — HEPARIN SODIUM (PORCINE) 1000 UNIT/ML IJ SOLN
INTRAMUSCULAR | Status: AC
  Filled 2016-01-25: qty 1

## 2016-01-25 MED ORDER — SODIUM CHLORIDE 0.9% FLUSH: 3.0000 mL | INTRAVENOUS | Status: DC | PRN

## 2016-01-25 MED ORDER — INSULIN REGULAR BOLUS VIA INFUSION
0.0000 [IU] | Freq: Three times a day (TID) | INTRAVENOUS | Status: DC
  Filled 2016-01-25: qty 10

## 2016-01-25 MED ORDER — ACETAMINOPHEN 160 MG/5ML PO SOLN: 650.0000 mg | Freq: Once | ORAL | Status: AC

## 2016-01-25 MED ORDER — SUCCINYLCHOLINE CHLORIDE 20 MG/ML IJ SOLN
INTRAMUSCULAR | Status: DC | PRN
  Administered 2016-01-25: 100 mg via INTRAVENOUS

## 2016-01-25 MED ORDER — LACTATED RINGERS IV SOLN
INTRAVENOUS | Status: DC | PRN
  Administered 2016-01-25: 07:00:00 via INTRAVENOUS

## 2016-01-25 MED ORDER — NITROGLYCERIN IN D5W 200-5 MCG/ML-% IV SOLN: 0.0000 ug/min | INTRAVENOUS | Status: DC

## 2016-01-25 MED ORDER — LACTATED RINGERS IV SOLN
INTRAVENOUS | Status: DC | PRN
  Administered 2016-01-25 (×2): via INTRAVENOUS

## 2016-01-25 MED ORDER — ASPIRIN 81 MG PO CHEW: 324.0000 mg | CHEWABLE_TABLET | Freq: Every day | ORAL | Status: DC

## 2016-01-25 MED ORDER — FENTANYL CITRATE (PF) 250 MCG/5ML IJ SOLN
INTRAMUSCULAR | Status: AC
  Filled 2016-01-25: qty 20

## 2016-01-25 MED ORDER — FAMOTIDINE IN NACL 20-0.9 MG/50ML-% IV SOLN
20.0000 mg | Freq: Two times a day (BID) | INTRAVENOUS | Status: AC
  Administered 2016-01-25: 20 mg via INTRAVENOUS

## 2016-01-25 MED ORDER — ASPIRIN EC 325 MG PO TBEC
325.0000 mg | DELAYED_RELEASE_TABLET | Freq: Every day | ORAL | Status: DC
  Administered 2016-01-26 – 2016-02-04 (×8): 325 mg via ORAL
  Filled 2016-01-25 (×9): qty 1

## 2016-01-25 MED ORDER — PHENYLEPHRINE HCL 10 MG/ML IJ SOLN
0.0000 ug/min | INTRAMUSCULAR | Status: DC
  Administered 2016-01-28: 10 ug/min via INTRAVENOUS
  Filled 2016-01-25 (×3): qty 2

## 2016-01-25 MED ORDER — VANCOMYCIN HCL IN DEXTROSE 1-5 GM/200ML-% IV SOLN
1000.0000 mg | Freq: Once | INTRAVENOUS | Status: AC
  Administered 2016-01-25: 1000 mg via INTRAVENOUS
  Filled 2016-01-25: qty 200

## 2016-01-25 MED ORDER — CHLORHEXIDINE GLUCONATE 0.12% ORAL RINSE (MEDLINE KIT)
15.0000 mL | Freq: Two times a day (BID) | OROMUCOSAL | Status: DC
  Administered 2016-01-26 – 2016-01-28 (×4): 15 mL via OROMUCOSAL

## 2016-01-25 MED ORDER — AMIODARONE HCL IN DEXTROSE 360-4.14 MG/200ML-% IV SOLN
30.0000 mg/h | INTRAVENOUS | Status: AC
  Administered 2016-01-25: 30 mg/h via INTRAVENOUS

## 2016-01-25 MED ORDER — FENTANYL CITRATE (PF) 250 MCG/5ML IJ SOLN
INTRAMUSCULAR | Status: AC
  Filled 2016-01-25: qty 5

## 2016-01-25 MED ORDER — OXYCODONE HCL 5 MG PO TABS
5.0000 mg | ORAL_TABLET | ORAL | Status: DC | PRN
  Administered 2016-01-26 – 2016-01-27 (×6): 10 mg via ORAL
  Filled 2016-01-25 (×6): qty 2

## 2016-01-25 MED ORDER — PROPOFOL 10 MG/ML IV BOLUS
INTRAVENOUS | Status: DC | PRN
  Administered 2016-01-25: 130 mg via INTRAVENOUS

## 2016-01-25 MED ORDER — ACETAMINOPHEN 650 MG RE SUPP
650.0000 mg | Freq: Once | RECTAL | Status: AC
  Administered 2016-01-25: 650 mg via RECTAL

## 2016-01-25 MED ORDER — LACTATED RINGERS IV SOLN: INTRAVENOUS | Status: DC

## 2016-01-25 MED ORDER — PROTAMINE SULFATE 10 MG/ML IV SOLN
INTRAVENOUS | Status: DC | PRN
  Administered 2016-01-25 (×2): 30 mg via INTRAVENOUS
  Administered 2016-01-25 (×4): 50 mg via INTRAVENOUS
  Administered 2016-01-25: 40 mg via INTRAVENOUS
  Administered 2016-01-25: 20 mg via INTRAVENOUS

## 2016-01-25 MED ORDER — AMIODARONE HCL IN DEXTROSE 360-4.14 MG/200ML-% IV SOLN
60.0000 mg/h | INTRAVENOUS | Status: AC
  Administered 2016-01-25: 30 mg/h via INTRAVENOUS
  Administered 2016-01-25: 15:00:00 via INTRAVENOUS
  Filled 2016-01-25: qty 200

## 2016-01-25 MED ORDER — SODIUM CHLORIDE 0.9% FLUSH
3.0000 mL | Freq: Two times a day (BID) | INTRAVENOUS | Status: DC
  Administered 2016-01-26 – 2016-02-01 (×9): 3 mL via INTRAVENOUS

## 2016-01-25 MED ORDER — DOPAMINE-DEXTROSE 1.6-5 MG/ML-% IV SOLN
INTRAVENOUS | Status: DC | PRN
Start: 2016-01-25 — End: 2016-01-25
  Administered 2016-01-25: 2 ug/kg/min via INTRAVENOUS

## 2016-01-25 MED ORDER — SODIUM CHLORIDE 0.9 % IV SOLN
20.0000 ug | INTRAVENOUS | Status: AC
  Administered 2016-01-25: 20 ug via INTRAVENOUS
  Filled 2016-01-25: qty 5

## 2016-01-25 MED ORDER — POTASSIUM CHLORIDE 10 MEQ/50ML IV SOLN: 10.0000 meq | INTRAVENOUS | Status: AC

## 2016-01-25 MED ORDER — DEXMEDETOMIDINE HCL IN NACL 200 MCG/50ML IV SOLN
0.0000 ug/kg/h | INTRAVENOUS | Status: DC
  Filled 2016-01-25: qty 50

## 2016-01-25 MED ORDER — 0.9 % SODIUM CHLORIDE (POUR BTL) OPTIME
TOPICAL | Status: DC | PRN
  Administered 2016-01-25: 1000 mL

## 2016-01-25 MED ORDER — ARTIFICIAL TEARS OP OINT
TOPICAL_OINTMENT | OPHTHALMIC | Status: DC | PRN
  Administered 2016-01-25: 1 via OPHTHALMIC

## 2016-01-25 MED ORDER — FENTANYL CITRATE (PF) 250 MCG/5ML IJ SOLN
INTRAMUSCULAR | Status: DC | PRN
  Administered 2016-01-25: 150 ug via INTRAVENOUS
  Administered 2016-01-25: 300 ug via INTRAVENOUS
  Administered 2016-01-25 (×2): 100 ug via INTRAVENOUS
  Administered 2016-01-25: 50 ug via INTRAVENOUS
  Administered 2016-01-25 (×2): 150 ug via INTRAVENOUS
  Administered 2016-01-25: 100 ug via INTRAVENOUS
  Administered 2016-01-25: 50 ug via INTRAVENOUS
  Administered 2016-01-25: 100 ug via INTRAVENOUS

## 2016-01-25 MED ORDER — ACETAMINOPHEN 160 MG/5ML PO SOLN: 1000.0000 mg | Freq: Four times a day (QID) | ORAL | Status: AC

## 2016-01-25 MED ORDER — PLASMA-LYTE 148 IV SOLN
INTRAVENOUS | Status: DC | PRN
  Administered 2016-01-25: 10:00:00 via INTRAVASCULAR

## 2016-01-25 MED ORDER — MIDAZOLAM HCL 5 MG/5ML IJ SOLN
INTRAMUSCULAR | Status: DC | PRN
  Administered 2016-01-25: 1 mg via INTRAVENOUS
  Administered 2016-01-25: 3 mg via INTRAVENOUS

## 2016-01-25 MED ORDER — CHLORHEXIDINE GLUCONATE 0.12 % MT SOLN: 15.0000 mL | OROMUCOSAL | Status: AC

## 2016-01-25 MED ORDER — ALBUMIN HUMAN 5 % IV SOLN
250.0000 mL | INTRAVENOUS | Status: AC | PRN
  Administered 2016-01-25 (×2): 250 mL via INTRAVENOUS

## 2016-01-25 MED ORDER — ROCURONIUM BROMIDE 100 MG/10ML IV SOLN: INTRAVENOUS | Status: DC | PRN

## 2016-01-25 MED ORDER — DEXTROSE 5 % IV SOLN
1.5000 g | Freq: Two times a day (BID) | INTRAVENOUS | Status: DC
  Administered 2016-01-25 – 2016-01-27 (×3): 1.5 g via INTRAVENOUS
  Filled 2016-01-25 (×4): qty 1.5

## 2016-01-25 MED ORDER — SODIUM CHLORIDE 0.9 % IV SOLN
INTRAVENOUS | Status: DC
  Filled 2016-01-25: qty 2.5

## 2016-01-25 MED ORDER — PROPOFOL 10 MG/ML IV BOLUS
INTRAVENOUS | Status: AC
  Filled 2016-01-25: qty 20

## 2016-01-25 MED ORDER — SODIUM CHLORIDE 0.9 % IV SOLN: INTRAVENOUS | Status: DC

## 2016-01-25 MED ORDER — ACETAMINOPHEN 500 MG PO TABS
1000.0000 mg | ORAL_TABLET | Freq: Four times a day (QID) | ORAL | Status: AC
  Administered 2016-01-25 – 2016-01-30 (×12): 1000 mg via ORAL
  Filled 2016-01-25 (×13): qty 2

## 2016-01-25 MED ORDER — DIPHENHYDRAMINE HCL 50 MG/ML IJ SOLN
INTRAMUSCULAR | Status: AC
  Filled 2016-01-25: qty 1

## 2016-01-25 MED ORDER — MORPHINE SULFATE (PF) 2 MG/ML IV SOLN: 1.0000 mg | INTRAVENOUS | Status: AC | PRN

## 2016-01-25 MED ORDER — BISACODYL 5 MG PO TBEC
10.0000 mg | DELAYED_RELEASE_TABLET | Freq: Every day | ORAL | Status: DC
  Administered 2016-01-26 – 2016-01-31 (×4): 10 mg via ORAL
  Filled 2016-01-25 (×8): qty 2

## 2016-01-25 MED ORDER — ORAL CARE MOUTH RINSE
15.0000 mL | Freq: Four times a day (QID) | OROMUCOSAL | Status: DC
  Administered 2016-01-25 – 2016-01-28 (×7): 15 mL via OROMUCOSAL

## 2016-01-25 MED ORDER — SODIUM CHLORIDE 0.9 % IV SOLN
INTRAVENOUS | Status: DC | PRN
Start: 2016-01-25 — End: 2016-01-25
  Administered 2016-01-25: 14:00:00 via INTRAVENOUS

## 2016-01-25 MED ORDER — ROCURONIUM BROMIDE 10 MG/ML (PF) SYRINGE
PREFILLED_SYRINGE | INTRAVENOUS | Status: DC | PRN
  Administered 2016-01-25 (×2): 50 mg via INTRAVENOUS
  Administered 2016-01-25: 100 mg via INTRAVENOUS

## 2016-01-25 MED ORDER — MIDAZOLAM HCL 2 MG/2ML IJ SOLN: 2.0000 mg | INTRAMUSCULAR | Status: DC | PRN

## 2016-01-25 MED ORDER — METOPROLOL TARTRATE 25 MG/10 ML ORAL SUSPENSION: 12.5000 mg | Freq: Two times a day (BID) | ORAL | Status: DC

## 2016-01-25 MED ORDER — MIDAZOLAM HCL 10 MG/2ML IJ SOLN
INTRAMUSCULAR | Status: AC
  Filled 2016-01-25: qty 2

## 2016-01-25 MED ORDER — LACTATED RINGERS IV SOLN
INTRAVENOUS | Status: DC
  Administered 2016-01-28: 23:00:00 via INTRAVENOUS

## 2016-01-25 MED ORDER — ONDANSETRON HCL 4 MG/2ML IJ SOLN: 4.0000 mg | Freq: Four times a day (QID) | INTRAMUSCULAR | Status: DC | PRN

## 2016-01-25 MED FILL — Nitroglycerin IV Soln 100 MCG/ML in D5W: INTRA_ARTERIAL | Qty: 10 | Status: AC

## 2016-01-25 MED FILL — Lidocaine HCl IV Inj 20 MG/ML: INTRAVENOUS | Qty: 5 | Status: AC

## 2016-01-25 MED FILL — Sodium Chloride IV Soln 0.9%: INTRAVENOUS | Qty: 2000 | Status: AC

## 2016-01-25 MED FILL — Magnesium Sulfate Inj 50%: INTRAMUSCULAR | Qty: 10 | Status: AC

## 2016-01-25 MED FILL — Heparin Sodium (Porcine) Inj 1000 Unit/ML: INTRAMUSCULAR | Qty: 30 | Status: AC

## 2016-01-25 MED FILL — Sodium Bicarbonate IV Soln 8.4%: INTRAVENOUS | Qty: 50 | Status: AC

## 2016-01-25 MED FILL — Mannitol IV Soln 20%: INTRAVENOUS | Qty: 500 | Status: AC

## 2016-01-25 MED FILL — Electrolyte-R (PH 7.4) Solution: INTRAVENOUS | Qty: 5000 | Status: AC

## 2016-01-25 MED FILL — Potassium Chloride Inj 2 mEq/ML: INTRAVENOUS | Qty: 40 | Status: AC

## 2016-01-25 MED FILL — Heparin Sodium (Porcine) Inj 1000 Unit/ML: INTRAMUSCULAR | Qty: 10 | Status: AC

## 2016-01-25 MED FILL — Tirofiban HCl in NaCl 0.9% IV Soln 5 MG/100ML (Base Equiv): INTRAVENOUS | Qty: 100 | Status: AC

## 2016-01-25 SURGICAL SUPPLY — 102 items
ADAPTER CARDIO PERF ANTE/RETRO (ADAPTER) ×4 IMPLANT
BAG DECANTER FOR FLEXI CONT (MISCELLANEOUS) ×4 IMPLANT
BANDAGE ACE 4X5 VEL STRL LF (GAUZE/BANDAGES/DRESSINGS) ×4 IMPLANT
BANDAGE ACE 6X5 VEL STRL LF (GAUZE/BANDAGES/DRESSINGS) ×4 IMPLANT
BANDAGE GAUZE 4  KLING STR (GAUZE/BANDAGES/DRESSINGS) ×4 IMPLANT
BASKET HEART  (ORDER IN 25'S) (MISCELLANEOUS) ×1
BASKET HEART (ORDER IN 25'S) (MISCELLANEOUS) ×1
BASKET HEART (ORDER IN 25S) (MISCELLANEOUS) ×2 IMPLANT
BLADE STERNUM SYSTEM 6 (BLADE) ×4 IMPLANT
BLADE SURG 11 STRL SS (BLADE) ×4 IMPLANT
BLADE SURG 12 STRL SS (BLADE) ×4 IMPLANT
BLADE SURG ROTATE 9660 (MISCELLANEOUS) IMPLANT
BNDG GAUZE ELAST 4 BULKY (GAUZE/BANDAGES/DRESSINGS) ×4 IMPLANT
CANISTER SUCTION 2500CC (MISCELLANEOUS) ×4 IMPLANT
CANNULA GUNDRY RCSP 15FR (MISCELLANEOUS) ×4 IMPLANT
CATH CPB KIT VANTRIGT (MISCELLANEOUS) ×4 IMPLANT
CATH ROBINSON RED A/P 18FR (CATHETERS) ×12 IMPLANT
CATH THORACIC 36FR RT ANG (CATHETERS) ×4 IMPLANT
CLIP FOGARTY SPRING 6M (CLIP) ×4 IMPLANT
CLIP TI WIDE RED SMALL 24 (CLIP) ×4 IMPLANT
CRADLE DONUT ADULT HEAD (MISCELLANEOUS) ×4 IMPLANT
DRAIN CHANNEL 32F RND 10.7 FF (WOUND CARE) ×4 IMPLANT
DRAPE CARDIOVASCULAR INCISE (DRAPES) ×2
DRAPE SLUSH/WARMER DISC (DRAPES) ×4 IMPLANT
DRAPE SRG 135X102X78XABS (DRAPES) ×2 IMPLANT
DRSG AQUACEL AG ADV 3.5X14 (GAUZE/BANDAGES/DRESSINGS) ×4 IMPLANT
ELECT BLADE 4.0 EZ CLEAN MEGAD (MISCELLANEOUS) ×8
ELECT BLADE 6.5 EXT (BLADE) ×4 IMPLANT
ELECT CAUTERY BLADE 6.4 (BLADE) ×4 IMPLANT
ELECT REM PT RETURN 9FT ADLT (ELECTROSURGICAL) ×8
ELECTRODE BLDE 4.0 EZ CLN MEGD (MISCELLANEOUS) ×4 IMPLANT
ELECTRODE REM PT RTRN 9FT ADLT (ELECTROSURGICAL) ×4 IMPLANT
FELT TEFLON 1X6 (MISCELLANEOUS) ×8 IMPLANT
GAUZE SPONGE 4X4 12PLY STRL (GAUZE/BANDAGES/DRESSINGS) ×8 IMPLANT
GLOVE BIO SURGEON STRL SZ 6 (GLOVE) ×20 IMPLANT
GLOVE BIO SURGEON STRL SZ 6.5 (GLOVE) ×6 IMPLANT
GLOVE BIO SURGEON STRL SZ7.5 (GLOVE) ×16 IMPLANT
GLOVE BIO SURGEONS STRL SZ 6.5 (GLOVE) ×2
GLOVE BIOGEL PI IND STRL 6 (GLOVE) ×4 IMPLANT
GLOVE BIOGEL PI IND STRL 6.5 (GLOVE) ×4 IMPLANT
GLOVE BIOGEL PI INDICATOR 6 (GLOVE) ×4
GLOVE BIOGEL PI INDICATOR 6.5 (GLOVE) ×4
GLOVE SURG SS PI 6.0 STRL IVOR (GLOVE) ×8 IMPLANT
GOWN STRL REUS W/ TWL LRG LVL3 (GOWN DISPOSABLE) ×8 IMPLANT
GOWN STRL REUS W/TWL LRG LVL3 (GOWN DISPOSABLE) ×8
HEMOSTAT POWDER SURGIFOAM 1G (HEMOSTASIS) ×12 IMPLANT
HEMOSTAT SURGICEL 2X14 (HEMOSTASIS) ×4 IMPLANT
INSERT FOGARTY XLG (MISCELLANEOUS) IMPLANT
KIT BASIN OR (CUSTOM PROCEDURE TRAY) ×4 IMPLANT
KIT ROOM TURNOVER OR (KITS) ×4 IMPLANT
KIT SUCTION CATH 14FR (SUCTIONS) ×4 IMPLANT
KIT VASOVIEW HEMOPRO VH 3000 (KITS) ×4 IMPLANT
LEAD PACING MYOCARDI (MISCELLANEOUS) ×4 IMPLANT
MARKER GRAFT CORONARY BYPASS (MISCELLANEOUS) ×12 IMPLANT
NS IRRIG 1000ML POUR BTL (IV SOLUTION) ×20 IMPLANT
PACK OPEN HEART (CUSTOM PROCEDURE TRAY) ×4 IMPLANT
PAD ARMBOARD 7.5X6 YLW CONV (MISCELLANEOUS) ×8 IMPLANT
PAD ELECT DEFIB RADIOL ZOLL (MISCELLANEOUS) ×4 IMPLANT
PENCIL BUTTON HOLSTER BLD 10FT (ELECTRODE) ×4 IMPLANT
PUNCH AORTIC ROTATE 4.0MM (MISCELLANEOUS) ×4 IMPLANT
PUNCH AORTIC ROTATE 4.5MM 8IN (MISCELLANEOUS) IMPLANT
PUNCH AORTIC ROTATE 5MM 8IN (MISCELLANEOUS) IMPLANT
SET CARDIOPLEGIA MPS 5001102 (MISCELLANEOUS) ×4 IMPLANT
SOLUTION ANTI FOG 6CC (MISCELLANEOUS) ×4 IMPLANT
SPONGE GAUZE 4X4 12PLY STER LF (GAUZE/BANDAGES/DRESSINGS) ×8 IMPLANT
SPONGE LAP 18X18 X RAY DECT (DISPOSABLE) ×8 IMPLANT
SPONGE LAP 4X18 X RAY DECT (DISPOSABLE) ×4 IMPLANT
SURGIFLO W/THROMBIN 8M KIT (HEMOSTASIS) ×4 IMPLANT
SUT BONE WAX W31G (SUTURE) ×4 IMPLANT
SUT MNCRL AB 3-0 PS2 18 (SUTURE) ×4 IMPLANT
SUT MNCRL AB 4-0 PS2 18 (SUTURE) IMPLANT
SUT PROLENE 3 0 SH DA (SUTURE) IMPLANT
SUT PROLENE 3 0 SH1 36 (SUTURE) IMPLANT
SUT PROLENE 4 0 RB 1 (SUTURE) ×2
SUT PROLENE 4 0 SH DA (SUTURE) ×4 IMPLANT
SUT PROLENE 4-0 RB1 .5 CRCL 36 (SUTURE) ×2 IMPLANT
SUT PROLENE 5 0 C 1 36 (SUTURE) ×8 IMPLANT
SUT PROLENE 6 0 C 1 30 (SUTURE) ×24 IMPLANT
SUT PROLENE 6 0 CC (SUTURE) ×20 IMPLANT
SUT PROLENE 8 0 BV175 6 (SUTURE) IMPLANT
SUT PROLENE BLUE 7 0 (SUTURE) ×8 IMPLANT
SUT SILK  1 MH (SUTURE)
SUT SILK 1 MH (SUTURE) IMPLANT
SUT SILK 2 0 SH CR/8 (SUTURE) IMPLANT
SUT SILK 3 0 SH CR/8 (SUTURE) IMPLANT
SUT STEEL 6MS V (SUTURE) ×8 IMPLANT
SUT STEEL SZ 6 DBL 3X14 BALL (SUTURE) ×4 IMPLANT
SUT VIC AB 1 CTX 36 (SUTURE) ×4
SUT VIC AB 1 CTX36XBRD ANBCTR (SUTURE) ×4 IMPLANT
SUT VIC AB 2-0 CT1 27 (SUTURE) ×2
SUT VIC AB 2-0 CT1 TAPERPNT 27 (SUTURE) ×2 IMPLANT
SUT VIC AB 2-0 CTX 27 (SUTURE) IMPLANT
SUT VIC AB 3-0 X1 27 (SUTURE) IMPLANT
SUTURE E-PAK OPEN HEART (SUTURE) ×4 IMPLANT
SYSTEM SAHARA CHEST DRAIN ATS (WOUND CARE) ×4 IMPLANT
TAPE CLOTH SURG 4X10 WHT LF (GAUZE/BANDAGES/DRESSINGS) ×8 IMPLANT
TOWEL OR 17X24 6PK STRL BLUE (TOWEL DISPOSABLE) ×8 IMPLANT
TOWEL OR 17X26 10 PK STRL BLUE (TOWEL DISPOSABLE) ×8 IMPLANT
TRAY FOLEY IC TEMP SENS 16FR (CATHETERS) ×4 IMPLANT
TUBING INSUFFLATION (TUBING) ×4 IMPLANT
UNDERPAD 30X30 (UNDERPADS AND DIAPERS) ×4 IMPLANT
WATER STERILE IRR 1000ML POUR (IV SOLUTION) ×8 IMPLANT

## 2016-01-25 NOTE — Anesthesia Procedure Notes (Signed)
Central Venous Catheter Insertion Performed by: anesthesiologist Patient location: Pre-op. Preanesthetic checklist: patient identified, IV checked, site marked, risks and benefits discussed, surgical consent, monitors and equipment checked, pre-op evaluation, timeout performed and anesthesia consent Position: Trendelenburg Lidocaine 1% used for infiltration Landmarks identified and Seldinger technique used Catheter size: 8.5 Fr Central line was placed.Sheath introducer Swan type and PA catheter depth:thermodilation and 50PA Cath depth:50 Procedure performed using ultrasound guided technique. Attempts: 1 Following insertion, line sutured and dressing applied. Post procedure assessment: blood return through all ports, free fluid flow and no air. Patient tolerated the procedure well with no immediate complications.

## 2016-01-25 NOTE — Anesthesia Procedure Notes (Addendum)
Procedure Name: Intubation Date/Time: 01/25/2016 7:44 AM Performed by: Rise Patience T Pre-anesthesia Checklist: Patient identified, Emergency Drugs available, Suction available and Patient being monitored Patient Re-evaluated:Patient Re-evaluated prior to inductionOxygen Delivery Method: Circle System Utilized Preoxygenation: Pre-oxygenation with 100% oxygen Intubation Type: IV induction Ventilation: Mask ventilation without difficulty and Oral airway inserted - appropriate to patient size Laryngoscope Size: Mac and 3 Grade View: Grade I Tube type: Oral Tube size: 8.0 mm Number of attempts: 1 Airway Equipment and Method: Stylet and Oral airway Placement Confirmation: ETT inserted through vocal cords under direct vision,  positive ETCO2 and breath sounds checked- equal and bilateral Secured at: 23 cm Tube secured with: Tape Dental Injury: Teeth and Oropharynx as per pre-operative assessment  Comments: Intubation by Garrel Ridgel

## 2016-01-25 NOTE — Progress Notes (Signed)
Initiated Open Heart Rapid Wean per Protocol. RT will continue to monitor.  

## 2016-01-25 NOTE — Progress Notes (Signed)
      [address]       [place] [address]             [phone]      PM Rounds  Starting to wake up, but requiring restraints for safety  Intubated  BP 106/71   Pulse 89   Temp (!) 96.4 F (35.8 C)   Resp 16   Ht 6\' 1"  (1.854 m)   Wt 173 lb 8 oz (78.7 kg)   SpO2 100%   BMI 22.89 kg/m   28/14 CI= 1.7   Intake/Output Summary (Last 24 hours) at 01/25/16 1711 Last data filed at 01/25/16 1600  Gross per 24 hour  Intake          4384.81 ml  Output             5640 ml  Net         -1255.19 ml   Hct= 37, K= 4.7  Overall doing well  Wean to extubate  He needs volume- will give albumin  Viviann Spare C. Dorris Fetch, MD [facility] [phone]

## 2016-01-25 NOTE — Progress Notes (Signed)
  Echocardiogram 2D Echocardiogram has been performed.  Arvil Chaco 01/25/2016, 8:33 AM

## 2016-01-25 NOTE — Progress Notes (Signed)
   In operating room for CABG. We will continue to follow post operatively.

## 2016-01-25 NOTE — Brief Op Note (Signed)
01/23/2016 - 01/25/2016  12:03 PM  PATIENT:  Darryl George  65 y.o. male  PRE-OPERATIVE DIAGNOSIS:  coronary artery disease  POST-OPERATIVE DIAGNOSIS:  coronary artery disease  PROCEDURE:  Procedure(s): CORONARY ARTERY BYPASS GRAFTING (CABG) times four using left internal mammary artery and right saphenous vein. (N/A)  SURGEON:  Surgeon(s) and Role:    * Kerin Perna, MD - Primary  PHYSICIAN ASSISTANT:   Jari Favre, PA-C   ANESTHESIA:   general  EBL:  Total I/O In: -  Out: 575 [Urine:575]  BLOOD ADMINISTERED:none  DRAINS: routine   LOCAL MEDICATIONS USED:  none  SPECIMEN:  No Specimen  DISPOSITION OF SPECIMEN:  N/A  COUNTS:  YES  TOURNIQUET:  * No tourniquets in log *  DICTATION: .Other Dictation: Dictation Number pending  PLAN OF CARE: Admit to inpatient   PATIENT DISPOSITION:  ICU - intubated and hemodynamically stable.

## 2016-01-25 NOTE — OR Nursing (Signed)
1st call to SICU 1307. 2nd call SICU 1351.

## 2016-01-25 NOTE — Procedures (Signed)
Extubation Procedure Note  Patient Details:   Name: Darryl George DOB: Dec 11, 1950 MRN: [id number]   Airway Documentation:     Evaluation  O2 sats: stable throughout Complications: No apparent complications Patient did tolerate procedure well. Bilateral Breath Sounds: Diminished   Yes   Pt extubated to 3L [place] per protocol. Pt with a very weak non productive cough but is able to speak his name although hoarse. Pt NIF -30, VC 0.70, ABG within normal limits. Pt with diminished BS throughout. RT will continue to monitor.   Carolan Shiver 01/25/2016, 6:40 PM

## 2016-01-25 NOTE — Progress Notes (Signed)
Belongings given to family.

## 2016-01-25 NOTE — Progress Notes (Signed)
The patient was examined and preop studies reviewed. There has been no change from the prior exam and the patient is ready for surgery.  plan CABG on Darryl George

## 2016-01-25 NOTE — Transfer of Care (Signed)
Immediate Anesthesia Transfer of Care Note  Patient: Darryl George  Procedure(s) Performed: Procedure(s): CORONARY ARTERY BYPASS GRAFTING (CABG) times five using left internal mammary artery and right saphenous vein. (N/A) TRANSESOPHAGEAL ECHOCARDIOGRAM (TEE)  Patient Location: SICU  Anesthesia Type:General  Level of Consciousness: sedated, unresponsive, comatose and Patient remains intubated per anesthesia plan  Airway & Oxygen Therapy: Patient remains intubated per anesthesia plan and Patient placed on Ventilator (see vital sign flow sheet for setting)  Post-op Assessment: Report given to RN and Post -op Vital signs reviewed and stable  Post vital signs: Reviewed and stable  Last Vitals:  Vitals:   01/25/16 0527 01/25/16 0600  BP:    Pulse: 63 63  Resp:  16  Temp:      Last Pain:  Vitals:   01/25/16 0400  TempSrc: Oral  PainSc:          Complications: No apparent anesthesia complications

## 2016-01-26 ENCOUNTER — Encounter (HOSPITAL_COMMUNITY): Payer: Self-pay | Admitting: Cardiothoracic Surgery

## 2016-01-26 ENCOUNTER — Inpatient Hospital Stay (HOSPITAL_COMMUNITY): Payer: Medicare Other

## 2016-01-26 LAB — POCT I-STAT 3, ART BLOOD GAS (G3+)
Bicarbonate: 23.2 mmol/L (ref 20.0–28.0)
O2 Saturation: 91 %
PCO2 ART: 32.7 mmHg (ref 32.0–48.0)
PH ART: 7.458 — AB (ref 7.350–7.450)
Patient temperature: 37
TCO2: 24 mmol/L (ref 0–100)
pO2, Arterial: 57 mmHg — ABNORMAL LOW (ref 83.0–108.0)

## 2016-01-26 LAB — CBC
HCT: 29.7 % — ABNORMAL LOW (ref 39.0–52.0)
HEMATOCRIT: 30.8 % — AB (ref 39.0–52.0)
HEMOGLOBIN: 10.2 g/dL — AB (ref 13.0–17.0)
Hemoglobin: 9.9 g/dL — ABNORMAL LOW (ref 13.0–17.0)
MCH: 28.6 pg (ref 26.0–34.0)
MCH: 28.7 pg (ref 26.0–34.0)
MCHC: 33.1 g/dL (ref 30.0–36.0)
MCHC: 33.3 g/dL (ref 30.0–36.0)
MCV: 86.3 fL (ref 78.0–100.0)
MCV: 86.1 fL (ref 78.0–100.0)
PLATELETS: 229 10*3/uL (ref 150–400)
Platelets: 220 10*3/uL (ref 150–400)
RBC: 3.57 MIL/uL — ABNORMAL LOW (ref 4.22–5.81)
RBC: 3.45 MIL/uL — ABNORMAL LOW (ref 4.22–5.81)
RDW: 13.8 % (ref 11.5–15.5)
RDW: 14.2 % (ref 11.5–15.5)
WBC: 11.3 10*3/uL — ABNORMAL HIGH (ref 4.0–10.5)
WBC: 16.7 10*3/uL — AB (ref 4.0–10.5)

## 2016-01-26 LAB — GLUCOSE, CAPILLARY
GLUCOSE-CAPILLARY: 133 mg/dL — AB (ref 65–99)
GLUCOSE-CAPILLARY: 138 mg/dL — AB (ref 65–99)
GLUCOSE-CAPILLARY: 123 mg/dL — AB (ref 65–99)
GLUCOSE-CAPILLARY: 144 mg/dL — AB (ref 65–99)
GLUCOSE-CAPILLARY: 133 mg/dL — AB (ref 65–99)
Glucose-Capillary: 116 mg/dL — ABNORMAL HIGH (ref 65–99)
Glucose-Capillary: 129 mg/dL — ABNORMAL HIGH (ref 65–99)
Glucose-Capillary: 160 mg/dL — ABNORMAL HIGH (ref 65–99)

## 2016-01-26 LAB — POCT I-STAT, CHEM 8
BUN: 22 mg/dL — AB (ref 6–20)
CALCIUM ION: 1.14 mmol/L — AB (ref 1.15–1.40)
CREATININE: 1.8 mg/dL — AB (ref 0.61–1.24)
Chloride: 97 mmol/L — ABNORMAL LOW (ref 101–111)
GLUCOSE: 137 mg/dL — AB (ref 65–99)
HCT: 29 % — ABNORMAL LOW (ref 39.0–52.0)
Hemoglobin: 9.9 g/dL — ABNORMAL LOW (ref 13.0–17.0)
Potassium: 4 mmol/L (ref 3.5–5.1)
Sodium: 134 mmol/L — ABNORMAL LOW (ref 135–145)
TCO2: 22 mmol/L (ref 0–100)

## 2016-01-26 LAB — BASIC METABOLIC PANEL
Anion gap: 9 (ref 5–15)
BUN: 13 mg/dL (ref 6–20)
CHLORIDE: 103 mmol/L (ref 101–111)
CO2: 22 mmol/L (ref 22–32)
CREATININE: 1.4 mg/dL — AB (ref 0.61–1.24)
Calcium: 8.1 mg/dL — ABNORMAL LOW (ref 8.9–10.3)
GFR calc non Af Amer: 50 mL/min — ABNORMAL LOW (ref >60)
GFR, EST AFRICAN AMERICAN: 59 mL/min — AB (ref >60)
Glucose, Bld: 140 mg/dL — ABNORMAL HIGH (ref 65–99)
POTASSIUM: 4.2 mmol/L (ref 3.5–5.1)
Sodium: 134 mmol/L — ABNORMAL LOW (ref 135–145)

## 2016-01-26 LAB — PREPARE FRESH FROZEN PLASMA
UNIT DIVISION: 0
Unit division: 0

## 2016-01-26 LAB — CREATININE, SERUM
Creatinine, Ser: 1.95 mg/dL — ABNORMAL HIGH (ref 0.61–1.24)
GFR calc non Af Amer: 34 mL/min — ABNORMAL LOW (ref >60)
GFR, EST AFRICAN AMERICAN: 39 mL/min — AB (ref >60)

## 2016-01-26 LAB — MAGNESIUM
Magnesium: 2.6 mg/dL — ABNORMAL HIGH (ref 1.7–2.4)
Magnesium: 2.6 mg/dL — ABNORMAL HIGH (ref 1.7–2.4)

## 2016-01-26 MED ORDER — FUROSEMIDE 10 MG/ML IJ SOLN
40.0000 mg | Freq: Two times a day (BID) | INTRAMUSCULAR | Status: DC
  Administered 2016-01-26 – 2016-01-28 (×4): 40 mg via INTRAVENOUS
  Filled 2016-01-26 (×4): qty 4

## 2016-01-26 MED ORDER — ALPRAZOLAM 0.5 MG PO TABS
0.5000 mg | ORAL_TABLET | Freq: Two times a day (BID) | ORAL | Status: DC | PRN
  Administered 2016-01-26 – 2016-01-27 (×2): 0.5 mg via ORAL
  Filled 2016-01-26 (×2): qty 1

## 2016-01-26 MED ORDER — NICOTINE 21 MG/24HR TD PT24
21.0000 mg | MEDICATED_PATCH | Freq: Every day | TRANSDERMAL | Status: DC
  Administered 2016-01-26 – 2016-02-04 (×10): 21 mg via TRANSDERMAL
  Filled 2016-01-26 (×10): qty 1

## 2016-01-26 MED ORDER — LEVALBUTEROL HCL 1.25 MG/0.5ML IN NEBU
1.2500 mg | INHALATION_SOLUTION | Freq: Four times a day (QID) | RESPIRATORY_TRACT | Status: DC | PRN
  Administered 2016-01-27 (×2): 1.25 mg via RESPIRATORY_TRACT
  Filled 2016-01-26 (×3): qty 0.5

## 2016-01-26 MED ORDER — AMIODARONE HCL 200 MG PO TABS: 200.0000 mg | ORAL_TABLET | Freq: Every day | ORAL | Status: DC

## 2016-01-26 MED ORDER — INSULIN ASPART 100 UNIT/ML ~~LOC~~ SOLN
0.0000 [IU] | SUBCUTANEOUS | Status: DC
  Administered 2016-01-26 – 2016-01-29 (×6): 2 [IU] via SUBCUTANEOUS

## 2016-01-26 MED ORDER — FUROSEMIDE 10 MG/ML IJ SOLN
40.0000 mg | Freq: Once | INTRAMUSCULAR | Status: AC
  Administered 2016-01-27: 40 mg via INTRAVENOUS
  Filled 2016-01-26: qty 4

## 2016-01-26 MED ORDER — AMIODARONE HCL 200 MG PO TABS
200.0000 mg | ORAL_TABLET | Freq: Two times a day (BID) | ORAL | Status: DC
  Administered 2016-01-26 – 2016-01-27 (×4): 200 mg via ORAL
  Filled 2016-01-26 (×4): qty 1

## 2016-01-26 MED ORDER — FUROSEMIDE 10 MG/ML IJ SOLN
20.0000 mg | Freq: Two times a day (BID) | INTRAMUSCULAR | Status: DC
  Administered 2016-01-26: 20 mg via INTRAVENOUS

## 2016-01-26 NOTE — Progress Notes (Signed)
1 Day Post-Op Procedure(s) (LRB): CORONARY ARTERY BYPASS GRAFTING (CABG) times five using left internal mammary artery and right saphenous vein. (N/A) TRANSESOPHAGEAL ECHOCARDIOGRAM (TEE) Subjective: remains extubated, PO2 57 this am nsr stable hemodynamics Chest tube drainage 350 cc last 12 hrs Objective: Vital signs in last 24 hours: Temp:  [96.4 F (35.8 C)-99.3 F (37.4 C)] 98.2 F (36.8 C) (09/12 0700) Pulse Rate:  [80-93] 88 (09/12 0700) Cardiac Rhythm: Atrial paced (09/12 0400) Resp:  [4-27] 17 (09/12 0700) BP: (89-116)/(48-81) 94/64 (09/12 0700) SpO2:  [91 %-100 %] 94 % (09/12 0708) Arterial Line BP: (92-142)/(48-91) 106/54 (09/12 0700) FiO2 (%):  [50 %-80 %] 80 % (09/11 1526) Weight:  [182 lb 15.7 oz (83 kg)] 182 lb 15.7 oz (83 kg) (09/12 0500)  Hemodynamic parameters for last 24 hours: PAP: (23-42)/(9-29) 27/13 CO:  [3.4 L/min-5.5 L/min] 5.5 L/min CI:  [1.7 L/min/m2-2.7 L/min/m2] 2.7 L/min/m2  Intake/Output from previous day: 09/11 0701 - 09/12 0700 In: 5935.9 [P.O.:20; I.V.:4016.9; Blood:1109; IV Piggyback:790] Out: 6645 [Urine:4250; Blood:1625; Chest Tube:770] Intake/Output this shift: No intake/output data recorded.       Exam    General- alert and comfortable   Lungs- clear without rales, wheezes   Cor- regular rate and rhythm, no murmur , gallop   Abdomen- soft, non-tender   Extremities - warm, non-tender, minimal edema   Neuro- oriented, appropriate, no focal weakness   Lab Results:  Recent Labs  01/25/16 1955 01/25/16 2006 01/26/16 0445  WBC 15.3*  --  11.3*  HGB 10.8* 9.9* 10.2*  HCT 32.6* 29.0* 30.8*  PLT 237  --  220   BMET:  Recent Labs  01/25/16 0237  01/25/16 2006 01/26/16 0445  NA 137  < > 136 134*  K 3.9  < > 4.7 4.2  CL 107  < > 102 103  CO2 22  --   --  22  GLUCOSE 94  < > 138* 140*  BUN 10  < > 11 13  CREATININE 1.27*  < > 1.20 1.40*  CALCIUM 8.8*  --   --  8.1*  < > = values in this interval not displayed.  PT/INR:   Recent Labs  01/25/16 1500  LABPROT 16.1*  INR 1.29   ABG    Component Value Date/Time   PHART 7.458 (H) 01/26/2016 0450   HCO3 23.2 01/26/2016 0450   TCO2 24 01/26/2016 0450   ACIDBASEDEF 4.0 (H) 01/25/2016 2000   O2SAT 91.0 01/26/2016 0450   CBG (last 3)   Recent Labs  01/26/16 0052 01/26/16 0421 01/26/16 0737  GLUCAP 160* 133* 138*    Assessment/Plan: S/P Procedure(s) (LRB): CORONARY ARTERY BYPASS GRAFTING (CABG) times five using left internal mammary artery and right saphenous vein. (N/A) TRANSESOPHAGEAL ECHOCARDIOGRAM (TEE) Mobilize Diuresis Diabetes control d/c tubes/lines keep in ICU to monitor pulmonary status   LOS: 3 days    [name] Trigt III 01/26/2016

## 2016-01-26 NOTE — Op Note (Signed)
Darryl George:  Darryl George, Darryl George                 ACCOUNT NO.:  [id number]  MEDICAL RECORD NO.:  [id number]  LOCATION:                                 FACILITY:  PHYSICIAN:  Kerin PernaPeter Van Trigt, M.D.  DATE OF BIRTH:  04-30-1951  DATE OF PROCEDURE:  01/25/2016 DATE OF DISCHARGE:                              OPERATIVE REPORT   OPERATION: 1. Coronary artery bypass grafting x5 (left internal mammary artery to     LAD, saphenous vein graft to second diagonal, sequential saphenous     vein graft to ramus intermedius and obtuse marginal, saphenous vein     graft to RCA). 2. Endoscopic harvest of right leg greater saphenous vein.  SURGEON:  Kerin PernaPeter Van Trigt, M.D.  ASSISTANT:  Steward Droneeffa Conte, PA-C.  ANESTHESIA:  General.  PREOPERATIVE DIAGNOSES:  Non ST-elevation myocardial infarction, unstable angina, severe three-vessel coronary artery disease, chronic obstructive pulmonary disease.  POSTOPERATIVE DIAGNOSES:  Non ST-elevation myocardial infarction, unstable angina, severe three-vessel coronary artery disease, chronic obstructive pulmonary disease.  CLINICAL NOTE:  The patient is a 65 year old Caucasian male, smoker, who presented to [facility] with crushing chest pain at rest and was found to have positive cardiac enzymes.  He was transferred to this hospital and underwent urgent cardiac catheterization by Dr. Katrinka BlazingSmith. This demonstrated a thrombus in the proximal RCA with decreased flow, but a patent vessel distally.  The patient also had high-grade stenosis of the LAD and circumflex systems.  LV function was fairly well preserved.  He was placed on IV heparin and nitroglycerin, and his chest pain resolved.  He was felt to be a candidate for surgical revascularization.  The patient was evaluated by Dr. Cornelius Moraswen in consultation who examined the patient and reviewed his cath films and agreed with the recommendation for coronary artery bypass grafting.  I discussed the patient in detail with Dr. Cornelius Moraswen  and proceeded with scheduling the patient for surgery earlier today.  Prior to surgery, I examined the patient in the preop holding area and again reviewed the results of his cardiac catheterization and discussed the indications for CABG for treatment of his severe CAD.  He understood the potential risks and alternative therapies as well.  He understood his risk of stroke, bleeding, MI, infection, and death.  He had previously given informed consent for the surgery.  FINDINGS: 1. Adequate conduit. 2. No packed cell transfusion required for the surgery. 3. Adequate, but small target vessels. 4. Persistent coagulopathy after reversal of heparin with protamine,     treated with 2 units of FFP.  OPERATIVE PROCEDURE:  The patient was brought to the operating room, placed supine on the operating table, general anesthesia was induced under invasive hemodynamic monitoring.  The chest, abdomen, and legs were prepped with Betadine and draped as a sterile field.  A proper time- out was performed.  A sternal incision was made as the saphenous vein was harvested endoscopically from the right leg.  The left internal mammary artery was harvested as a pedicle graft from its origin at the subclavian vessels. It was a 1.5-mm vessel with good flow.  The sternal retractor was placed, and the pericardium was opened and suspended.  The aorta was inspected, palpated, and examined.  There was no significant calcified plaque.  The aortic tissue was somewhat weakened probably by long history of smoking.  Pursestrings were placed in the ascending aorta and right atrium.  When the vein was harvested, the patient was heparinized and the ACT was documented as being therapeutic.  The cannulas were then placed for cardiopulmonary bypass, and the patient was placed on bypass.  The coronaries were identified for grafting, and the mammary artery and vein grafts were prepared for the distal anastomoses.   Cardioplegia cannulas were placed both antegrade and retrograde cold blood cardioplegia.  The patient was cooled to 32 degrees.  The crossclamp was applied.  One liter of cold blood cardioplegia was delivered in split doses between the antegrade aortic and retrograde coronary sinus catheters.  There was good cardioplegic arrest, and septal temperature dropped less than 14 degrees.  Cardioplegia was delivered every 20 minutes or less with the crossclamp was in place.  The distal coronary anastomoses were performed.  The first distal anastomosis was the RCA.  This had a proximal 95% stenosis.  A reverse saphenous vein was sewn end-to-side to this 1.5-mm vessel, and there was a good flow through the graft.  Cardioplegia was redosed.  The second and third distal anastomoses consisted of the sequential vein graft to the ramus intermedius and OM branch of the circumflex.  The ramus intermedius was 1.7 mm vessel with proximal 70% stenosis.  The reverse saphenous vein was sewn side-to-side with running 7-0 Prolene. The continuation of the vein was then directed to the OM branch of left circumflex.  There was a smaller 1.4-1.5-mm vessel.  The end of vein was sewn end-to-side with running 7-0 Prolene.  Cardioplegia was then delivered and there was good flow through the graft.  A full dose of cardioplegia was delivered.  The fourth distal anastomosis was the diagonal branch to LAD.  There was a 1.5-mm vessel with proximal 80-90% stenosis.  A reverse saphenous vein was sewn end-to-side with running 7-0 Prolene with good flow through the graft.  Cardioplegia was redosed.  The fifth distal anastomosis was the distal third of the LAD.  This was 1.5-mm vessel with proximal 90% stenosis.  The left IMA pedicle was brought through an opening, and the left lateral pericardium was brought down onto the LAD and sewn end to side with running 8-0 Prolene.  There was good flow through the anastomosis after  briefly releasing the bulldog on the mammary pedicle.  The bulldog was reapplied and the pedicle was secured to the epicardium.  Cardioplegia was redosed.  With the crossclamp still in place, 3 proximal vein anastomoses were performed on the ascending aorta using a 4.5 mm punch running 6-0 Prolene.  Prior to tying down the final proximal anastomosis, air was vented from the coronaries with a dose of retrograde warm blood cardioplegia.  The crossclamp was removed. The vein grafts were de-aired and opened and each had good flow and hemostasis was documented on the proximal distal anastomoses.  The patient was rewarmed and reperfused.  Temporary pacing wires were applied.  The lungs were expanded, ventilator was resumed.  The patient was weaned off cardiopulmonary bypass, on renal dose dopamine. Hemodynamics and echo showed good LV function.  Protamine was administered without adverse reaction.  There was still considerable diffuse oozing, and the patient was given FFP with improved coagulation function.  The superior pericardial fat was closed over the aorta.  Anterior mediastinal and left pleural  chest tubes were placed and brought out through separate incisions.  The sternum was closed with interrupted steel wire.  The pectoralis fascia was closed with running #1 Vicryl and skin layers were closed in running Vicryl and sterile dressings were applied.  The patient returned to the ICU in stable condition.  Total cardiopulmonary bypass time was 160 minutes.     Kerin Perna, M.D.   ______________________________ Kerin Perna, M.D.    PV/MEDQ  D:  01/25/2016  T:  01/26/2016  Job:  [id number]  cc:   Lyn Records, M.D.

## 2016-01-26 NOTE — Progress Notes (Signed)
CT surgery p.m. Rounds  Patient had a stable day with increased mobility and oxygen saturation remaining 90% Creatinine slowly rising now 1.9 Blood sugars well-controlled Hemoglobin stable blood pressure satisfactory We'll resume renal dopamine for rising postop creatinine

## 2016-01-26 NOTE — Progress Notes (Signed)
Patient Name: Darryl George Date of Encounter: 01/26/2016    SUBJECTIVE: The patient is extubated and having surgical pain. He feels very anxious and jittery. He does not drink.  Underwent successful surgery for multivessel coronary disease:     Coronary artery bypass grafting x5 (left internal mammary artery to     LAD, saphenous vein graft to second diagonal, sequential saphenous     vein graft to ramus intermedius and obtuse marginal, saphenous vein     graft to RCA).  TELEMETRY:  Normal sinus rhythm Vitals:   01/26/16 0900 01/26/16 1000 01/26/16 1100 01/26/16 1200  BP: 109/69 105/67 109/76 110/82  Pulse: 87 81 78 93  Resp: (!) 24 (!) 25 (!) 24 (!) 29  Temp:      TempSrc:      SpO2: 94% 97% 95% 92%  Weight:      Height:        Intake/Output Summary (Last 24 hours) at 01/26/16 1308 Last data filed at 01/26/16 1200  Gross per 24 hour  Intake           5596.6 ml  Output             6310 ml  Net           -713.4 ml   LABS: Basic Metabolic Panel:  Recent Labs  04/54/908/04/01 0237  01/25/16 1955 01/25/16 2006 01/26/16 0445  NA 137  < >  --  136 134*  K 3.9  < >  --  4.7 4.2  CL 107  < >  --  102 103  CO2 22  --   --   --  22  GLUCOSE 94  < >  --  138* 140*  BUN 10  < >  --  11 13  CREATININE 1.27*  < > 1.26* 1.20 1.40*  CALCIUM 8.8*  --   --   --  8.1*  MG  --   --  3.1*  --  2.6*  < > = values in this interval not displayed. CBC:  Recent Labs  01/23/16 1814  01/25/16 1955 01/25/16 2006 01/26/16 0445  WBC 13.2*  < > 15.3*  --  11.3*  NEUTROABS 7.9*  --   --   --   --   HGB 14.9  < > 10.8* 9.9* 10.2*  HCT 42.0  < > 32.6* 29.0* 30.8*  MCV 83.7  < > 86.5  --  86.3  PLT 350  < > 237  --  220  < > = values in this interval not displayed. Cardiac Enzymes:  Recent Labs  01/24/16 1424 01/24/16 1950 01/25/16 0237  TROPONINI 0.57* 0.44* 0.38*   BNP: Invalid input(s): POCBNP Hemoglobin A1C:  Recent Labs  01/24/16 1948  HGBA1C 5.4   Fasting  Lipid Panel:  Recent Labs  01/25/16 0237  CHOL 140  HDL 32*  LDLCALC 85  TRIG 811113  CHOLHDL 4.4    Radiology/Studies:  No new or relevant data  Physical Exam: Blood pressure 110/82, pulse 93, temperature 98.2 F (36.8 C), resp. rate (!) 29, height 6\' 1"  (1.854 m), weight 182 lb 15.7 oz (83 kg), SpO2 92 %. Weight change: 9 lb 7.7 oz (4.3 kg)  Wt Readings from Last 3 Encounters:  01/26/16 182 lb 15.7 oz (83 kg)  11/02/14 185 lb (83.9 kg)   Mediastinal tube draining; temporary pacer wires also in place. Chest is clear anterior Cardiac exam reveals no  rub or murmur. Extremities reveal mild puffiness. Neurologically intact although seems very nervous.  ASSESSMENT:  1. Multivessel coronary disease now status post 5 vessel bypass. 2. Cardiovascular status is stable post bypass without A. fib or evidence of pulmonary congestion  Plan:  1. Diuresis as tolerated 2. Monitor for arrhythmia 3. We will follow and help as needed  Signed, Lyn Records III 01/26/2016, 1:08 PM

## 2016-01-26 NOTE — Care Management Important Message (Signed)
Important Message  Patient Details  Name: Darryl George MRN: [id number] Date of Birth: [birth year]   Medicare Important Message Given:  Yes    [name] 01/26/2016, 11:30 AM

## 2016-01-26 NOTE — Anesthesia Postprocedure Evaluation (Signed)
Anesthesia Post Note  Patient: Darryl George  Procedure(s) Performed: Procedure(s) (LRB): CORONARY ARTERY BYPASS GRAFTING (CABG) times five using left internal mammary artery and right saphenous vein. (N/A) TRANSESOPHAGEAL ECHOCARDIOGRAM (TEE)  Patient location during evaluation: SICU Anesthesia Type: General Level of consciousness: sedated and patient remains intubated per anesthesia plan Pain management: pain level controlled Vital Signs Assessment: post-procedure vital signs reviewed and stable Respiratory status: patient remains intubated per anesthesia plan and patient on ventilator - see flowsheet for VS Cardiovascular status: stable Anesthetic complications: no    Last Vitals:  Vitals:   01/26/16 0745 01/26/16 0800  BP:  (!) 101/54  Pulse: 85 80  Resp: 20 17  Temp: 36.7 C 36.8 C    Last Pain:  Vitals:   01/26/16 0840  TempSrc:   PainSc: 3                  Cecile Hearing

## 2016-01-26 NOTE — Progress Notes (Signed)
Notified Dr. Donata Clay of urine output since first does of lasix.  New orders received.Marland Kitchen

## 2016-01-26 NOTE — Care Management Note (Signed)
Case Management Note  Patient Details  Name: Darryl George MRN: [id number] Date of Birth: 1950/06/25  Subjective/Objective:   Pt lives with significant other, has a sister who lives in [place]. Sister states she will determine whether family and friends will be available to provide 24/7 assistance for pt when he is medically stable for discharge.              Expected Discharge Plan:  Home/Self Care  Discharge planning Services  CM Consult  Status of Service:  In process, will continue to follow  Magdalene River, RN 01/26/2016, 1:14 PM

## 2016-01-26 NOTE — Progress Notes (Signed)
Chaplain responded to page for advanced directive; left AD documents w/ pt. for review. Pt. indicated he is waiting for family arriving after 2pm; nurse will call when pt. is ready.   Rev. Chaplain Kipp Brood MDiv ThM

## 2016-01-27 ENCOUNTER — Inpatient Hospital Stay (HOSPITAL_COMMUNITY): Payer: Medicare Other

## 2016-01-27 LAB — GLUCOSE, CAPILLARY
GLUCOSE-CAPILLARY: 105 mg/dL — AB (ref 65–99)
GLUCOSE-CAPILLARY: 129 mg/dL — AB (ref 65–99)
GLUCOSE-CAPILLARY: 113 mg/dL — AB (ref 65–99)
GLUCOSE-CAPILLARY: 126 mg/dL — AB (ref 65–99)
Glucose-Capillary: 128 mg/dL — ABNORMAL HIGH (ref 65–99)

## 2016-01-27 LAB — POCT I-STAT, CHEM 8
BUN: 23 mg/dL — ABNORMAL HIGH (ref 6–20)
BUN: 24 mg/dL — ABNORMAL HIGH (ref 6–20)
CALCIUM ION: 1.16 mmol/L (ref 1.15–1.40)
CHLORIDE: 97 mmol/L — AB (ref 101–111)
Calcium, Ion: 1.12 mmol/L — ABNORMAL LOW (ref 1.15–1.40)
Chloride: 96 mmol/L — ABNORMAL LOW (ref 101–111)
Creatinine, Ser: 1.6 mg/dL — ABNORMAL HIGH (ref 0.61–1.24)
Creatinine, Ser: 1.8 mg/dL — ABNORMAL HIGH (ref 0.61–1.24)
Glucose, Bld: 127 mg/dL — ABNORMAL HIGH (ref 65–99)
Glucose, Bld: 152 mg/dL — ABNORMAL HIGH (ref 65–99)
HCT: 27 % — ABNORMAL LOW (ref 39.0–52.0)
HCT: 27 % — ABNORMAL LOW (ref 39.0–52.0)
Hemoglobin: 9.2 g/dL — ABNORMAL LOW (ref 13.0–17.0)
Hemoglobin: 9.2 g/dL — ABNORMAL LOW (ref 13.0–17.0)
POTASSIUM: 3.7 mmol/L (ref 3.5–5.1)
Potassium: 4.1 mmol/L (ref 3.5–5.1)
SODIUM: 135 mmol/L (ref 135–145)
SODIUM: 134 mmol/L — AB (ref 135–145)
TCO2: 25 mmol/L (ref 0–100)
TCO2: 25 mmol/L (ref 0–100)

## 2016-01-27 LAB — CBC
HCT: 27.7 % — ABNORMAL LOW (ref 39.0–52.0)
Hemoglobin: 9.2 g/dL — ABNORMAL LOW (ref 13.0–17.0)
MCH: 29.1 pg (ref 26.0–34.0)
MCHC: 33.2 g/dL (ref 30.0–36.0)
MCV: 87.7 fL (ref 78.0–100.0)
Platelets: 219 10*3/uL (ref 150–400)
RBC: 3.16 MIL/uL — ABNORMAL LOW (ref 4.22–5.81)
RDW: 14.4 % (ref 11.5–15.5)
WBC: 14.4 10*3/uL — ABNORMAL HIGH (ref 4.0–10.5)

## 2016-01-27 LAB — BASIC METABOLIC PANEL
Anion gap: 7 (ref 5–15)
BUN: 23 mg/dL — ABNORMAL HIGH (ref 6–20)
CO2: 26 mmol/L (ref 22–32)
Calcium: 8.1 mg/dL — ABNORMAL LOW (ref 8.9–10.3)
Chloride: 99 mmol/L — ABNORMAL LOW (ref 101–111)
Creatinine, Ser: 1.82 mg/dL — ABNORMAL HIGH (ref 0.61–1.24)
GFR calc Af Amer: 43 mL/min — ABNORMAL LOW (ref >60)
GFR calc non Af Amer: 37 mL/min — ABNORMAL LOW (ref >60)
Glucose, Bld: 119 mg/dL — ABNORMAL HIGH (ref 65–99)
Potassium: 3.8 mmol/L (ref 3.5–5.1)
Sodium: 132 mmol/L — ABNORMAL LOW (ref 135–145)

## 2016-01-27 MED ORDER — DEXTROSE 5 % IV SOLN
2.0000 g | Freq: Two times a day (BID) | INTRAVENOUS | Status: DC
  Administered 2016-01-27 – 2016-02-02 (×13): 2 g via INTRAVENOUS
  Filled 2016-01-27 (×14): qty 2

## 2016-01-27 MED ORDER — POTASSIUM CHLORIDE 10 MEQ/50ML IV SOLN
10.0000 meq | INTRAVENOUS | Status: AC | PRN
  Administered 2016-01-27 (×3): 10 meq via INTRAVENOUS
  Filled 2016-01-27 (×2): qty 50

## 2016-01-27 MED ORDER — ALPRAZOLAM 0.5 MG PO TABS
0.5000 mg | ORAL_TABLET | Freq: Every evening | ORAL | Status: DC | PRN
  Administered 2016-01-27: 0.5 mg via ORAL
  Filled 2016-01-27: qty 1

## 2016-01-27 MED ORDER — OXYCODONE HCL 5 MG PO TABS
5.0000 mg | ORAL_TABLET | Freq: Four times a day (QID) | ORAL | Status: DC | PRN
  Administered 2016-01-27 – 2016-01-28 (×2): 5 mg via ORAL
  Filled 2016-01-27 (×2): qty 1

## 2016-01-27 MED ORDER — POTASSIUM CHLORIDE 10 MEQ/50ML IV SOLN
INTRAVENOUS | Status: AC
  Filled 2016-01-27: qty 50

## 2016-01-27 MED ORDER — LEVALBUTEROL HCL 1.25 MG/0.5ML IN NEBU
1.2500 mg | INHALATION_SOLUTION | Freq: Four times a day (QID) | RESPIRATORY_TRACT | Status: DC
  Administered 2016-01-28 – 2016-02-02 (×19): 1.25 mg via RESPIRATORY_TRACT
  Filled 2016-01-27 (×23): qty 0.5

## 2016-01-27 MED ORDER — FUROSEMIDE 10 MG/ML IJ SOLN
40.0000 mg | Freq: Once | INTRAMUSCULAR | Status: AC
  Administered 2016-01-27: 40 mg via INTRAVENOUS

## 2016-01-27 MED ORDER — THIAMINE HCL 100 MG/ML IJ SOLN
100.0000 mg | Freq: Every day | INTRAMUSCULAR | Status: DC
  Administered 2016-01-27 – 2016-02-03 (×8): 100 mg via INTRAVENOUS
  Filled 2016-01-27 (×8): qty 2

## 2016-01-27 NOTE — Progress Notes (Signed)
2 Days Post-Op Procedure(s) (LRB): CORONARY ARTERY BYPASS GRAFTING (CABG) times five using left internal mammary artery and right saphenous vein. (N/A) TRANSESOPHAGEAL ECHOCARDIOGRAM (TEE) Subjective: Borderline O2 sats  Needs 6 L-  Hx COPD , CABGx5 CXR with poss RLL pneumonia- tracheal aspirate sent- Elita Quick   Started Renal fx better on renal dopamine- 1.8 creat  Objective: Vital signs in last 24 hours: Temp:  [97.7 F (36.5 C)-98.8 F (37.1 C)] 97.7 F (36.5 C) (09/13 0749) Pulse Rate:  [78-102] 88 (09/13 0700) Cardiac Rhythm: Normal sinus rhythm (09/13 0600) Resp:  [13-32] 26 (09/13 0700) BP: (92-130)/(58-107) 97/62 (09/13 0700) SpO2:  [89 %-97 %] 89 % (09/13 0802) Weight:  [181 lb (82.1 kg)] 181 lb (82.1 kg) (09/13 0500)  Hemodynamic parameters for last 24 hours:  nsr  Intake/Output from previous day: 09/12 0701 - 09/13 0700 In: 1117.2 [P.O.:720; I.V.:397.2] Out: 1195 [Urine:845; Chest Tube:350] Intake/Output this shift: No intake/output data recorded.       Exam    General- alert and comfortable   Lungs- coarse BS with scattered wheezing   Cor- regular rate and rhythm, no murmur , gallop   Abdomen- soft, non-tender   Extremities - warm, non-tender, minimal edema   Neuro- oriented, appropriate, no focal weakness   Lab Results:  Recent Labs  01/26/16 1636 01/27/16 0300  WBC 16.7* 14.4*  HGB 9.9*  9.9* 9.2*  HCT 29.7*  29.0* 27.7*  PLT 229 219   BMET:  Recent Labs  01/26/16 0445 01/26/16 1636 01/27/16 0300  NA 134* 134* 132*  K 4.2 4.0 3.8  CL 103 97* 99*  CO2 22  --  26  GLUCOSE 140* 137* 119*  BUN 13 22* 23*  CREATININE 1.40* 1.95*  1.80* 1.82*  CALCIUM 8.1*  --  8.1*    PT/INR:  Recent Labs  01/25/16 1500  LABPROT 16.1*  INR 1.29   ABG    Component Value Date/Time   PHART 7.458 (H) 01/26/2016 0450   HCO3 23.2 01/26/2016 0450   TCO2 22 01/26/2016 1636   ACIDBASEDEF 4.0 (H) 01/25/2016 2000   O2SAT 91.0 01/26/2016 0450   CBG  (last 3)   Recent Labs  01/26/16 2346 01/27/16 0354 01/27/16 0745  GLUCAP 129* 105* 128*    Assessment/Plan: S/P Procedure(s) (LRB): CORONARY ARTERY BYPASS GRAFTING (CABG) times five using left internal mammary artery and right saphenous vein. (N/A) TRANSESOPHAGEAL ECHOCARDIOGRAM (TEE) Mobilize Diuresis cont renal dopamine and recheck labs this pm   LOS: 4 days    Darryl George 01/27/2016

## 2016-01-27 NOTE — Progress Notes (Signed)
Placed patient on BiPAP following continued and sustained SpO2 desaturation.

## 2016-01-27 NOTE — Progress Notes (Signed)
   Attempted to walk this morning. Developed some weakness. Still very H's.  No arrhythmia and hemodynamically stable.

## 2016-01-27 NOTE — Progress Notes (Signed)
MD Tyrone Sage paged regarding patient's desaturation on 6L [place]. Received orders to administer 40mg  Lasix once, and RRT to administer scheduled breathing treatment and place pt on BiPAP. Will continue to monitor the patient closely.  Marlou Porch

## 2016-01-27 NOTE — Progress Notes (Signed)
Patient ID: Darryl George, male   DOB: [birth year], 65 y.o.   MRN: [id number] EVENING ROUNDS NOTE :     [address]       [place] [address]             [phone]                 2 Days Post-Op Procedure(s) (LRB): CORONARY ARTERY BYPASS GRAFTING (CABG) times five using left internal mammary artery and right saphenous vein. (N/A) TRANSESOPHAGEAL ECHOCARDIOGRAM (TEE)  Total Length of Stay:  LOS: 4 days  BP 114/68   Pulse 89   Temp 98.4 F (36.9 C) (Oral)   Resp (!) 28   Ht 6' 1"  (1.854 m)   Wt 181 lb (82.1 kg)   SpO2 92%   BMI 23.88 kg/m   .Intake/Output      09/12 0701 - 09/13 0700 09/13 0701 - 09/14 0700   P.O. 720    I.V. (mL/kg) 397.2 (4.8) 163 (2)   Blood     IV Piggyback  200   Total Intake(mL/kg) 1117.2 (13.6) 363 (4.4)   Urine (mL/kg/hr) 885 (0.4) 900 (1)   Blood     Chest Tube 380 (0.2) 20 (0)   Total Output 1265 920   Net -147.8 -557          . sodium chloride 20 mL/hr at 01/26/16 0900  . sodium chloride    . sodium chloride    . DOPamine 2 mcg/kg/min (01/27/16 1800)  . lactated ringers 10 mL/hr at 01/27/16 1800  . lactated ringers    . phenylephrine (NEO-SYNEPHRINE) Adult infusion Stopped (01/26/16 0100)     Lab Results  Component Value Date   WBC 14.4 (H) 01/27/2016   HGB 9.2 (L) 01/27/2016   HCT 27.0 (L) 01/27/2016   PLT 219 01/27/2016   GLUCOSE 152 (H) 01/27/2016   CHOL 140 01/25/2016   TRIG 113 01/25/2016   HDL 32 (L) 01/25/2016   LDLCALC 85 01/25/2016   ALT 15 (L) 01/24/2016   AST 22 01/24/2016   NA 134 (L) 01/27/2016   K 4.1 01/27/2016   CL 96 (L) 01/27/2016   CREATININE 1.80 (H) 01/27/2016   BUN 24 (H) 01/27/2016   CO2 26 01/27/2016   INR 1.29 01/25/2016   HGBA1C 5.4 01/24/2016   Acute Kidney Injury (any one)  Increase in SCr by > 0.3 within 48 hours  Increase SCr to > 1.5 times baseline  Urine volume < 0.5 ml/kg/h for 6 hrs  ?Stage 1 - Increase in serum creatinine to 1.5 to 1.9 times baseline, or increase  in serum creatinine by ?0.3 mg/dL (?26.5 micromol/L), or reduction in urine output to <0.5 mL/kg per hour for 6 to 12 hours.  ?Stage 2 - Increase in serum creatinine to 2.0 to 2.9 times baseline, or reduction in urine output to <0.5 mL/kg per hour for ?12 hours.  ?Stage 3 - Increase in serum creatinine to 3.0 times baseline, or increase in serum creatinine to ?4.0 mg/dL (?353.6 micromol/L), or reduction in urine output to <0.3 mL/kg per hour for ?24 hours, or anuria for ?12 hours, or the initiation of renal replacement therapy, or, in patients <18 years, decrease in eGFR to <35 mL   Lab Results  Component Value Date   CREATININE 1.80 (H) 01/27/2016   Estimated Creatinine Clearance: 45 mL/min (by C-G formula based on SCr of 1.8 mg/dL (H)).  Stage1 Acute Kidney Injury  Anxious- on xanax  Grace Isaac MD  Beeper [phone] Office [phone] 01/27/2016 6:22 PM

## 2016-01-28 ENCOUNTER — Encounter (HOSPITAL_COMMUNITY): Payer: Self-pay | Admitting: Cardiothoracic Surgery

## 2016-01-28 ENCOUNTER — Inpatient Hospital Stay (HOSPITAL_COMMUNITY): Payer: Medicare Other

## 2016-01-28 DIAGNOSIS — I480 Paroxysmal atrial fibrillation: Secondary | ICD-10-CM

## 2016-01-28 DIAGNOSIS — I10 Essential (primary) hypertension: Secondary | ICD-10-CM

## 2016-01-28 LAB — GLUCOSE, CAPILLARY
GLUCOSE-CAPILLARY: 108 mg/dL — AB (ref 65–99)
Glucose-Capillary: 104 mg/dL — ABNORMAL HIGH (ref 65–99)
Glucose-Capillary: 107 mg/dL — ABNORMAL HIGH (ref 65–99)
Glucose-Capillary: 114 mg/dL — ABNORMAL HIGH (ref 65–99)
Glucose-Capillary: 118 mg/dL — ABNORMAL HIGH (ref 65–99)
Glucose-Capillary: 122 mg/dL — ABNORMAL HIGH (ref 65–99)
Glucose-Capillary: 138 mg/dL — ABNORMAL HIGH (ref 65–99)

## 2016-01-28 LAB — POCT I-STAT, CHEM 8
BUN: 27 mg/dL — AB (ref 6–20)
CHLORIDE: 95 mmol/L — AB (ref 101–111)
Calcium, Ion: 1.18 mmol/L (ref 1.15–1.40)
Creatinine, Ser: 1.5 mg/dL — ABNORMAL HIGH (ref 0.61–1.24)
Glucose, Bld: 140 mg/dL — ABNORMAL HIGH (ref 65–99)
HEMATOCRIT: 24 % — AB (ref 39.0–52.0)
Hemoglobin: 8.2 g/dL — ABNORMAL LOW (ref 13.0–17.0)
Potassium: 3.8 mmol/L (ref 3.5–5.1)
SODIUM: 134 mmol/L — AB (ref 135–145)
TCO2: 26 mmol/L (ref 0–100)

## 2016-01-28 LAB — CBC
HCT: 26.4 % — ABNORMAL LOW (ref 39.0–52.0)
Hemoglobin: 8.4 g/dL — ABNORMAL LOW (ref 13.0–17.0)
MCH: 28.3 pg (ref 26.0–34.0)
MCHC: 31.8 g/dL (ref 30.0–36.0)
MCV: 88.9 fL (ref 78.0–100.0)
Platelets: 256 10*3/uL (ref 150–400)
RBC: 2.97 MIL/uL — ABNORMAL LOW (ref 4.22–5.81)
RDW: 14.5 % (ref 11.5–15.5)
WBC: 15.8 10*3/uL — ABNORMAL HIGH (ref 4.0–10.5)

## 2016-01-28 LAB — BASIC METABOLIC PANEL
Anion gap: 9 (ref 5–15)
BUN: 24 mg/dL — ABNORMAL HIGH (ref 6–20)
CO2: 27 mmol/L (ref 22–32)
Calcium: 8.1 mg/dL — ABNORMAL LOW (ref 8.9–10.3)
Chloride: 98 mmol/L — ABNORMAL LOW (ref 101–111)
Creatinine, Ser: 1.59 mg/dL — ABNORMAL HIGH (ref 0.61–1.24)
GFR calc Af Amer: 50 mL/min — ABNORMAL LOW (ref >60)
GFR calc non Af Amer: 43 mL/min — ABNORMAL LOW (ref >60)
Glucose, Bld: 110 mg/dL — ABNORMAL HIGH (ref 65–99)
Potassium: 3.8 mmol/L (ref 3.5–5.1)
Sodium: 134 mmol/L — ABNORMAL LOW (ref 135–145)

## 2016-01-28 MED ORDER — AMIODARONE HCL IN DEXTROSE 360-4.14 MG/200ML-% IV SOLN
30.0000 mg/h | INTRAVENOUS | Status: DC
  Administered 2016-01-28 – 2016-01-30 (×4): 30 mg/h via INTRAVENOUS
  Filled 2016-01-28 (×5): qty 200

## 2016-01-28 MED ORDER — HALOPERIDOL LACTATE 5 MG/ML IJ SOLN
4.0000 mg | Freq: Once | INTRAMUSCULAR | Status: AC
  Administered 2016-01-28: 4 mg via INTRAVENOUS
  Filled 2016-01-28: qty 1

## 2016-01-28 MED ORDER — THROMBIN 5000 UNITS EX SOLR
CUTANEOUS | Status: DC | PRN
  Administered 2016-01-25: 5000 [IU] via TOPICAL

## 2016-01-28 MED ORDER — AMIODARONE HCL IN DEXTROSE 360-4.14 MG/200ML-% IV SOLN: 30.0000 mg/h | INTRAVENOUS | Status: DC

## 2016-01-28 MED ORDER — ALBUMIN HUMAN 25 % IV SOLN: 12.5000 g | Freq: Once | INTRAVENOUS | Status: DC

## 2016-01-28 MED ORDER — AMIODARONE LOAD VIA INFUSION
150.0000 mg | Freq: Once | INTRAVENOUS | Status: AC
  Administered 2016-01-28: 150 mg via INTRAVENOUS

## 2016-01-28 MED ORDER — DOPAMINE-DEXTROSE 3.2-5 MG/ML-% IV SOLN
2.0000 ug/kg/min | INTRAVENOUS | Status: DC
  Administered 2016-01-28: 2 ug/kg/min via INTRAVENOUS

## 2016-01-28 MED ORDER — CALCIUM CHLORIDE 10 % IV SOLN
1.0000 g | Freq: Once | INTRAVENOUS | Status: AC
  Administered 2016-01-28: 1 g via INTRAVENOUS

## 2016-01-28 MED ORDER — ALBUMIN HUMAN 5 % IV SOLN
INTRAVENOUS | Status: AC
  Filled 2016-01-28: qty 250

## 2016-01-28 MED ORDER — AMIODARONE HCL IN DEXTROSE 360-4.14 MG/200ML-% IV SOLN
INTRAVENOUS | Status: AC
  Administered 2016-01-28: 60 mg/h
  Filled 2016-01-28: qty 200

## 2016-01-28 MED ORDER — ALBUMIN HUMAN 5 % IV SOLN
12.5000 g | Freq: Once | INTRAVENOUS | Status: AC
  Administered 2016-01-28: 12.5 g via INTRAVENOUS

## 2016-01-28 MED ORDER — AMIODARONE HCL IN DEXTROSE 360-4.14 MG/200ML-% IV SOLN
60.0000 mg/h | INTRAVENOUS | Status: AC
  Administered 2016-01-28 (×2): 60 mg/h via INTRAVENOUS

## 2016-01-28 MED ORDER — FUROSEMIDE 10 MG/ML IJ SOLN
40.0000 mg | Freq: Every day | INTRAMUSCULAR | Status: DC
  Administered 2016-01-29: 40 mg via INTRAVENOUS
  Filled 2016-01-28: qty 4

## 2016-01-28 NOTE — Progress Notes (Signed)
Pt is continuing to push and pull with his arms after constantly being told not to. I have educated pt repeatedly. MD aware. Will continue to monitor closely.

## 2016-01-28 NOTE — Progress Notes (Signed)
Patient ID: Darryl George, male   DOB: 1951/04/11, 65 y.o.   MRN: [id number] SICU Evening Rounds:  Hemodynamically stable. Back in sinus rhythm on amio drip  Urine output ok Creat improving BMET    Component Value Date/Time   NA 134 (L) 01/28/2016 1744   K 3.8 01/28/2016 1744   CL 95 (L) 01/28/2016 1744   CO2 27 01/28/2016 0746   GLUCOSE 140 (H) 01/28/2016 1744   BUN 27 (H) 01/28/2016 1744   CREATININE 1.50 (H) 01/28/2016 1744   CALCIUM 8.1 (L) 01/28/2016 0746   GFRNONAA 43 (L) 01/28/2016 0746   GFRAA 50 (L) 01/28/2016 0746    Postop delirium. Sitter in room. Restrain as needed for safety.

## 2016-01-28 NOTE — Progress Notes (Signed)
3 Days Post-Op Procedure(s) (LRB): CORONARY ARTERY BYPASS GRAFTING (CABG) times five using left internal mammary artery and right saphenous vein. 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 for oxygen saturations Patient developed atrial fibrillation overnight and is now on IV amiodarone Acute renal failure continues to slowly improve with creatinine down to 1.59 Chest x-ray shows slight improvement in lung volumes Objective: Vital signs in last 24 hours: Temp:  [97 F (36.1 C)-98.4 F (36.9 C)] 97.8 F (36.6 C) (09/14 1207) Pulse Rate:  [73-139] 89 (09/14 1430) Cardiac Rhythm: A-V Sequential paced (09/14 1200) Resp:  [14-29] 24 (09/14 1430) BP: (73-128)/(41-97) 79/56 (09/14 1430) SpO2:  [75 %-99 %] 98 % (09/14 1430) FiO2 (%):  [55 %-84 %] 55 % (09/14 0839) Weight:  [184 lb 1.4 oz (83.5 kg)] 184 lb 1.4 oz (83.5 kg) (09/14 0500)  Hemodynamic parameters for last 24 hours:  atrial fibrillation  Intake/Output from previous day: 09/13 0701 - 09/14 0700 In: 592 [I.V.:342; IV Piggyback:250] Out: 2620 [Urine:2600; Chest Tube:20] Intake/Output this shift: Total I/O In: 540.3 [I.V.:240.3; IV Piggyback:300] Out: 550 [Urine:550]       Exam    General- alert and comfortable   Lungs- clear without rales, wheezes   Cor- regular rate and rhythm, no murmur , gallop   Abdomen- soft, non-tender   Extremities - warm, non-tender, minimal edema   Neuro- oriented, appropriate, no focal weakness   Lab Results:  Recent Labs  01/27/16 0300  01/27/16 1817 01/28/16 0746  WBC 14.4*  --   --  15.8*  HGB 9.2*  < > 9.2* 8.4*  HCT 27.7*  < > 27.0* 26.4*  PLT 219  --   --  256  < > = values in this interval not displayed. BMET:  Recent Labs  01/27/16 0300  01/27/16 1817 01/28/16 0746  NA 132*  < > 134* 134*  K 3.8  < > 4.1 3.8  CL 99*  < >  96* 98*  CO2 26  --   --  27  GLUCOSE 119*  < > 152* 110*  BUN 23*  < > 24* 24*  CREATININE 1.82*  < > 1.80* 1.59*  CALCIUM 8.1*  --   --  8.1*  < > = values in this interval not displayed.  PT/INR:  Recent Labs  01/25/16 1500  LABPROT 16.1*  INR 1.29   ABG    Component Value Date/Time   PHART 7.458 (H) 01/26/2016 0450   HCO3 23.2 01/26/2016 0450   TCO2 25 01/27/2016 1817   ACIDBASEDEF 4.0 (H) 01/25/2016 2000   O2SAT 91.0 01/26/2016 0450   CBG (last 3)   Recent Labs  01/28/16 0345 01/28/16 0750 01/28/16 1205  GLUCAP 107* 104* 114*    Assessment/Plan: S/P Procedure(s) (LRB): CORONARY ARTERY BYPASS GRAFTING (CABG) times five using left internal mammary artery and right saphenous vein. (N/A) TRANSESOPHAGEAL ECHOCARDIOGRAM (TEE) Mobilize Diuresis Diabetes control Continue IV Fortaz for possible aspiration pneumonia on chest x-ray Sputum culture shows gram-positive cocci, gram-negative rods  LOS: 5 days    Darryl George 01/28/2016

## 2016-01-28 NOTE — OR Nursing (Signed)
Late entry to charge for Thrombin used during case

## 2016-01-28 NOTE — Care Management Important Message (Signed)
Important Message  Patient Details  Name: Darryl George MRN: [id number] Date of Birth: 1950-06-14   Medicare Important Message Given:  Yes    Kyla Balzarine 01/28/2016, 10:46 AM

## 2016-01-28 NOTE — Progress Notes (Signed)
       Patient Name: Darryl George Date of Encounter: 01/28/2016    SUBJECTIVE: Staring and appearing anxious. Confused with inappropriate conversation. Again denies alcohol use.  TELEMETRY:  Atrial fibrillation with rapid ventricular response converting to a junctional rhythm while I was making rounds. Associated with hypotension. Vitals:   01/28/16 0757 01/28/16 0800 01/28/16 0839 01/28/16 0900  BP:  117/83  104/61  Pulse:  (!) 128  (!) 107  Resp:  15  20  Temp: 97 F (36.1 C)     TempSrc: Axillary     SpO2:  99% (!) 89% 92%  Weight:      Height:        Intake/Output Summary (Last 24 hours) at 01/28/16 1014 Last data filed at 01/28/16 0903  Gross per 24 hour  Intake              616 ml  Output             2135 ml  Net            -1519 ml   LABS: Basic Metabolic Panel:  Recent Labs  11/73/56 0445 01/26/16 1636 01/27/16 0300  01/27/16 1817 01/28/16 0746  NA 134* 134* 132*  < > 134* 134*  K 4.2 4.0 3.8  < > 4.1 3.8  CL 103 97* 99*  < > 96* 98*  CO2 22  --  26  --   --  27  GLUCOSE 140* 137* 119*  < > 152* 110*  BUN 13 22* 23*  < > 24* 24*  CREATININE 1.40* 1.95*  1.80* 1.82*  < > 1.80* 1.59*  CALCIUM 8.1*  --  8.1*  --   --  8.1*  MG 2.6* 2.6*  --   --   --   --   < > = values in this interval not displayed. CBC:  Recent Labs  01/27/16 0300  01/27/16 1817 01/28/16 0746  WBC 14.4*  --   --  15.8*  HGB 9.2*  < > 9.2* 8.4*  HCT 27.7*  < > 27.0* 26.4*  MCV 87.7  --   --  88.9  PLT 219  --   --  256  < > = values in this interval not displayed.  Radiology/Studies:  No new data  Physical Exam: Blood pressure 104/61, pulse (!) 107, temperature 97 F (36.1 C), temperature source Axillary, resp. rate 20, height 6\' 1"  (1.854 m), weight 184 lb 1.4 oz (83.5 kg), SpO2 92 %. Weight change: 3 lb 1.4 oz (1.4 kg)  Wt Readings from Last 3 Encounters:  01/28/16 184 lb 1.4 oz (83.5 kg)  11/02/14 185 lb (83.9 kg)   Currently on Ventimask. Staring and appears  anxious. Inappropriate conversation. No rales or wheezing heard. Cardiac exam is unremarkable with reference to gallop, rub, or murmur. Abdomen is soft.  ASSESSMENT:  1. Postoperative atrial fibrillation with rapid ventricular response. Now converted to junctional rhythm and temporary atrial pacing instituted via the postsurgical atrial leads. 2. Coronary artery disease with multivessel coronary bypass grafting 3. Altered mental status. Could be ICU psychosis versus withdrawal of some sort (has consistently denied alcohol use- pre-surgery). 4. Chronic heavy tobacco use  Plan:  1. Continue IV amiodarone 2. Atrial pace if junctional rhythm 3. Continue to monitor progress  Signed, Lyn Records III 01/28/2016, 10:14 AM

## 2016-01-29 ENCOUNTER — Inpatient Hospital Stay (HOSPITAL_COMMUNITY): Payer: Medicare Other

## 2016-01-29 DIAGNOSIS — Z7901 Long term (current) use of anticoagulants: Secondary | ICD-10-CM

## 2016-01-29 LAB — GLUCOSE, CAPILLARY
GLUCOSE-CAPILLARY: 133 mg/dL — AB (ref 65–99)
Glucose-Capillary: 81 mg/dL (ref 65–99)
Glucose-Capillary: 95 mg/dL (ref 65–99)
Glucose-Capillary: 83 mg/dL (ref 65–99)
Glucose-Capillary: 94 mg/dL (ref 65–99)
Glucose-Capillary: 87 mg/dL (ref 65–99)

## 2016-01-29 LAB — CBC
HCT: 26.2 % — ABNORMAL LOW (ref 39.0–52.0)
HCT: 23.4 % — ABNORMAL LOW (ref 39.0–52.0)
Hemoglobin: 8.5 g/dL — ABNORMAL LOW (ref 13.0–17.0)
Hemoglobin: 7.5 g/dL — ABNORMAL LOW (ref 13.0–17.0)
MCH: 28.6 pg (ref 26.0–34.0)
MCH: 28.2 pg (ref 26.0–34.0)
MCHC: 32.4 g/dL (ref 30.0–36.0)
MCHC: 32.1 g/dL (ref 30.0–36.0)
MCV: 88.2 fL (ref 78.0–100.0)
MCV: 88 fL (ref 78.0–100.0)
Platelets: 290 10*3/uL (ref 150–400)
Platelets: 290 10*3/uL (ref 150–400)
RBC: 2.97 MIL/uL — ABNORMAL LOW (ref 4.22–5.81)
RBC: 2.66 MIL/uL — ABNORMAL LOW (ref 4.22–5.81)
RDW: 14.5 % (ref 11.5–15.5)
RDW: 14.7 % (ref 11.5–15.5)
WBC: 14.6 10*3/uL — ABNORMAL HIGH (ref 4.0–10.5)
WBC: 11.9 10*3/uL — ABNORMAL HIGH (ref 4.0–10.5)

## 2016-01-29 LAB — POCT I-STAT 3, ART BLOOD GAS (G3+)
Acid-Base Excess: 1 mmol/L (ref 0.0–2.0)
Bicarbonate: 22.9 mmol/L (ref 20.0–28.0)
Bicarbonate: 22.7 mmol/L (ref 20.0–28.0)
O2 SAT: 90 %
O2 Saturation: 93 %
PH ART: 7.499 — AB (ref 7.350–7.450)
PO2 ART: 53 mmHg — AB (ref 83.0–108.0)
Patient temperature: 99.5
TCO2: 24 mmol/L (ref 0–100)
TCO2: 24 mmol/L (ref 0–100)
pCO2 arterial: 28.2 mmHg — ABNORMAL LOW (ref 32.0–48.0)
pCO2 arterial: 29.4 mmHg — ABNORMAL LOW (ref 32.0–48.0)
pH, Arterial: 7.52 — ABNORMAL HIGH (ref 7.350–7.450)
pO2, Arterial: 61 mmHg — ABNORMAL LOW (ref 83.0–108.0)

## 2016-01-29 LAB — COMPREHENSIVE METABOLIC PANEL
ALT: 32 U/L (ref 17–63)
AST: 43 U/L — ABNORMAL HIGH (ref 15–41)
Albumin: 2.5 g/dL — ABNORMAL LOW (ref 3.5–5.0)
Alkaline Phosphatase: 149 U/L — ABNORMAL HIGH (ref 38–126)
Anion gap: 12 (ref 5–15)
BUN: 27 mg/dL — ABNORMAL HIGH (ref 6–20)
CO2: 26 mmol/L (ref 22–32)
Calcium: 8.4 mg/dL — ABNORMAL LOW (ref 8.9–10.3)
Chloride: 97 mmol/L — ABNORMAL LOW (ref 101–111)
Creatinine, Ser: 1.62 mg/dL — ABNORMAL HIGH (ref 0.61–1.24)
GFR calc Af Amer: 49 mL/min — ABNORMAL LOW (ref >60)
GFR calc non Af Amer: 42 mL/min — ABNORMAL LOW (ref >60)
Glucose, Bld: 122 mg/dL — ABNORMAL HIGH (ref 65–99)
Potassium: 3.5 mmol/L (ref 3.5–5.1)
Sodium: 135 mmol/L (ref 135–145)
Total Bilirubin: 1.3 mg/dL — ABNORMAL HIGH (ref 0.3–1.2)
Total Protein: 5.5 g/dL — ABNORMAL LOW (ref 6.5–8.1)

## 2016-01-29 LAB — POCT I-STAT, CHEM 8
BUN: 30 mg/dL — AB (ref 6–20)
CREATININE: 1.7 mg/dL — AB (ref 0.61–1.24)
Calcium, Ion: 1.08 mmol/L — ABNORMAL LOW (ref 1.15–1.40)
Chloride: 100 mmol/L — ABNORMAL LOW (ref 101–111)
Glucose, Bld: 99 mg/dL (ref 65–99)
HEMATOCRIT: 19 % — AB (ref 39.0–52.0)
HEMOGLOBIN: 6.5 g/dL — AB (ref 13.0–17.0)
POTASSIUM: 3.9 mmol/L (ref 3.5–5.1)
SODIUM: 136 mmol/L (ref 135–145)
TCO2: 26 mmol/L (ref 0–100)

## 2016-01-29 LAB — CULTURE, RESPIRATORY W GRAM STAIN
Culture: NORMAL
Gram Stain: NONE SEEN

## 2016-01-29 LAB — PREPARE RBC (CROSSMATCH)

## 2016-01-29 MED ORDER — LORAZEPAM 2 MG/ML IJ SOLN
INTRAMUSCULAR | Status: AC
  Filled 2016-01-29: qty 1

## 2016-01-29 MED ORDER — ALBUMIN HUMAN 5 % IV SOLN
12.5000 g | Freq: Four times a day (QID) | INTRAVENOUS | Status: AC
Start: 2016-01-29 — End: 2016-01-29
  Administered 2016-01-29 (×3): 12.5 g via INTRAVENOUS
  Filled 2016-01-29 (×3): qty 250

## 2016-01-29 MED ORDER — ORAL CARE MOUTH RINSE
15.0000 mL | Freq: Two times a day (BID) | OROMUCOSAL | Status: DC
  Administered 2016-01-29 – 2016-02-04 (×10): 15 mL via OROMUCOSAL

## 2016-01-29 MED ORDER — DEXMEDETOMIDINE HCL IN NACL 200 MCG/50ML IV SOLN
0.2000 ug/kg/h | INTRAVENOUS | Status: DC
  Administered 2016-01-29: 0.2 ug/kg/h via INTRAVENOUS
  Filled 2016-01-29 (×2): qty 50

## 2016-01-29 MED ORDER — LORAZEPAM 2 MG/ML IJ SOLN
1.0000 mg | INTRAMUSCULAR | Status: DC | PRN
  Administered 2016-01-29 – 2016-01-30 (×6): 1 mg via INTRAVENOUS
  Filled 2016-01-29 (×5): qty 1

## 2016-01-29 MED ORDER — POTASSIUM CHLORIDE 10 MEQ/50ML IV SOLN
10.0000 meq | INTRAVENOUS | Status: AC
  Administered 2016-01-29 (×3): 10 meq via INTRAVENOUS
  Filled 2016-01-29 (×3): qty 50

## 2016-01-29 MED ORDER — LORAZEPAM 2 MG/ML IJ SOLN: 1.0000 mg | Freq: Once | INTRAMUSCULAR | Status: AC

## 2016-01-29 MED ORDER — HALOPERIDOL LACTATE 5 MG/ML IJ SOLN
4.0000 mg | Freq: Once | INTRAMUSCULAR | Status: AC
  Administered 2016-01-29: 4 mg via INTRAVENOUS
  Filled 2016-01-29: qty 1

## 2016-01-29 MED ORDER — LORAZEPAM 2 MG/ML IJ SOLN
1.0000 mg | Freq: Once | INTRAMUSCULAR | Status: AC
  Administered 2016-01-29: 1 mg via INTRAVENOUS

## 2016-01-29 NOTE — Progress Notes (Signed)
01/29/2016 0730 Pt. Noted with marked confusion and agitation this am. Dr. Donata Clay on floor and made aware. Verbal order received to obtain ABG. ABG results called to MD. Verbal order received to increase to highest setting on Venturi mask. Orders enacted. Will continue to closely monitor patient.  Darryl George, Blanchard Kelch

## 2016-01-29 NOTE — Progress Notes (Signed)
4 Days Post-Op Procedure(s) (LRB): CORONARY ARTERY BYPASS GRAFTING (CABG) times five using left internal mammary artery and right saphenous vein. (N/A) TRANSESOPHAGEAL ECHOCARDIOGRAM (TEE) Subjective: CABG 5 for unstable angina Continues with postoperative problems with A. fib, acute pulmonary failure on chronic severe COPD, delirium, acute renal failure Patient had all narcotics stopped and sedation minimized, patient requires restraints and bedside sitter Oxygen saturation borderline patient unable to use incentive spirometry or cough Chest x-ray shows atelectasis left lung base-continuing IV Fortaz for possible pneumonia  Objective: Vital signs in last 24 hours: Temp:  [97.6 F (36.4 C)-99.5 F (37.5 C)] 99.5 F (37.5 C) (09/15 0733) Pulse Rate:  [70-127] 114 (09/15 1000) Cardiac Rhythm: Atrial fibrillation (09/15 0800) Resp:  [16-44] 33 (09/15 0900) BP: (73-136)/(47-97) 119/92 (09/15 1000) SpO2:  [83 %-99 %] 89 % (09/15 1000) FiO2 (%):  [45 %-55 %] 55 % (09/15 0935) Weight:  [179 lb 7.3 oz (81.4 kg)] 179 lb 7.3 oz (81.4 kg) (09/15 0500)  Hemodynamic parameters for last 24 hours:  atrial fibrillation  Intake/Output from previous day: 09/14 0701 - 09/15 0700 In: 1149.7 [I.V.:799.7; IV Piggyback:350] Out: 995 [Urine:995] Intake/Output this shift: Total I/O In: 310.1 [I.V.:110.1; IV Piggyback:200] Out: 725 [Urine:725]  Patient awake but disoriented agitated consistent with delirium moving all extremities Diminished breath sounds left base Sternal incision intact Minimal peripheral edema Abdomen nondistended  Lab Results:  Recent Labs  01/28/16 0746 01/28/16 1744 01/29/16 0518  WBC 15.8*  --  14.6*  HGB 8.4* 8.2* 8.5*  HCT 26.4* 24.0* 26.2*  PLT 256  --  290   BMET:  Recent Labs  01/28/16 0746 01/28/16 1744 01/29/16 0518  NA 134* 134* 135  K 3.8 3.8 3.5  CL 98* 95* 97*  CO2 27  --  26  GLUCOSE 110* 140* 122*  BUN 24* 27* 27*  CREATININE 1.59* 1.50*  1.62*  CALCIUM 8.1*  --  8.4*    PT/INR: No results for input(s): LABPROT, INR in the last 72 hours. ABG    Component Value Date/Time   PHART 7.520 (H) 01/29/2016 0901   HCO3 22.9 01/29/2016 0901   TCO2 24 01/29/2016 0901   ACIDBASEDEF 4.0 (H) 01/25/2016 2000   O2SAT 90.0 01/29/2016 0901   CBG (last 3)   Recent Labs  01/28/16 2352 01/29/16 0523 01/29/16 0822  GLUCAP 122* 133* 81    Assessment/Plan: S/P Procedure(s) (LRB): CORONARY ARTERY BYPASS GRAFTING (CABG) times five using left internal mammary artery and right saphenous vein. (N/A) TRANSESOPHAGEAL ECHOCARDIOGRAM (TEE) Continue treatment for delirium by avoiding narcotics, safety issues require bedside sitter and restraints Oxygen support with bronchodilators for acute on chronic pulmonary insufficiency  LOS: 6 days    Darryl George 01/29/2016

## 2016-01-29 NOTE — Progress Notes (Signed)
Patient Name: Darryl George Date of Encounter: 01/29/2016    SUBJECTIVE: Sister and brother long or at bedside. Patient is confused and talking to himself.  TELEMETRY:  Sinus tachycardia with PACs Vitals:   01/29/16 0800 01/29/16 0900 01/29/16 1000 01/29/16 1200  BP: 117/72 110/60 (!) 119/92 124/75  Pulse: 93 84 (!) 114 (!) 127  Resp: (!) 40 (!) 33  (!) 25  Temp:    (!) 100.5 F (38.1 C)  TempSrc:    Oral  SpO2: 92% 94% (!) 89% (!) 88%  Weight:      Height:        Intake/Output Summary (Last 24 hours) at 01/29/16 1318 Last data filed at 01/29/16 1200  Gross per 24 hour  Intake          1509.78 ml  Output             1670 ml  Net          -160.22 ml   LABS: Basic Metabolic Panel:  Recent Labs  86/57/8408/05/01 1636  01/28/16 0746 01/28/16 1744 01/29/16 0518  NA 134*  < > 134* 134* 135  K 4.0  < > 3.8 3.8 3.5  CL 97*  < > 98* 95* 97*  CO2  --   < > 27  --  26  GLUCOSE 137*  < > 110* 140* 122*  BUN 22*  < > 24* 27* 27*  CREATININE 1.95*  1.80*  < > 1.59* 1.50* 1.62*  CALCIUM  --   < > 8.1*  --  8.4*  MG 2.6*  --   --   --   --   < > = values in this interval not displayed. CBC:  Recent Labs  01/28/16 0746 01/28/16 1744 01/29/16 0518  WBC 15.8*  --  14.6*  HGB 8.4* 8.2* 8.5*  HCT 26.4* 24.0* 26.2*  MCV 88.9  --  88.2  PLT 256  --  290   Cardiac Enzymes: No results for input(s): CKTOTAL, CKMB, CKMBINDEX, TROPONINI in the last 72 hours. BNP: Invalid input(s): POCBNP Hemoglobin A1C: No results for input(s): HGBA1C in the last 72 hours. Fasting Lipid Panel: No results for input(s): CHOL, HDL, LDLCALC, TRIG, CHOLHDL, LDLDIRECT in the last 72 hours.  Radiology/Studies:  01/29/16 chest x-ray: IMPRESSION: 1. Cardiac enlargement and bilateral pleural effusions noted. Decreased aeration the left lung compared with previous exam.  Physical Exam: Blood pressure 124/75, pulse (!) 127, temperature (!) 100.5 F (38.1 C), temperature source Oral, resp. rate  (!) 25, height 6\' 1"  (1.854 m), weight 179 lb 7.3 oz (81.4 kg), SpO2 (!) 88 %. Weight change: -4 lb 10.1 oz (-2.1 kg)  Wt Readings from Last 3 Encounters:  01/29/16 179 lb 7.3 oz (81.4 kg)  11/02/14 185 lb (83.9 kg)   Patient lying in bed with the size closed and Ventimask in place. He arouses and becomes somewhat agitated. He is restrained. Course rhonchi are heard with auscultation of the lungs. Cardiac exam reveals no gallop or rub. Rhythm is tachycardic and irregular. Neuro exam is consistent with delirium. No focal deficits  ASSESSMENT:  1. Post CABG for multivessel coronary disease, stable without evidence of ischemia 2. Postoperative atrial fibrillation, currently on amiodarone intravenously, but not yet well controlled. 3. Delirium, postop without any history of substances from which she could be withdrawing. 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 Continue IV amiodarone 2. IV beta blocker therapy to control rate as needed.  3. Depending upon duration of atrial fibrillation, may need initiation of anticoagulation therapy. Will follow and advise.  Signed, Lyn Records III 01/29/2016, 1:18 PM

## 2016-01-29 NOTE — Progress Notes (Signed)
CT surgery p.m. Rounds  Patient with significant delirium, acute on chronic respiratory insufficiency, acute renal insufficiency and atrial fibrillation. Mental status alteration improved with IV Ativan P.m. labs show significant anemia hemoglobin 7.5-we'll transfuse one unit packed cells Maintaining sinus rhythm Oxygen saturation 90% on Ventimask Remains nothing by mouth while with altered mental status

## 2016-01-29 NOTE — Progress Notes (Signed)
01/29/2016 1450 Dr. Donata Clay paged and made aware of continued confusion and agitation as well as positive results to IV ativan administration. Order received for Ativan 1 mg iv Q3H prn and to repeat ABG at 1700. Orders enacted. Will continue to closely monitor patient.  Elania Crowl, Blanchard Kelch

## 2016-01-29 NOTE — Progress Notes (Signed)
01/29/2016 1840 Precedex gtt stopped due to hypotension. Pt. Resting comfortable in bed at this time. Will continue to closely monitor patient.  Mansi Tokar, Blanchard Kelch

## 2016-01-29 NOTE — Progress Notes (Signed)
Placed patient on 15L high flow nasal cannula, due to inability to add humidification to a venturi mask as MD has requested. Patient is tolerating fairly well and SpO2 rose to 95-97%. RT will continue to monitor.

## 2016-01-29 NOTE — Progress Notes (Signed)
01/29/2016 1000 Nursing note Pt. With continued confusion, agitation and combativeness. Concerns for patient safety. MD paged and made aware. Verbal order received for 4 mg IV haldol x 1, and initiate precedex gtt. Orders enacted. Upon reassessment of patient. Pt. Still agitated. Verbal order received Dr. Donata Clay for 1 mg Ativan IV x 1. Orders enacted. Pt. Responded well to Ativan. Resting comfortably in bed.  Darryl George, Blanchard Kelch

## 2016-01-30 ENCOUNTER — Inpatient Hospital Stay (HOSPITAL_COMMUNITY): Payer: Medicare Other

## 2016-01-30 LAB — CBC
HCT: 25.2 % — ABNORMAL LOW (ref 39.0–52.0)
HCT: 25.9 % — ABNORMAL LOW (ref 39.0–52.0)
Hemoglobin: 8 g/dL — ABNORMAL LOW (ref 13.0–17.0)
Hemoglobin: 8.4 g/dL — ABNORMAL LOW (ref 13.0–17.0)
MCH: 28 pg (ref 26.0–34.0)
MCH: 28.3 pg (ref 26.0–34.0)
MCHC: 31.7 g/dL (ref 30.0–36.0)
MCHC: 32.4 g/dL (ref 30.0–36.0)
MCV: 88.1 fL (ref 78.0–100.0)
MCV: 87.2 fL (ref 78.0–100.0)
Platelets: 290 10*3/uL (ref 150–400)
Platelets: 330 10*3/uL (ref 150–400)
RBC: 2.86 MIL/uL — ABNORMAL LOW (ref 4.22–5.81)
RBC: 2.97 MIL/uL — ABNORMAL LOW (ref 4.22–5.81)
RDW: 14.9 % (ref 11.5–15.5)
RDW: 14.8 % (ref 11.5–15.5)
WBC: 9.6 10*3/uL (ref 4.0–10.5)
WBC: 11.1 10*3/uL — ABNORMAL HIGH (ref 4.0–10.5)

## 2016-01-30 LAB — GLUCOSE, CAPILLARY
GLUCOSE-CAPILLARY: 109 mg/dL — AB (ref 65–99)
GLUCOSE-CAPILLARY: 115 mg/dL — AB (ref 65–99)
GLUCOSE-CAPILLARY: 99 mg/dL (ref 65–99)
Glucose-Capillary: 101 mg/dL — ABNORMAL HIGH (ref 65–99)
Glucose-Capillary: 102 mg/dL — ABNORMAL HIGH (ref 65–99)
Glucose-Capillary: 97 mg/dL (ref 65–99)

## 2016-01-30 LAB — POCT I-STAT, CHEM 8
BUN: 28 mg/dL — AB (ref 6–20)
CALCIUM ION: 1.04 mmol/L — AB (ref 1.15–1.40)
CHLORIDE: 99 mmol/L — AB (ref 101–111)
Creatinine, Ser: 1.3 mg/dL — ABNORMAL HIGH (ref 0.61–1.24)
GLUCOSE: 179 mg/dL — AB (ref 65–99)
HCT: 19 % — ABNORMAL LOW (ref 39.0–52.0)
Hemoglobin: 6.5 g/dL — CL (ref 13.0–17.0)
Potassium: 3.7 mmol/L (ref 3.5–5.1)
SODIUM: 135 mmol/L (ref 135–145)
TCO2: 22 mmol/L (ref 0–100)

## 2016-01-30 LAB — COMPREHENSIVE METABOLIC PANEL
ALT: 58 U/L (ref 17–63)
AST: 79 U/L — ABNORMAL HIGH (ref 15–41)
Albumin: 2.7 g/dL — ABNORMAL LOW (ref 3.5–5.0)
Alkaline Phosphatase: 198 U/L — ABNORMAL HIGH (ref 38–126)
Anion gap: 14 (ref 5–15)
BUN: 34 mg/dL — ABNORMAL HIGH (ref 6–20)
CO2: 23 mmol/L (ref 22–32)
Calcium: 8.1 mg/dL — ABNORMAL LOW (ref 8.9–10.3)
Chloride: 102 mmol/L (ref 101–111)
Creatinine, Ser: 1.72 mg/dL — ABNORMAL HIGH (ref 0.61–1.24)
GFR calc Af Amer: 46 mL/min — ABNORMAL LOW (ref >60)
GFR calc non Af Amer: 39 mL/min — ABNORMAL LOW (ref >60)
Glucose, Bld: 100 mg/dL — ABNORMAL HIGH (ref 65–99)
Potassium: 3.5 mmol/L (ref 3.5–5.1)
Sodium: 139 mmol/L (ref 135–145)
Total Bilirubin: 1.3 mg/dL — ABNORMAL HIGH (ref 0.3–1.2)
Total Protein: 5.2 g/dL — ABNORMAL LOW (ref 6.5–8.1)

## 2016-01-30 LAB — BLOOD GAS, ARTERIAL
Acid-Base Excess: 0.5 mmol/L (ref 0.0–2.0)
Bicarbonate: 23.7 mmol/L (ref 20.0–28.0)
Drawn by: 252031
O2 Content: 9 L/min
O2 Saturation: 85.6 %
Patient temperature: 98.6
pCO2 arterial: 32.6 mmHg (ref 32.0–48.0)
pH, Arterial: 7.475 — ABNORMAL HIGH (ref 7.350–7.450)
pO2, Arterial: 52.7 mmHg — ABNORMAL LOW (ref 83.0–108.0)

## 2016-01-30 MED ORDER — LORAZEPAM 2 MG/ML IJ SOLN: 0.5000 mg | Freq: Four times a day (QID) | INTRAMUSCULAR | Status: DC | PRN

## 2016-01-30 MED ORDER — SODIUM CHLORIDE 0.9% FLUSH
10.0000 mL | Freq: Two times a day (BID) | INTRAVENOUS | Status: DC
  Administered 2016-01-30 – 2016-01-31 (×3): 10 mL
  Administered 2016-01-31: 20 mL
  Administered 2016-02-01 – 2016-02-02 (×2): 10 mL

## 2016-01-30 MED ORDER — POTASSIUM CHLORIDE 10 MEQ/50ML IV SOLN
10.0000 meq | INTRAVENOUS | Status: AC
  Administered 2016-01-30 (×3): 10 meq via INTRAVENOUS
  Filled 2016-01-30 (×3): qty 50

## 2016-01-30 MED ORDER — FUROSEMIDE 10 MG/ML IJ SOLN
40.0000 mg | Freq: Two times a day (BID) | INTRAMUSCULAR | Status: DC
  Administered 2016-01-30 (×2): 40 mg via INTRAVENOUS
  Filled 2016-01-30 (×2): qty 4

## 2016-01-30 MED ORDER — SODIUM CHLORIDE 0.9 % IV SOLN
Freq: Once | INTRAVENOUS | Status: AC
  Administered 2016-01-30: 22:00:00 via INTRAVENOUS

## 2016-01-30 MED ORDER — SODIUM CHLORIDE 0.9% FLUSH
10.0000 mL | INTRAVENOUS | Status: DC | PRN
  Administered 2016-02-02: 20 mL
  Administered 2016-02-03: 10 mL
  Filled 2016-01-30 (×2): qty 40

## 2016-01-30 MED ORDER — POTASSIUM CHLORIDE 10 MEQ/50ML IV SOLN
10.0000 meq | INTRAVENOUS | Status: AC | PRN
  Administered 2016-01-30 (×3): 10 meq via INTRAVENOUS
  Filled 2016-01-30 (×3): qty 50

## 2016-01-30 MED ORDER — LORAZEPAM 2 MG/ML IJ SOLN: 1.0000 mg | Freq: Four times a day (QID) | INTRAMUSCULAR | Status: DC | PRN

## 2016-01-30 MED ORDER — AMIODARONE HCL 200 MG PO TABS
200.0000 mg | ORAL_TABLET | Freq: Two times a day (BID) | ORAL | Status: DC
  Administered 2016-01-30 – 2016-02-04 (×10): 200 mg via ORAL
  Filled 2016-01-30 (×10): qty 1

## 2016-01-30 NOTE — Progress Notes (Signed)
Peripherally Inserted Central Catheter/Midline Placement  The IV Nurse has discussed with the patient and/or persons authorized to consent for the patient, the purpose of this procedure and the potential benefits and risks involved with this procedure.  The benefits include less needle sticks, lab draws from the catheter, and the patient may be discharged home with the catheter. Risks include, but not limited to, infection, bleeding, blood clot (thrombus formation), and puncture of an artery; nerve damage and irregular heartbeat and possibility to perform a PICC exchange if needed/ordered by physician.  Alternatives to this procedure were also discussed.  Bard Power PICC patient education guide, fact sheet on infection prevention and patient information card has been provided to patient /or left at bedside.    PICC/Midline Placement Documentation  PICC Double Lumen 01/30/16 PICC Right Brachial 37 cm 0 cm (Active)  Indication for Insertion or Continuance of Line Prolonged intravenous therapies 01/30/2016 12:32 PM  Exposed Catheter (cm) 0 cm 01/30/2016 12:32 PM  Site Assessment Clean;Dry;Intact 01/30/2016 12:32 PM  Lumen #1 Status Flushed;Saline locked;Blood return noted 01/30/2016 12:32 PM  Lumen #2 Status Flushed;Saline locked;Blood return noted 01/30/2016 12:32 PM  Dressing Type Transparent 01/30/2016 12:32 PM  Dressing Status Clean;Dry;Intact 01/30/2016 12:32 PM  Dressing Change Due 02/06/16 01/30/2016 12:32 PM       Ethelda Chick 01/30/2016, 12:46 PM

## 2016-01-30 NOTE — Progress Notes (Addendum)
Patient Name: Darryl BreedGary R Gohman Date of Encounter: 01/30/2016    SUBJECTIVE: Sister and brother long or at bedside. Patient is confused and talking to himself. Had PICC line placed today. In sinus rhythm today.    TELEMETRY:  Sinus tachycardia with PACs Vitals:   01/30/16 0600 01/30/16 0700 01/30/16 0759 01/30/16 0905  BP: 106/70 119/76    Pulse: 88 88    Resp: 20 (!) 35    Temp:    98.2 F (36.8 C)  TempSrc:    Oral  SpO2: 100% 96% 100%   Weight:      Height:        Intake/Output Summary (Last 24 hours) at 01/30/16 1241 Last data filed at 01/30/16 [id number]  Gross per 24 hour  Intake          1479.79 ml  Output              855 ml  Net           624.79 ml   LABS: Basic Metabolic Panel:  Recent Labs  19/14/7809/15/17 0518 01/29/16 1556 01/30/16 0411  NA 135 136 139  K 3.5 3.9 3.5  CL 97* 100* 102  CO2 26  --  23  GLUCOSE 122* 99 100*  BUN 27* 30* 34*  CREATININE 1.62* 1.70* 1.72*  CALCIUM 8.4*  --  8.1*   CBC:  Recent Labs  01/29/16 1630 01/30/16 0411  WBC 11.9* 9.6  HGB 7.5* 8.0*  HCT 23.4* 25.2*  MCV 88.0 88.1  PLT 290 290   Cardiac Enzymes: No results for input(s): CKTOTAL, CKMB, CKMBINDEX, TROPONINI in the last 72 hours. BNP: Invalid input(s): POCBNP Hemoglobin A1C: No results for input(s): HGBA1C in the last 72 hours. Fasting Lipid Panel: No results for input(s): CHOL, HDL, LDLCALC, TRIG, CHOLHDL, LDLDIRECT in the last 72 hours.  Radiology/Studies:  01/29/16 chest x-ray: IMPRESSION: 1. Cardiac enlargement and bilateral pleural effusions noted. Decreased aeration the left lung compared with previous exam.  Physical Exam: Blood pressure 119/76, pulse 88, temperature 98.2 F (36.8 C), temperature source Oral, resp. rate (!) 35, height 6\' 1"  (1.854 m), weight 185 lb 13.6 oz (84.3 kg), SpO2 100 %. Weight change: 6 lb 6.3 oz (2.9 kg)  Wt Readings from Last 3 Encounters:  01/30/16 185 lb 13.6 oz (84.3 kg)  11/02/14 185 lb (83.9 kg)   General: Well  developed, well nourished,male in no acute distress. Confused Head: Normocephalic, atraumatic, sclera non-icteric, no xanthomas, nares are without discharge. Dentition:  Neck: No carotid bruits. JVD not elevated.  Lungs: Respirations regular and unlabored, without wheezes or rales.  Heart: Regular rate and rhythm. No S3 or S4.  No murmur, no rubs, or gallops appreciated. Abdomen: Soft, non-tender, non-distended with normoactive bowel sounds. No hepatomegaly. No rebound/guarding. No obvious abdominal masses. Msk:  Strength and tone appear normal for age. No joint deformities or effusions. Extremities: No clubbing or cyanosis. No edema.  Distal pedal pulses are 2+ bilaterally. Neuro: Alert and oriented X 3. Moves all extremities spontaneously. Confused on exam Skin: No rashes or lesions noted, sternotomy well healed.  ASSESSMENT:  1. Post CABG for multivessel coronary disease, stable without evidence of ischemia 2. Postoperative atrial fibrillation, currently on amiodarone intravenously, converted to sinus rhythm. 3. Delirium, postop. 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 Continue IV amiodarone. Would switch to PO amiodarone when consistently taking PO. 2. IV beta blocker therapy to control rate as needed. 3. In sinus rhythm today.  No need for anticoagulation at this time.  If goes back into AF, would plan for anticoagulation at that time.  Signed, Lareta Bruneau Jorja Loa 01/30/2016, 12:41 PM

## 2016-01-30 NOTE — Progress Notes (Signed)
5 Days Post-Op Procedure(s) (LRB): CORONARY ARTERY BYPASS GRAFTING (CABG) times five using left internal mammary artery and right saphenous vein. (N/A) TRANSESOPHAGEAL ECHOCARDIOGRAM (TEE) Subjective: NSR delerium better with ativan PO2 remains low I con diuresis and Fortaz Off pressors Objective: Vital signs in last 24 hours: Temp:  [97.7 F (36.5 C)-100.5 F (38.1 C)] 98.2 F (36.8 C) (09/16 0905) Pulse Rate:  [60-127] 88 (09/16 0700) Cardiac Rhythm: Atrial paced (09/16 0400) Resp:  [18-39] 35 (09/16 0700) BP: (72-140)/(41-113) 119/76 (09/16 0700) SpO2:  [88 %-100 %] 100 % (09/16 0759) FiO2 (%):  [55 %] 55 % (09/15 1600) Weight:  [185 lb 13.6 oz (84.3 kg)] 185 lb 13.6 oz (84.3 kg) (09/16 0500)  Hemodynamic parameters for last 24 hours:  stable  Intake/Output from previous day: 09/15 0701 - 09/16 0700 In: 2119.4 [I.V.:742.4; Blood:277; IV Piggyback:1100] Out: 2030 [Urine:2030] Intake/Output this shift: No intake/output data recorded.  Lungs clear extem warm  Lab Results:  Recent Labs  01/29/16 1630 01/30/16 0411  WBC 11.9* 9.6  HGB 7.5* 8.0*  HCT 23.4* 25.2*  PLT 290 290   BMET:  Recent Labs  01/29/16 0518 01/29/16 1556 01/30/16 0411  NA 135 136 139  K 3.5 3.9 3.5  CL 97* 100* 102  CO2 26  --  23  GLUCOSE 122* 99 100*  BUN 27* 30* 34*  CREATININE 1.62* 1.70* 1.72*  CALCIUM 8.4*  --  8.1*    PT/INR: No results for input(s): LABPROT, INR in the last 72 hours. ABG    Component Value Date/Time   PHART 7.475 (H) 01/30/2016 0335   HCO3 23.7 01/30/2016 0335   TCO2 24 01/29/2016 1749   ACIDBASEDEF 4.0 (H) 01/25/2016 2000   O2SAT 85.6 01/30/2016 0335   CBG (last 3)   Recent Labs  01/29/16 2326 01/30/16 0334 01/30/16 0901  GLUCAP 87 101* 109*    Assessment/Plan: S/P Procedure(s) (LRB): CORONARY ARTERY BYPASS GRAFTING (CABG) times five using left internal mammary artery and right saphenous vein. (N/A) TRANSESOPHAGEAL ECHOCARDIOGRAM  (TEE) Mobilize Diuresis Diabetes control ativan prn delerium   LOS: 7 days    [name] Trigt III 01/30/2016

## 2016-01-30 NOTE — Progress Notes (Signed)
Increased patient to 15L high flow nasal cannula following ABG results with PaO2 of 52.

## 2016-01-30 NOTE — Progress Notes (Signed)
CT surgery p.m. Rounds  Postop delirium improved, patient much more calm Patient able to walk 100 feet in hallway More oriented and appropriate interaction with staff Continue amiodarone for postop A. Fib Currently A paced at 80/m for sinus bradycardia

## 2016-01-31 ENCOUNTER — Inpatient Hospital Stay (HOSPITAL_COMMUNITY): Payer: Medicare Other

## 2016-01-31 LAB — POCT I-STAT, CHEM 8
BUN: 34 mg/dL — AB (ref 6–20)
CALCIUM ION: 1.1 mmol/L — AB (ref 1.15–1.40)
CHLORIDE: 101 mmol/L (ref 101–111)
Creatinine, Ser: 1.5 mg/dL — ABNORMAL HIGH (ref 0.61–1.24)
Glucose, Bld: 103 mg/dL — ABNORMAL HIGH (ref 65–99)
HCT: 33 % — ABNORMAL LOW (ref 39.0–52.0)
Hemoglobin: 11.2 g/dL — ABNORMAL LOW (ref 13.0–17.0)
POTASSIUM: 3.7 mmol/L (ref 3.5–5.1)
SODIUM: 140 mmol/L (ref 135–145)
TCO2: 28 mmol/L (ref 0–100)

## 2016-01-31 LAB — CBC
HCT: 30.2 % — ABNORMAL LOW (ref 39.0–52.0)
Hemoglobin: 9.7 g/dL — ABNORMAL LOW (ref 13.0–17.0)
MCH: 28.4 pg (ref 26.0–34.0)
MCHC: 32.1 g/dL (ref 30.0–36.0)
MCV: 88.3 fL (ref 78.0–100.0)
Platelets: 321 10*3/uL (ref 150–400)
RBC: 3.42 MIL/uL — ABNORMAL LOW (ref 4.22–5.81)
RDW: 14.6 % (ref 11.5–15.5)
WBC: 12.2 10*3/uL — ABNORMAL HIGH (ref 4.0–10.5)

## 2016-01-31 LAB — COMPREHENSIVE METABOLIC PANEL
ALT: 61 U/L (ref 17–63)
AST: 62 U/L — ABNORMAL HIGH (ref 15–41)
Albumin: 2.6 g/dL — ABNORMAL LOW (ref 3.5–5.0)
Alkaline Phosphatase: 234 U/L — ABNORMAL HIGH (ref 38–126)
Anion gap: 12 (ref 5–15)
BUN: 33 mg/dL — ABNORMAL HIGH (ref 6–20)
CO2: 25 mmol/L (ref 22–32)
Calcium: 8.3 mg/dL — ABNORMAL LOW (ref 8.9–10.3)
Chloride: 102 mmol/L (ref 101–111)
Creatinine, Ser: 1.59 mg/dL — ABNORMAL HIGH (ref 0.61–1.24)
GFR calc Af Amer: 50 mL/min — ABNORMAL LOW (ref >60)
GFR calc non Af Amer: 43 mL/min — ABNORMAL LOW (ref >60)
Glucose, Bld: 101 mg/dL — ABNORMAL HIGH (ref 65–99)
Potassium: 3.9 mmol/L (ref 3.5–5.1)
Sodium: 139 mmol/L (ref 135–145)
Total Bilirubin: 1.6 mg/dL — ABNORMAL HIGH (ref 0.3–1.2)
Total Protein: 5.4 g/dL — ABNORMAL LOW (ref 6.5–8.1)

## 2016-01-31 LAB — TYPE AND SCREEN
ABO/RH(D): O POS
Antibody Screen: NEGATIVE
Unit division: 0
Unit division: 0

## 2016-01-31 LAB — GLUCOSE, CAPILLARY
GLUCOSE-CAPILLARY: 104 mg/dL — AB (ref 65–99)
Glucose-Capillary: 102 mg/dL — ABNORMAL HIGH (ref 65–99)
Glucose-Capillary: 107 mg/dL — ABNORMAL HIGH (ref 65–99)
Glucose-Capillary: 121 mg/dL — ABNORMAL HIGH (ref 65–99)
Glucose-Capillary: 102 mg/dL — ABNORMAL HIGH (ref 65–99)
Glucose-Capillary: 115 mg/dL — ABNORMAL HIGH (ref 65–99)

## 2016-01-31 LAB — SEDIMENTATION RATE: Sed Rate: 66 mm/hr — ABNORMAL HIGH (ref 0–16)

## 2016-01-31 MED ORDER — POTASSIUM CHLORIDE 10 MEQ/50ML IV SOLN
10.0000 meq | INTRAVENOUS | Status: AC
  Administered 2016-01-31 (×3): 10 meq via INTRAVENOUS
  Filled 2016-01-31 (×2): qty 50

## 2016-01-31 MED ORDER — POTASSIUM CHLORIDE CRYS ER 20 MEQ PO TBCR
20.0000 meq | EXTENDED_RELEASE_TABLET | Freq: Two times a day (BID) | ORAL | Status: DC
  Administered 2016-01-31 – 2016-02-01 (×2): 20 meq via ORAL
  Filled 2016-01-31 (×2): qty 1

## 2016-01-31 MED ORDER — FUROSEMIDE 10 MG/ML IJ SOLN
40.0000 mg | Freq: Every day | INTRAMUSCULAR | Status: DC
  Administered 2016-01-31 – 2016-02-01 (×2): 40 mg via INTRAVENOUS
  Filled 2016-01-31 (×2): qty 4

## 2016-01-31 NOTE — Progress Notes (Signed)
Patient Name: Darryl George Date of Encounter: 01/31/2016    SUBJECTIVE: Sister and brother long or at bedside. Remains in sinus rhythm. Much less confused today.  TELEMETRY:  A paced Vitals:   01/31/16 0600 01/31/16 0700 01/31/16 0813 01/31/16 0830  BP: 136/83 (!) 149/87    Pulse: 80 80    Resp: 19 (!) 29    Temp:   97.9 F (36.6 C)   TempSrc:   Axillary   SpO2: 100% 100%  100%  Weight:      Height:        Intake/Output Summary (Last 24 hours) at 01/31/16 1057 Last data filed at 01/31/16 0700  Gross per 24 hour  Intake           1058.3 ml  Output             1945 ml  Net           -886.7 ml   LABS: Basic Metabolic Panel:  Recent Labs  78/46/9609/16/17 0411 01/30/16 1844 01/31/16 0417  NA 139 135 139  K 3.5 3.7 3.9  CL 102 99* 102  CO2 23  --  25  GLUCOSE 100* 179* 101*  BUN 34* 28* 33*  CREATININE 1.72* 1.30* 1.59*  CALCIUM 8.1*  --  8.3*   CBC:  Recent Labs  01/30/16 1954 01/31/16 0417  WBC 11.1* 12.2*  HGB 8.4* 9.7*  HCT 25.9* 30.2*  MCV 87.2 88.3  PLT 330 321   Cardiac Enzymes: No results for input(s): CKTOTAL, CKMB, CKMBINDEX, TROPONINI in the last 72 hours. BNP: Invalid input(s): POCBNP Hemoglobin A1C: No results for input(s): HGBA1C in the last 72 hours. Fasting Lipid Panel: No results for input(s): CHOL, HDL, LDLCALC, TRIG, CHOLHDL, LDLDIRECT in the last 72 hours.  Radiology/Studies:  01/29/16 chest x-ray: IMPRESSION: 1. Cardiac enlargement and bilateral pleural effusions noted. Decreased aeration the left lung compared with previous exam.  Physical Exam: Blood pressure (!) 149/87, pulse 80, temperature 97.9 F (36.6 C), temperature source Axillary, resp. rate (!) 29, height 6\' 1"  (1.854 m), weight 174 lb 9.7 oz (79.2 kg), SpO2 100 %. Weight change: -11 lb 3.9 oz (-5.1 kg)  Wt Readings from Last 3 Encounters:  01/31/16 174 lb 9.7 oz (79.2 kg)  11/02/14 185 lb (83.9 kg)   General: Well developed, well nourished,male in no acute  distress. Confused Head: Normocephalic, atraumatic, sclera non-icteric, no xanthomas, nares are without discharge. Dentition:  Neck: No carotid bruits. JVD not elevated.  Lungs: Respirations regular and unlabored, without wheezes or rales.  Heart: Regular rate and rhythm. No S3 or S4.  No murmur, no rubs, or gallops appreciated. Abdomen: Soft, non-tender, non-distended with normoactive bowel sounds. No hepatomegaly. No rebound/guarding. No obvious abdominal masses. Msk:  Strength and tone appear normal for age. No joint deformities or effusions. Extremities: No clubbing or cyanosis. No edema.  Distal pedal pulses are 2+ bilaterally. Neuro: Alert and oriented X 3. Moves all extremities spontaneously. Confused on exam Skin: No rashes or lesions noted, sternotomy well healed.  ASSESSMENT:  1. Post CABG for multivessel coronary disease, stable without evidence of ischemia 2. Postoperative atrial fibrillation, currently on amiodarone intravenously, converted to sinus rhythm. 3. Delirium, postop. 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 Tolerating PO amiodarone without issues 2. IV beta blocker therapy to control rate as needed. 3. In sinus rhythm today. No need for anticoagulation at this time.  If goes back into AF, would plan for anticoagulation  at that time.  Cardiology to sign off.  Please to do not hesitate to all back if necessary.  Signed, [name] 01/31/2016, 10:57 AM

## 2016-01-31 NOTE — Progress Notes (Signed)
6 Days Post-Op Procedure(s) (LRB): CORONARY ARTERY BYPASS GRAFTING (CABG) times five using left internal mammary artery and right saphenous vein. (N/A) TRANSESOPHAGEAL ECHOCARDIOGRAM (TEE) Subjective: Mental status and pulmonary status continue improve Now on 4 L nasal cannula Chest x-ray with better aeration Responding well to diuretics Ambulated 200 feet in hallway  Objective: Vital signs in last 24 hours: Temp:  [97.1 F (36.2 C)-98.5 F (36.9 C)] 97.9 F (36.6 C) (09/17 0813) Pulse Rate:  [62-81] 80 (09/17 0700) Cardiac Rhythm: Atrial paced (09/17 0400) Resp:  [18-33] 29 (09/17 0700) BP: (108-155)/(67-87) 149/87 (09/17 0700) SpO2:  [95 %-100 %] 100 % (09/17 0830) Weight:  [174 lb 9.7 oz (79.2 kg)] 174 lb 9.7 oz (79.2 kg) (09/17 0400)  Hemodynamic parameters for last 24 hours:  sinus rhythm on oral amiodarone  Intake/Output from previous day: 09/16 0701 - 09/17 0700 In: 1308.4 [P.O.:300; I.V.:465.4; Blood:293; IV Piggyback:250] Out: 2245 [Urine:2245] Intake/Output this shift: No intake/output data recorded.       Exam    General- alert and comfortable   Lungs- clear without rales, wheezes   Cor- regular rate and rhythm, no murmur , gallop   Abdomen- soft, non-tender   Extremities - warm, non-tender, minimal edema   Neuro- oriented, appropriate, no focal weakness   Lab Results:  Recent Labs  01/30/16 1954 01/31/16 0417  WBC 11.1* 12.2*  HGB 8.4* 9.7*  HCT 25.9* 30.2*  PLT 330 321   BMET:  Recent Labs  01/30/16 0411 01/30/16 1844 01/31/16 0417  NA 139 135 139  K 3.5 3.7 3.9  CL 102 99* 102  CO2 23  --  25  GLUCOSE 100* 179* 101*  BUN 34* 28* 33*  CREATININE 1.72* 1.30* 1.59*  CALCIUM 8.1*  --  8.3*    PT/INR: No results for input(s): LABPROT, INR in the last 72 hours. ABG    Component Value Date/Time   PHART 7.475 (H) 01/30/2016 0335   HCO3 23.7 01/30/2016 0335   TCO2 22 01/30/2016 1844   ACIDBASEDEF 4.0 (H) 01/25/2016 2000   O2SAT 85.6  01/30/2016 0335   CBG (last 3)   Recent Labs  01/30/16 2343 01/31/16 0339 01/31/16 0804  GLUCAP 99 102* 107*    Assessment/Plan: S/P Procedure(s) (LRB): CORONARY ARTERY BYPASS GRAFTING (CABG) times five using left internal mammary artery and right saphenous vein. (N/A) TRANSESOPHAGEAL ECHOCARDIOGRAM (TEE) Mobilize Diuresis Diabetes control Continue IV Fortaz for possible pneumonia   LOS: 8 days    Darryl George 01/31/2016

## 2016-02-01 ENCOUNTER — Inpatient Hospital Stay (HOSPITAL_COMMUNITY): Payer: Medicare Other

## 2016-02-01 LAB — POCT I-STAT, CHEM 8
BUN: 33 mg/dL — ABNORMAL HIGH (ref 6–20)
CALCIUM ION: 1.11 mmol/L — AB (ref 1.15–1.40)
CHLORIDE: 100 mmol/L — AB (ref 101–111)
Creatinine, Ser: 1.5 mg/dL — ABNORMAL HIGH (ref 0.61–1.24)
GLUCOSE: 99 mg/dL (ref 65–99)
HCT: 29 % — ABNORMAL LOW (ref 39.0–52.0)
HEMOGLOBIN: 9.9 g/dL — AB (ref 13.0–17.0)
Potassium: 3.9 mmol/L (ref 3.5–5.1)
Sodium: 139 mmol/L (ref 135–145)
TCO2: 27 mmol/L (ref 0–100)

## 2016-02-01 LAB — GLUCOSE, CAPILLARY
GLUCOSE-CAPILLARY: 97 mg/dL (ref 65–99)
GLUCOSE-CAPILLARY: 107 mg/dL — AB (ref 65–99)
GLUCOSE-CAPILLARY: 128 mg/dL — AB (ref 65–99)
Glucose-Capillary: 94 mg/dL (ref 65–99)
Glucose-Capillary: 117 mg/dL — ABNORMAL HIGH (ref 65–99)

## 2016-02-01 LAB — CBC
HCT: 31.2 % — ABNORMAL LOW (ref 39.0–52.0)
Hemoglobin: 10 g/dL — ABNORMAL LOW (ref 13.0–17.0)
MCH: 28.4 pg (ref 26.0–34.0)
MCHC: 32.1 g/dL (ref 30.0–36.0)
MCV: 88.6 fL (ref 78.0–100.0)
Platelets: 399 10*3/uL (ref 150–400)
RBC: 3.52 MIL/uL — ABNORMAL LOW (ref 4.22–5.81)
RDW: 14.6 % (ref 11.5–15.5)
WBC: 13.8 10*3/uL — ABNORMAL HIGH (ref 4.0–10.5)

## 2016-02-01 LAB — COMPREHENSIVE METABOLIC PANEL
ALT: 58 U/L (ref 17–63)
AST: 49 U/L — ABNORMAL HIGH (ref 15–41)
Albumin: 2.6 g/dL — ABNORMAL LOW (ref 3.5–5.0)
Alkaline Phosphatase: 207 U/L — ABNORMAL HIGH (ref 38–126)
Anion gap: 10 (ref 5–15)
BUN: 35 mg/dL — ABNORMAL HIGH (ref 6–20)
CO2: 25 mmol/L (ref 22–32)
Calcium: 8.4 mg/dL — ABNORMAL LOW (ref 8.9–10.3)
Chloride: 105 mmol/L (ref 101–111)
Creatinine, Ser: 1.57 mg/dL — ABNORMAL HIGH (ref 0.61–1.24)
GFR calc Af Amer: 51 mL/min — ABNORMAL LOW (ref >60)
GFR calc non Af Amer: 44 mL/min — ABNORMAL LOW (ref >60)
Glucose, Bld: 99 mg/dL (ref 65–99)
Potassium: 4.1 mmol/L (ref 3.5–5.1)
Sodium: 140 mmol/L (ref 135–145)
Total Bilirubin: 1.3 mg/dL — ABNORMAL HIGH (ref 0.3–1.2)
Total Protein: 5.9 g/dL — ABNORMAL LOW (ref 6.5–8.1)

## 2016-02-01 MED ORDER — SODIUM CHLORIDE 0.9% FLUSH: 3.0000 mL | INTRAVENOUS | Status: DC | PRN

## 2016-02-01 MED ORDER — FUROSEMIDE 40 MG PO TABS
40.0000 mg | ORAL_TABLET | Freq: Every day | ORAL | Status: DC
  Administered 2016-02-01 – 2016-02-04 (×4): 40 mg via ORAL
  Filled 2016-02-01 (×4): qty 1

## 2016-02-01 MED ORDER — SODIUM CHLORIDE 0.9% FLUSH
3.0000 mL | Freq: Two times a day (BID) | INTRAVENOUS | Status: DC
  Administered 2016-02-01 (×2): 3 mL via INTRAVENOUS

## 2016-02-01 MED ORDER — SODIUM CHLORIDE 0.9 % IV SOLN: 250.0000 mL | INTRAVENOUS | Status: DC | PRN

## 2016-02-01 MED ORDER — MOVING RIGHT ALONG BOOK
Freq: Once | Status: AC
  Administered 2016-02-01: 15:00:00
  Filled 2016-02-01: qty 1

## 2016-02-01 MED ORDER — POTASSIUM CHLORIDE CRYS ER 20 MEQ PO TBCR
20.0000 meq | EXTENDED_RELEASE_TABLET | Freq: Every day | ORAL | Status: DC
  Administered 2016-02-02 – 2016-02-04 (×3): 20 meq via ORAL
  Filled 2016-02-01 (×3): qty 1

## 2016-02-01 NOTE — Care Management Important Message (Signed)
Important Message  Patient Details  Name: Darryl George MRN: [id number] Date of Birth: October 26, 1950   Medicare Important Message Given:  Yes    Kyla Balzarine 02/01/2016, 9:43 AM

## 2016-02-01 NOTE — Progress Notes (Signed)
7 Days Post-Op Procedure(s) (LRB): CORONARY ARTERY BYPASS GRAFTING (CABG) times five using left internal mammary artery and right saphenous vein. (N/A) TRANSESOPHAGEAL ECHOCARDIOGRAM (TEE) Subjective: progresing now after improvement in delerium,acute respiratory failure, acute renal failure, afib 4l [place] walked in hall Wil;l transfer to stepdown Cont Fortaz for HCAP Objective: Vital signs in last 24 hours: Temp:  [97.1 F (36.2 C)-98.2 F (36.8 C)] 97.6 F (36.4 C) (09/18 1100) Pulse Rate:  [34-131] 42 (09/18 1300) Cardiac Rhythm: Normal sinus rhythm (09/18 1200) Resp:  [17-32] 24 (09/18 1300) BP: (112-155)/(64-92) 121/69 (09/18 1300) SpO2:  [83 %-100 %] 92 % (09/18 1315) Weight:  [170 lb 3.1 oz (77.2 kg)] 170 lb 3.1 oz (77.2 kg) (09/18 0500)  Hemodynamic parameters for last 24 hours:  stable  Intake/Output from previous day: 09/17 0701 - 09/18 0700 In: 980 [P.O.:720; I.V.:110; IV Piggyback:150] Out: 1120 [Urine:1120] Intake/Output this shift: Total I/O In: 53 [I.V.:3; IV Piggyback:50] Out: 800 [Urine:800]       Exam    General- alert and comfortable   Lungs- clear without rales, wheezes   Cor- regular rate and rhythm, no murmur , gallop   Abdomen- soft, non-tender   Extremities - warm, non-tender, minimal edema   Neuro- oriented, appropriate, no focal weakness   Lab Results:  Recent Labs  01/31/16 0417 01/31/16 1750 02/01/16 0340  WBC 12.2*  --  13.8*  HGB 9.7* 11.2* 10.0*  HCT 30.2* 33.0* 31.2*  PLT 321  --  399   BMET:  Recent Labs  01/31/16 0417 01/31/16 1750 02/01/16 0340  NA 139 140 140  K 3.9 3.7 4.1  CL 102 101 105  CO2 25  --  25  GLUCOSE 101* 103* 99  BUN 33* 34* 35*  CREATININE 1.59* 1.50* 1.57*  CALCIUM 8.3*  --  8.4*    PT/INR: No results for input(s): LABPROT, INR in the last 72 hours. ABG    Component Value Date/Time   PHART 7.475 (H) 01/30/2016 0335   HCO3 23.7 01/30/2016 0335   TCO2 28 01/31/2016 1750   ACIDBASEDEF 4.0 (H)  01/25/2016 2000   O2SAT 85.6 01/30/2016 0335   CBG (last 3)   Recent Labs  02/01/16 0353 02/01/16 0834 02/01/16 1207  GLUCAP 97 94 117*    Assessment/Plan: S/P Procedure(s) (LRB): CORONARY ARTERY BYPASS GRAFTING (CABG) times five using left internal mammary artery and right saphenous vein. (N/A) TRANSESOPHAGEAL ECHOCARDIOGRAM (TEE) Mobilize Diuresis Plan for transfer to step-down: see transfer orders   LOS: 9 days    [name] Trigt III 02/01/2016

## 2016-02-01 NOTE — Progress Notes (Signed)
      [address]       [place] [address]             [phone]      Up in chair talking on telephone  BP 118/71   Pulse 64   Temp 97.5 F (36.4 C) (Oral)   Resp 19   Ht 6\' 1"  (1.854 m)   Wt 170 lb 3.1 oz (77.2 kg)   SpO2 94%   BMI 22.45 kg/m    Intake/Output Summary (Last 24 hours) at 02/01/16 1845 Last data filed at 02/01/16 1642  Gross per 24 hour  Intake              876 ml  Output             1200 ml  Net             -324 ml    Awaiting step down bed  Viviann Spare C. Dorris Fetch, MD [facility] [phone]

## 2016-02-02 ENCOUNTER — Inpatient Hospital Stay (HOSPITAL_COMMUNITY): Payer: Medicare Other

## 2016-02-02 LAB — BASIC METABOLIC PANEL
Anion gap: 8 (ref 5–15)
BUN: 31 mg/dL — ABNORMAL HIGH (ref 6–20)
CO2: 28 mmol/L (ref 22–32)
Calcium: 8.5 mg/dL — ABNORMAL LOW (ref 8.9–10.3)
Chloride: 103 mmol/L (ref 101–111)
Creatinine, Ser: 1.43 mg/dL — ABNORMAL HIGH (ref 0.61–1.24)
GFR calc Af Amer: 57 mL/min — ABNORMAL LOW (ref >60)
GFR calc non Af Amer: 49 mL/min — ABNORMAL LOW (ref >60)
Glucose, Bld: 114 mg/dL — ABNORMAL HIGH (ref 65–99)
Potassium: 3.8 mmol/L (ref 3.5–5.1)
Sodium: 139 mmol/L (ref 135–145)

## 2016-02-02 LAB — CBC
HCT: 30.6 % — ABNORMAL LOW (ref 39.0–52.0)
Hemoglobin: 9.7 g/dL — ABNORMAL LOW (ref 13.0–17.0)
MCH: 28.4 pg (ref 26.0–34.0)
MCHC: 31.7 g/dL (ref 30.0–36.0)
MCV: 89.5 fL (ref 78.0–100.0)
Platelets: 429 10*3/uL — ABNORMAL HIGH (ref 150–400)
RBC: 3.42 MIL/uL — ABNORMAL LOW (ref 4.22–5.81)
RDW: 14.5 % (ref 11.5–15.5)
WBC: 12.2 10*3/uL — ABNORMAL HIGH (ref 4.0–10.5)

## 2016-02-02 MED ORDER — LEVALBUTEROL HCL 1.25 MG/0.5ML IN NEBU
1.2500 mg | INHALATION_SOLUTION | Freq: Four times a day (QID) | RESPIRATORY_TRACT | Status: DC
  Administered 2016-02-03 – 2016-02-04 (×6): 1.25 mg via RESPIRATORY_TRACT
  Filled 2016-02-02 (×6): qty 0.5

## 2016-02-02 MED ORDER — CEFTAZIDIME 2 G IJ SOLR
2.0000 g | Freq: Three times a day (TID) | INTRAMUSCULAR | Status: DC
  Administered 2016-02-02 – 2016-02-03 (×3): 2 g via INTRAVENOUS
  Filled 2016-02-02 (×5): qty 2

## 2016-02-02 MED ORDER — ENOXAPARIN SODIUM 40 MG/0.4ML ~~LOC~~ SOLN
40.0000 mg | SUBCUTANEOUS | Status: DC
  Administered 2016-02-02 – 2016-02-04 (×3): 40 mg via SUBCUTANEOUS
  Filled 2016-02-02 (×3): qty 0.4

## 2016-02-02 NOTE — Progress Notes (Signed)
8 Days Post-Op Procedure(s) (LRB): CORONARY ARTERY BYPASS GRAFTING (CABG) times five using left internal mammary artery and right saphenous vein. (N/A) TRANSESOPHAGEAL ECHOCARDIOGRAM (TEE) Subjective: Now on 2 L Postop HCAP on Fortaz tx to 2 west Objective: Vital signs in last 24 hours: Temp:  [97.5 F (36.4 C)-98 F (36.7 C)] 97.7 F (36.5 C) (09/18 1953) Pulse Rate:  [42-73] 63 (09/19 0700) Cardiac Rhythm: Normal sinus rhythm (09/19 0400) Resp:  [15-27] 24 (09/19 0700) BP: (107-136)/(61-76) 129/75 (09/19 0700) SpO2:  [89 %-97 %] 92 % (09/19 0736) FiO2 (%):  [95 %] 95 % (09/18 2002) Weight:  [170 lb 3.1 oz (77.2 kg)] 170 lb 3.1 oz (77.2 kg) (09/19 0500)  Hemodynamic parameters for last 24 hours:   stable Intake/Output from previous day: 09/18 0701 - 09/19 0700 In: 478.7 [P.O.:300; I.V.:78.7; IV Piggyback:100] Out: 1475 [Urine:1475] Intake/Output this shift: No intake/output data recorded.       Exam    General- alert and comfortable   Lungs- clear without rales, wheezes   Cor- regular rate and rhythm, no murmur , gallop   Abdomen- soft, non-tender   Extremities - warm, non-tender, minimal edema   Neuro- oriented, appropriate, no focal weakness   Lab Results:  Recent Labs  02/01/16 0340 02/01/16 1648 02/02/16 0400  WBC 13.8*  --  12.2*  HGB 10.0* 9.9* 9.7*  HCT 31.2* 29.0* 30.6*  PLT 399  --  429*   BMET:  Recent Labs  02/01/16 0340 02/01/16 1648 02/02/16 0400  NA 140 139 139  K 4.1 3.9 3.8  CL 105 100* 103  CO2 25  --  28  GLUCOSE 99 99 114*  BUN 35* 33* 31*  CREATININE 1.57* 1.50* 1.43*  CALCIUM 8.4*  --  8.5*    PT/INR: No results for input(s): LABPROT, INR in the last 72 hours. ABG    Component Value Date/Time   PHART 7.475 (H) 01/30/2016 0335   HCO3 23.7 01/30/2016 0335   TCO2 27 02/01/2016 1648   ACIDBASEDEF 4.0 (H) 01/25/2016 2000   O2SAT 85.6 01/30/2016 0335   CBG (last 3)   Recent Labs  02/01/16 1207 02/01/16 1631  02/01/16 1951  GLUCAP 117* 107* 128*    Assessment/Plan: S/P Procedure(s) (LRB): CORONARY ARTERY BYPASS GRAFTING (CABG) times five using left internal mammary artery and right saphenous vein. (N/A) TRANSESOPHAGEAL ECHOCARDIOGRAM (TEE) Mobilize transfer telemetry Cont Fortaz  LOS: 10 days    Darryl George 02/02/2016

## 2016-02-02 NOTE — Progress Notes (Signed)
CARDIAC REHAB PHASE I   PRE:  Rate/Rhythm: 64 SR  BP:  Supine:   Sitting: 115/70  Standing:    SaO2: 94% 1L  MODE:  Ambulation: 350 ft   POST:  Rate/Rhythm: 75 SR  BP:  Supine:   Sitting: 118/65  Standing:    SaO2: 90%RA put on 1L to sleep 1420-1449 Pt walked 350 ft on RA with gait belt use, rolling walker and asst x 1.  Needs encouragement to adhere to sternal precautions. Very motivated to walk. To bed after walk. Put on 1L since going to sleep.   Luetta Nutting, RN BSN  02/02/2016 2:45 PM

## 2016-02-02 NOTE — Progress Notes (Signed)
Patient transferred to room 2W24. Report given to nurse and all questions answered. Belongings transferred with patient. VSS during transfer. Family has been updated on location.   Noralyn Pick, RN

## 2016-02-03 LAB — BASIC METABOLIC PANEL
Anion gap: 9 (ref 5–15)
BUN: 28 mg/dL — ABNORMAL HIGH (ref 6–20)
CO2: 24 mmol/L (ref 22–32)
Calcium: 8.8 mg/dL — ABNORMAL LOW (ref 8.9–10.3)
Chloride: 102 mmol/L (ref 101–111)
Creatinine, Ser: 1.46 mg/dL — ABNORMAL HIGH (ref 0.61–1.24)
GFR calc Af Amer: 56 mL/min — ABNORMAL LOW (ref >60)
GFR calc non Af Amer: 48 mL/min — ABNORMAL LOW (ref >60)
Glucose, Bld: 88 mg/dL (ref 65–99)
Potassium: 4.2 mmol/L (ref 3.5–5.1)
Sodium: 135 mmol/L (ref 135–145)

## 2016-02-03 LAB — CBC
HCT: 32.5 % — ABNORMAL LOW (ref 39.0–52.0)
Hemoglobin: 10.1 g/dL — ABNORMAL LOW (ref 13.0–17.0)
MCH: 28 pg (ref 26.0–34.0)
MCHC: 31.1 g/dL (ref 30.0–36.0)
MCV: 90 fL (ref 78.0–100.0)
Platelets: 541 10*3/uL — ABNORMAL HIGH (ref 150–400)
RBC: 3.61 MIL/uL — ABNORMAL LOW (ref 4.22–5.81)
RDW: 14.5 % (ref 11.5–15.5)
WBC: 15.4 10*3/uL — ABNORMAL HIGH (ref 4.0–10.5)

## 2016-02-03 LAB — GLUCOSE, CAPILLARY: GLUCOSE-CAPILLARY: 87 mg/dL (ref 65–99)

## 2016-02-03 MED ORDER — AZITHROMYCIN 500 MG PO TABS
500.0000 mg | ORAL_TABLET | ORAL | Status: DC
Start: 2016-02-03 — End: 2016-02-04
  Administered 2016-02-03: 500 mg via ORAL
  Filled 2016-02-03 (×3): qty 1

## 2016-02-03 NOTE — Progress Notes (Signed)
Epicardial pacing wires have been removed per MD order. Ends were intact upon removal. Pt tolerated removal well. Scant drainage was noted at the site of the right epicardial wire. Gauze dressings were applied to both exit sites. Vitals are stable and are being monitored every 15 minutes per protocol. Pt is resting in bed with call light within reach. Pt is on bedrest until 12:30, per protocol. Pt is aware of this. Pt has no complaints at this time. Will continue to monitor.   Berdine Dance BSN, RN

## 2016-02-03 NOTE — Care Management Important Message (Signed)
Important Message  Patient Details  Name: Darryl George MRN: [id number] Date of Birth: [birth year]   Medicare Important Message Given:  Yes    Kyla Balzarine 02/03/2016, 9:28 AM

## 2016-02-03 NOTE — Progress Notes (Addendum)
      [address]       [place] [address]             [phone]      9 Days Post-Op Procedure(s) (LRB): CORONARY ARTERY BYPASS GRAFTING (CABG) times five using left internal mammary artery and right saphenous vein. (N/A) TRANSESOPHAGEAL ECHOCARDIOGRAM (TEE) Subjective: Feels good this morning. Asking to go home.  Objective: Vital signs in last 24 hours: Temp:  [98 F (36.7 C)-98.2 F (36.8 C)] 98 F (36.7 C) (09/20 0458) Pulse Rate:  [60-72] 70 (09/20 0458) Cardiac Rhythm: Normal sinus rhythm (09/20 0700) Resp:  [17-18] 18 (09/20 0458) BP: (113-133)/(63-74) 127/68 (09/20 0458) SpO2:  [92 %-99 %] 97 % (09/20 0800) Weight:  [169 lb 6.4 oz (76.8 kg)] 169 lb 6.4 oz (76.8 kg) (09/20 0458)     Intake/Output from previous day: 09/19 0701 - 09/20 0700 In: 575 [P.O.:505; I.V.:20; IV Piggyback:50] Out: 500 [Urine:500] Intake/Output this shift: Total I/O In: 240 [P.O.:240] Out: -   General appearance: alert, cooperative and no distress Heart: regular rate and rhythm, S1, S2 normal, no murmur, click, rub or gallop Lungs: clear to auscultation bilaterally Abdomen: soft, non-tender; bowel sounds normal; no masses,  no organomegaly Extremities: extremities normal, atraumatic, no cyanosis or edema Wound: c/d/i without drainage   Lab Results:  Recent Labs  02/02/16 0400 02/03/16 0441  WBC 12.2* 15.4*  HGB 9.7* 10.1*  HCT 30.6* 32.5*  PLT 429* 541*   BMET:  Recent Labs  02/02/16 0400 02/03/16 0441  NA 139 135  K 3.8 4.2  CL 103 102  CO2 28 24  GLUCOSE 114* 88  BUN 31* 28*  CREATININE 1.43* 1.46*  CALCIUM 8.5* 8.8*    PT/INR: No results for input(s): LABPROT, INR in the last 72 hours. ABG    Component Value Date/Time   PHART 7.475 (H) 01/30/2016 0335   HCO3 23.7 01/30/2016 0335   TCO2 27 02/01/2016 1648   ACIDBASEDEF 4.0 (H) 01/25/2016 2000   O2SAT 85.6 01/30/2016 0335   CBG (last 3)   Recent Labs  02/01/16 1207 02/01/16 1631  02/01/16 1951  GLUCAP 117* 107* 128*    Assessment/Plan: S/P Procedure(s) (LRB): CORONARY ARTERY BYPASS GRAFTING (CABG) times five using left internal mammary artery and right saphenous vein. (N/A) TRANSESOPHAGEAL ECHOCARDIOGRAM (TEE)  1. CV- NSR in the 60s. BP good on current regimen 2. Pulm- 2L oxygen. Wean as tolerated. Encouraged Is/flutter valve use 3. Renal- creatinine stable. Continue Lasix. CXR yesterday showed stable small pleural effusions 4. Endo-well controlled 5. Continue IV antibiotics. Will try and change to Po.  Plan: Wean oxygen. D/c pacing wires. Mobilize. Plan to discharge to facility, working with case management and social work.  LOS: 11 days    Darryl George 02/03/2016  Agree with above assessment and plan Change to oral antibiotics for bronchopneumonia [name] trigt M.D.

## 2016-02-03 NOTE — Care Management Note (Signed)
Case Management Note Darryl Pierini RN, BSN Unit 2W-Case Manager [phone]  Patient Details  Name: Darryl George MRN: [id number] Date of Birth: October 23, 1950  Subjective/Objective:  Pt admitted with ACS- s/p CABG on 9/11- tx from ICU to 2W on 02/02/16                  Action/Plan: PTA pt lived at home with SO- independent- has sister who lives in [place] who is working on pulling things together for 24/7 assistance at discharge- plan is to return home- per conversation with pt on 9/20- he states that he is a [place] pt at the [facility]- he gets his medications via mail order through the [place]- PCP is Darryl George at the [place] there in [place]- pt request that the [place] be notified of his admission- call made to the [place] in [place] and spoke with Darryl George- per Darryl George pt will need to have any new prescriptions approved by this PCP before they can be sent out via mail order- if pt needs them immediately then he can go to the ED pharmacy there at the [facility] [place] to have them filled. Call also made to the [facility] to notify of pt's admission. CM to continue to follow for d/c needs   Expected Discharge Date:                  Expected Discharge Plan:  Home/Self Care  In-House Referral:     Discharge planning Services  CM Consult  Post Acute Care Choice:    Choice offered to:     DME Arranged:    DME Agency:     HH Arranged:    HH Agency:     Status of Service:  In process, will continue to follow  If discussed at Long Length of Stay Meetings, dates discussed:    Additional Comments:  Darrold Span, RN 02/03/2016, 11:31 AM

## 2016-02-03 NOTE — Progress Notes (Signed)
CARDIAC REHAB PHASE I   PRE:  Rate/Rhythm: 72 SR    BP: sitting 124/66    SaO2: 87-91 RA sleeping  MODE:  Ambulation: 550 ft   POST:  Rate/Rhythm: 86 SR    BP: sitting 119/69     SaO2: 89-91 RA  Pt asleep, SaO2 ok. Able to walk with RW, fairly steady although he did get outside of RW and slight LOB on return to room/getting to recliner. Discussed correct way of using RW. Some SOB, SaO2 89-91 RA. Encouraged more IS/flutter. SaO2 at rest in hall 96 RA on left finger (right finger low 80s, presumed inaccurate). 0051-1021   Harriet Masson CES, ACSM 02/03/2016 2:42 PM

## 2016-02-03 NOTE — Progress Notes (Signed)
Pt walked 550 ft around unit. No distress noted or voiced. Pt did well walking and talking with 1 L O2 on. He's now resting comfortably in bed.

## 2016-02-03 NOTE — Clinical Social Work Note (Signed)
Patient is walking 531ft. Patient is not appropriate for SNF placement. RNCM notified. CSW signing off.   [employer], LCSW [phone]

## 2016-02-04 ENCOUNTER — Inpatient Hospital Stay
Admission: RE | Admit: 2016-02-04 | Discharge: 2016-02-10 | Disposition: A | Discharge status: Home / Self Care | Payer: Non-veteran care | Source: Ambulatory Visit | Attending: Internal Medicine | Admitting: Internal Medicine

## 2016-02-04 ENCOUNTER — Telehealth: Payer: Self-pay | Admitting: Physician Assistant

## 2016-02-04 DIAGNOSIS — I10 Essential (primary) hypertension: Secondary | ICD-10-CM | POA: Diagnosis not present

## 2016-02-04 DIAGNOSIS — Z720 Tobacco use: Secondary | ICD-10-CM | POA: Diagnosis not present

## 2016-02-04 DIAGNOSIS — J449 Chronic obstructive pulmonary disease, unspecified: Secondary | ICD-10-CM | POA: Diagnosis not present

## 2016-02-04 DIAGNOSIS — Z48812 Encounter for surgical aftercare following surgery on the circulatory system: Secondary | ICD-10-CM | POA: Diagnosis not present

## 2016-02-04 DIAGNOSIS — I2510 Atherosclerotic heart disease of native coronary artery without angina pectoris: Secondary | ICD-10-CM | POA: Diagnosis not present

## 2016-02-04 DIAGNOSIS — I480 Paroxysmal atrial fibrillation: Secondary | ICD-10-CM | POA: Diagnosis not present

## 2016-02-04 DIAGNOSIS — I213 ST elevation (STEMI) myocardial infarction of unspecified site: Secondary | ICD-10-CM | POA: Diagnosis not present

## 2016-02-04 DIAGNOSIS — I249 Acute ischemic heart disease, unspecified: Secondary | ICD-10-CM | POA: Diagnosis not present

## 2016-02-04 DIAGNOSIS — J410 Simple chronic bronchitis: Secondary | ICD-10-CM | POA: Diagnosis not present

## 2016-02-04 DIAGNOSIS — Z951 Presence of aortocoronary bypass graft: Secondary | ICD-10-CM | POA: Diagnosis not present

## 2016-02-04 DIAGNOSIS — M6281 Muscle weakness (generalized): Secondary | ICD-10-CM | POA: Diagnosis not present

## 2016-02-04 MED ORDER — THIAMINE HCL 100 MG PO TABS: 100.0000 mg | ORAL_TABLET | Freq: Every day | ORAL | 1 refills | Status: DC

## 2016-02-04 MED ORDER — POTASSIUM CHLORIDE CRYS ER 20 MEQ PO TBCR: 20.0000 meq | EXTENDED_RELEASE_TABLET | Freq: Every day | ORAL | 0 refills | Status: DC

## 2016-02-04 MED ORDER — ATORVASTATIN CALCIUM 80 MG PO TABS: 80.0000 mg | ORAL_TABLET | Freq: Every day | ORAL | 1 refills | Status: DC

## 2016-02-04 MED ORDER — MOMETASONE FURO-FORMOTEROL FUM 200-5 MCG/ACT IN AERO: 2.0000 | INHALATION_SPRAY | Freq: Two times a day (BID) | RESPIRATORY_TRACT | 12 refills | Status: DC

## 2016-02-04 MED ORDER — TRAMADOL HCL 50 MG PO TABS: 50.0000 mg | ORAL_TABLET | ORAL | 0 refills | Status: DC | PRN

## 2016-02-04 MED ORDER — LEVALBUTEROL HCL 1.25 MG/0.5ML IN NEBU: 1.2500 mg | INHALATION_SOLUTION | Freq: Four times a day (QID) | RESPIRATORY_TRACT | 12 refills | Status: DC | PRN

## 2016-02-04 MED ORDER — ASPIRIN 325 MG PO TBEC: 325.0000 mg | DELAYED_RELEASE_TABLET | Freq: Every day | ORAL | 0 refills | Status: DC

## 2016-02-04 MED ORDER — VITAMIN B-1 100 MG PO TABS
100.0000 mg | ORAL_TABLET | Freq: Every day | ORAL | Status: DC
  Administered 2016-02-04: 100 mg via ORAL
  Filled 2016-02-04: qty 1

## 2016-02-04 MED ORDER — AMIODARONE HCL 200 MG PO TABS: 200.0000 mg | ORAL_TABLET | Freq: Two times a day (BID) | ORAL | 1 refills | Status: DC

## 2016-02-04 MED ORDER — METOPROLOL TARTRATE 25 MG PO TABS
12.5000 mg | ORAL_TABLET | Freq: Two times a day (BID) | ORAL | 1 refills | Status: DC
Start: 2016-02-04 — End: 2016-02-11

## 2016-02-04 MED ORDER — FUROSEMIDE 40 MG PO TABS: 40.0000 mg | ORAL_TABLET | Freq: Every day | ORAL | 0 refills | Status: DC

## 2016-02-04 NOTE — Clinical Social Work Placement (Signed)
   CLINICAL SOCIAL WORK PLACEMENT  NOTE  Date:  02/04/2016  Patient Details  Name: Darryl George MRN: [id number] Date of Birth: [birth year]  Clinical Social Work is seeking post-discharge placement for this patient at the Skilled  Nursing Facility level of care (*CSW will initial, date and re-position this form in  chart as items are completed):  Yes   Patient/family provided with [facility] Clinical Social Work Department's list of facilities offering this level of care within the geographic area requested by the patient (or if unable, by the patient's family).  Yes   Patient/family informed of their freedom to choose among providers that offer the needed level of care, that participate in Medicare, Medicaid or managed care program needed by the patient, have an available bed and are willing to accept the patient.  Yes   Patient/family informed of [facility]'s ownership interest in [facility] and [facility], as well as of the fact that they are under no obligation to receive care at these facilities.  PASRR submitted to EDS on 02/04/16     PASRR number received on 02/04/16     Existing PASRR number confirmed on       FL2 transmitted to all facilities in geographic area requested by pt/family on 02/04/16     FL2 transmitted to all facilities within larger geographic area on       Patient informed that his/her managed care company has contracts with or will negotiate with certain facilities, including the following:        Yes   Patient/family informed of bed offers received.  Patient chooses bed at [facility]     Physician recommends and patient chooses bed at      Patient to be transferred to [facility] on 02/04/16.  Patient to be transferred to facility by private vehicle     Patient family notified on 02/04/16 of transfer.  Name of family member notified:  girlfriend     PHYSICIAN Please sign FL2     Additional Comment:     _______________________________________________ Burna Sis, LCSW 02/04/2016, 1:51 PM

## 2016-02-04 NOTE — Progress Notes (Signed)
Instructed pt on procedure. Pt HOB less than 45 degrees. Pt held breath with line removal. Pressure held and pressure then applied. No s/sx of bleeding noted. Instructed to remain in bed for 30 min after line pulled. Instructed to notify nurse with any s/sx of bleeding, and to leave drsg CDI for 24 hours. CG and PT VU. Tomasita Morrow, RN VAST

## 2016-02-04 NOTE — Telephone Encounter (Addendum)
Received msg result in Trish Trent's basket from TCTS PA to schedule OP f/u. Qualifies for [facility] visit given STEMI. Appt made with Nada Boozer NP, placed in follow up section chart. Also sent msg to triage to notify them of TOC status. I notified nurse to let her know AVS has been updated so patient is made aware of appt before he leaves. (appt populates on AVS) Dayna Dunn PA-C

## 2016-02-04 NOTE — Progress Notes (Signed)
CARDIAC REHAB PHASE I   PRE:  Rate/Rhythm: 81SR  BP:  Supine: 136/80  Sitting:   Standing:    SaO2: 91%RA  MODE:  Ambulation: 690 ft   POST:  Rate/Rhythm: 97 SR  BP:  Supine:   Sitting: 130/70  Standing:    SaO2: 89-91%RA 1005-1047 Pt walked 690 ft on RA with sats monitored. Talked during walk. No c/o SOB. Pt stated his room mated's grandson having surgery today for appendicitis so not here at this time. Completed ed with pt and encouraged him to have her read materials. Encouraged smoking cessation . Pt refused fake cigarette. Stated he may use nicotine patches. Discussed reasoning for that smoking. Unsure if pt is going to stay quit as he stated he will try but not sure. Gave heart healthy diet and discussed healthy food choices. Gave ex ed for hand held asst. Pt did not need walker, was steady with hand held asst. Discussed CRP 2 and will refer to [facility]. Put on discharge video for re enforcement. Discussed IS and flutter valve. Had pt demonstrate IS correctly.   Luetta Nutting, RN BSN  02/04/2016 10:42 AM

## 2016-02-04 NOTE — Progress Notes (Signed)
PT Cancellation Note  Patient Details Name: Darryl George MRN: [id number] DOB: 1951-04-11   Cancelled Treatment:    Reason Eval/Treat Not Completed: PT screened, no needs identified, will sign off (pt being seen by cardiac rehab, D/C plan already confirmed for SNF and at this time no further acute needs. Please reorder should pt status change)   Toney Sang [facility] 02/04/2016, 1:14 PM Delaney Meigs, PT [phone]

## 2016-02-04 NOTE — Progress Notes (Signed)
Patient will discharge to [facility] Anticipated discharge date: 9/21 Family notified: girlfriend at bedside Transportation by pt girlfriend Report #: [id number]  CSW signing off.  Burna Sis, LCSWA Clinical Social Worker [phone]

## 2016-02-04 NOTE — NC FL2 (Signed)
[place] MEDICAID FL2 LEVEL OF CARE SCREENING TOOL     IDENTIFICATION  Patient Name: Darryl George Birthdate: November 16, 1950 Sex: male Admission Date (Current Location): 01/23/2016  [facility] and [place] Number:  Producer, television/film/video and Address:  [facility] [place], [address] [place], [place] [address]      Provider Number: [id number]  Attending Physician Name and Address:  Kerin Perna, MD  Relative Name and Phone Number:       Current Level of Care: Hospital Recommended Level of Care: Skilled Nursing Facility Prior Approval Number:    Date Approved/Denied:   PASRR Number:   [id number]   Discharge Plan: SNF    Current Diagnoses: Patient Active Problem List   Diagnosis Date Noted  . Paroxysmal atrial fibrillation (HCC) 01/28/2016  . Essential hypertension   . COPD (chronic obstructive pulmonary disease) (HCC)   . Stroke (HCC)   . Tobacco abuse   . Coronary artery disease involving native coronary artery of native heart without angina pectoris 01/23/2016  . ACS (acute coronary syndrome) (HCC) 01/23/2016  . COPD bronchitis 01/23/2016  . Chronic anticoagulation 01/23/2016    Orientation RESPIRATION BLADDER Height & Weight     Self, Time, Situation, Place  Normal Continent Weight: 168 lb 3.2 oz (76.3 kg) Height:  6\' 1"  (185.4 cm)  BEHAVIORAL SYMPTOMS/MOOD NEUROLOGICAL BOWEL NUTRITION STATUS      Continent Diet (cardiac; carb modified)  AMBULATORY STATUS COMMUNICATION OF NEEDS Skin   Supervision Verbally Surgical wounds                       Personal Care Assistance Level of Assistance  Bathing, Dressing Bathing Assistance: Limited assistance   Dressing Assistance: Limited assistance     Functional Limitations Info             SPECIAL CARE FACTORS FREQUENCY  PT (By licensed PT), OT (By licensed OT)     PT Frequency: 5/wk OT Frequency: 5/wk            Contractures      Additional Factors Info  Code Status,  Allergies Code Status Info: FULL Allergies Info: NKA           Current Medications (02/04/2016):  This is the current hospital active medication list Current Facility-Administered Medications  Medication Dose Route Frequency Provider Last Rate Last Dose  . 0.45 % sodium chloride infusion   Intravenous Continuous PRN Sharlene Dory, PA-C 20 mL/hr at 01/26/16 0900    . 0.9 %  sodium chloride infusion  250 mL Intravenous Continuous Tessa N Conte, PA-C      . 0.9 %  sodium chloride infusion   Intravenous Continuous Tessa N Conte, PA-C      . 0.9 %  sodium chloride infusion  250 mL Intravenous PRN Kerin Perna, MD   Stopped at 02/02/16 0300  . amiodarone (PACERONE) tablet 200 mg  200 mg Oral BID Kerin Perna, MD   200 mg at 02/03/16 2305  . aspirin EC tablet 325 mg  325 mg Oral Daily Sharlene Dory, PA-C   325 mg at 02/03/16 1048   Or  . aspirin chewable tablet 324 mg  324 mg Per Tube Daily Sharlene Dory, PA-C      . atorvastatin (LIPITOR) tablet 80 mg  80 mg Oral q1800 Sharlene Dory, PA-C   80 mg at 02/03/16 1806  . azithromycin (ZITHROMAX) tablet 500 mg  500 mg  Oral Q24H Kerin PernaPeter Van Trigt, MD   500 mg at 02/03/16 2304  . bisacodyl (DULCOLAX) EC tablet 10 mg  10 mg Oral Daily Sharlene Doryessa N Conte, PA-C   10 mg at 01/31/16 0930   Or  . bisacodyl (DULCOLAX) suppository 10 mg  10 mg Rectal Daily Sharlene Doryessa N Conte, PA-C      . docusate sodium (COLACE) capsule 200 mg  200 mg Oral Daily Sharlene Doryessa N Conte, PA-C   200 mg at 02/02/16 0944  . enoxaparin (LOVENOX) injection 40 mg  40 mg Subcutaneous Q24H Kerin PernaPeter Van Trigt, MD   40 mg at 02/03/16 1118  . furosemide (LASIX) tablet 40 mg  40 mg Oral Daily Kerin PernaPeter Van Trigt, MD   40 mg at 02/03/16 1048  . levalbuterol (XOPENEX) nebulizer solution 1.25 mg  1.25 mg Nebulization Q6H PRN Kerin PernaPeter Van Trigt, MD   1.25 mg at 01/27/16 2217  . levalbuterol (XOPENEX) nebulizer solution 1.25 mg  1.25 mg Nebulization QID Kerin PernaPeter Van Trigt, MD   1.25 mg at 02/04/16 0835  . LORazepam (ATIVAN)  injection 0.5 mg  0.5 mg Intravenous Q6H PRN Kerin PernaPeter Van Trigt, MD      . MEDLINE mouth rinse  15 mL Mouth Rinse BID Kerin PernaPeter Van Trigt, MD   15 mL at 02/03/16 1051  . metoprolol (LOPRESSOR) injection 2.5-5 mg  2.5-5 mg Intravenous Q2H PRN Sharlene Doryessa N Conte, PA-C   5 mg at 01/29/16 0506  . metoprolol tartrate (LOPRESSOR) tablet 12.5 mg  12.5 mg Oral BID Sharlene Doryessa N Conte, PA-C   12.5 mg at 02/03/16 2305  . mometasone-formoterol (DULERA) 200-5 MCG/ACT inhaler 2 puff  2 puff Inhalation BID Kerin PernaPeter Van Trigt, MD   2 puff at 02/04/16 0840  . nicotine (NICODERM CQ - dosed in mg/24 hours) patch 21 mg  21 mg Transdermal Daily Kerin PernaPeter Van Trigt, MD   21 mg at 02/03/16 1047  . ondansetron (ZOFRAN) injection 4 mg  4 mg Intravenous Q6H PRN Sharlene Doryessa N Conte, PA-C      . potassium chloride SA (K-DUR,KLOR-CON) CR tablet 20 mEq  20 mEq Oral Daily Kerin PernaPeter Van Trigt, MD   20 mEq at 02/03/16 1048  . sodium chloride flush (NS) 0.9 % injection 10-40 mL  10-40 mL Intracatheter Q12H Kerin PernaPeter Van Trigt, MD   10 mL at 02/02/16 1044  . sodium chloride flush (NS) 0.9 % injection 10-40 mL  10-40 mL Intracatheter PRN Kerin PernaPeter Van Trigt, MD   10 mL at 02/03/16 0443  . sodium chloride flush (NS) 0.9 % injection 3 mL  3 mL Intravenous Q12H Sharlene Doryessa N Conte, PA-C   3 mL at 02/01/16 0920  . sodium chloride flush (NS) 0.9 % injection 3 mL  3 mL Intravenous PRN Sharlene Doryessa N Conte, PA-C      . sodium chloride flush (NS) 0.9 % injection 3 mL  3 mL Intravenous Q12H Kerin PernaPeter Van Trigt, MD   3 mL at 02/01/16 2146  . sodium chloride flush (NS) 0.9 % injection 3 mL  3 mL Intravenous PRN Kerin PernaPeter Van Trigt, MD      . thiamine (VITAMIN B-1) tablet 100 mg  100 mg Oral Daily Kerin PernaPeter Van Trigt, MD      . traMADol Janean Sark(ULTRAM) tablet 50-100 mg  50-100 mg Oral Q4H PRN Sharlene Doryessa N Conte, PA-C   50 mg at 02/02/16 2355     Discharge Medications: Please see discharge summary for a list of discharge medications.  Relevant Imaging Results:  Relevant Lab Results:   Additional  Information SS#:  [id number]  Burna Sis, LCSW

## 2016-02-04 NOTE — Discharge Instructions (Signed)
Coronary Artery Bypass Grafting, Care After °Refer to this sheet in the next few weeks. These instructions provide you with information on caring for yourself after your procedure. Your health care provider may also give you more specific instructions. Your treatment has been planned according to current medical practices, but problems sometimes occur. Call your health care provider if you have any problems or questions after your procedure. °WHAT TO EXPECT AFTER THE PROCEDURE °Recovery from surgery will be different for everyone. Some people feel well after 3 or 4 weeks, while for others it takes longer. After your procedure, it is typical to have the following: °· Nausea and a lack of appetite.   °· Constipation. °· Weakness and fatigue.   °· Depression or irritability.   °· Pain or discomfort at your incision site. °HOME CARE INSTRUCTIONS °· Take medicines only as directed by your health care provider. Do not stop taking medicines or start any new medicines without first checking with your health care provider. °· Take your pulse as directed by your health care provider. °· Perform deep breathing as directed by your health care provider. If you were given a device called an incentive spirometer, use it to practice deep breathing several times a day. Support your chest with a pillow or your arms when you take deep breaths or cough. °· Keep incision areas clean, dry, and protected. Remove or change any bandages (dressings) only as directed by your health care provider. You may have skin adhesive strips over the incision areas. Do not take the strips off. They will fall off on their own. °· Check incision areas daily for any swelling, redness, or drainage. °· If incisions were made in your legs, do the following: °¨ Avoid crossing your legs.   °¨ Avoid sitting for long periods of time. Change positions every 30 minutes.   °¨ Elevate your legs when you are sitting. °· Wear compression stockings as directed by your  health care provider. These stockings help keep blood clots from forming in your legs. °· Take showers once your health care provider approves. Until then, only take sponge baths. Pat incisions dry. Do not rub incisions with a washcloth or towel. Do not take baths, swim, or use a hot tub until your health care provider approves. °· Eat foods that are high in fiber, such as raw fruits and vegetables, whole grains, beans, and nuts. Meats should be lean cut. Avoid canned, processed, and fried foods. °· Drink enough fluid to keep your urine clear or pale yellow. °· Weigh yourself every day. This helps identify if you are retaining fluid that may make your heart and lungs work harder. °· Rest and limit activity as directed by your health care provider. You may be instructed to: °¨ Stop any activity at once if you have chest pain, shortness of breath, irregular heartbeats, or dizziness. Get help right away if you have any of these symptoms. °¨ Move around frequently for short periods or take short walks as directed by your health care provider. Increase your activities gradually. You may need physical therapy or cardiac rehabilitation to help strengthen your muscles and build your endurance. °¨ Avoid lifting, pushing, or pulling anything heavier than 10 lb (4.5 kg) for at least 6 weeks after surgery. °· Do not drive until your health care provider approves.  °· Ask your health care provider when you may return to work. °· Ask your health care provider when you may resume sexual activity. °· Keep all follow-up visits as directed by your health care   provider. This is important. °SEEK MEDICAL CARE IF: °· You have swelling, redness, increasing pain, or drainage at the site of an incision. °· You have a fever. °· You have swelling in your ankles or legs. °· You have pain in your legs.   °· You gain 2 or more pounds (0.9 kg) a day. °· You are nauseous or vomit. °· You have diarrhea.  °SEEK IMMEDIATE MEDICAL CARE IF: °· You have  chest pain that goes to your jaw or arms. °· You have shortness of breath.   °· You have a fast or irregular heartbeat.   °· You notice a "clicking" in your breastbone (sternum) when you move.   °· You have numbness or weakness in your arms or legs. °· You feel dizzy or light-headed.   °MAKE SURE YOU: °· Understand these instructions. °· Will watch your condition. °· Will get help right away if you are not doing well or get worse. °  °This information is not intended to replace advice given to you by your health care provider. Make sure you discuss any questions you have with your health care provider. °  °Document Released: 11/19/2004 Document Revised: 05/23/2014 Document Reviewed: 10/09/2012 °Elsevier Interactive Patient Education ©2016 Elsevier Inc. ° °

## 2016-02-04 NOTE — Progress Notes (Signed)
Report called to [facility]. All questions were answered.   Berdine Dance BSN, RN

## 2016-02-04 NOTE — Discharge Summary (Signed)
Physician Discharge Summary  Patient ID: Darryl George MRN: [id number] DOB/AGE: 67/06/1950 67 y.o.  Admit date: 01/23/2016 Discharge date: 02/04/2016  Admission Diagnoses: Patient Active Problem List   Diagnosis Date Noted  . Paroxysmal atrial fibrillation (HCC) 01/28/2016  . Essential hypertension   . COPD (chronic obstructive pulmonary disease) (HCC)   . Stroke (HCC)   . Tobacco abuse   . Coronary artery disease involving native coronary artery of native heart without angina pectoris 01/23/2016  . ACS (acute coronary syndrome) (HCC) 01/23/2016  . COPD bronchitis 01/23/2016  . Chronic anticoagulation 01/23/2016    Discharge Diagnoses:  Principal Problem:   ACS (acute coronary syndrome) (HCC) Active Problems:   Coronary artery disease involving native coronary artery of native heart without angina pectoris   COPD bronchitis   Chronic anticoagulation   Essential hypertension   COPD (chronic obstructive pulmonary disease) (HCC)   Tobacco abuse   Paroxysmal atrial fibrillation (HCC)   Discharged Condition: good  HPI:  The patient is a 65 year old male who presented to the emergency department yesterday after developing acute shortness of breath while mowing his grass. In addition to the shortness of breath, he developed bilateral arm pain that was described as severe and radiated a 9 on a scale of 1-10. The patient has a history of hypertension and COPD as well as previous CVA. He also smokes 1 pack per day of cigarettes. He has been on chronic Coumadin since the stroke in March that was found on MRI during an evaluation for dizziness. Initial EKG done in the emergency department was suspicious for inferior myocardial infarction. He was initially treated with sublingual nitroglycerin and aspirin and pain did decrease to a 2. He was also started on heparin and cardiology consultation was obtained with Dr. Verdis PrimeHenry George. A code STEMI was called as he did have some elevation in lead 2. He  was taken to the catheterization lab where he was found to have severe multivessel coronary artery disease. There was a heavy thrombus burden in the proximal RCA and this is felt to be the culprit vessel for the patient's ST elevation MI. It appears that this spontaneously recanalized. There was also noted to be severe LAD disease with multifocal proximal to mid artery 85% stenosis and an 85% stenosis in the large diagonal. There is also an 80% obstructive lesion in the second moderately sized diagonal vessel. There is a 50% ostial lesion in the ramus coronary artery. Additionally there was a 40-50% proximal to mid circumflex lesion. Due to these findings cardiothoracic surgical consultation is obtained for opinion on coronary artery surgical revascularization. The full catheterization report is listed below. Peak Troponin is 1.15.   Hospital Course:  On 01/25/2016 Darryl George underwent a CABG x4 with Dr. Donata ClayVan George. He tolerated the procedure well and was transferred to ICU and remained hemodynamically stable. POD 1 we initiated a diuretic regimen for fluid overload. His chest tubes and line were discontinued. We continued tight glycemic control. Continued on renal dose dopamine for postop creatinine level. Hemoglobin stable. Had some post-op delirium therefore we avoided narcotics. We continued bronchodilators for his COPD. Initiated antibiotics for possible pneumonia. POD 7 he was transferred to the telemetry floor. He continued to progress appropriately and worked with cardiac rehab to improve mobility. He remained in normal sinus rhythm. We will continue lasix for 3 more days. Okay to discontinue antibiotic per attending. No coumadin therapy due to non-compliance. He is now tolerating room air and ready for discharge to a  SNF. Incision is healing well.   Consults: cardiology  Significant Diagnostic Studies:  Treatments:   OPERATIVE REPORT   OPERATION: 1. Coronary artery bypass grafting x5 (left  internal mammary artery to     LAD, saphenous vein graft to second diagonal, sequential saphenous     vein graft to ramus intermedius and obtuse marginal, saphenous vein     graft to RCA). 2. Endoscopic harvest of right leg greater saphenous vein.  SURGEON:  Darryl George, M.D.  ASSISTANT:  Darryl Favre, PA-C.  Discharge Exam: Blood pressure 125/72, pulse 76, temperature 98.6 F (37 C), temperature source Oral, resp. rate 19, height 6\' 1"  (1.854 m), weight 168 lb 3.2 oz (76.3 kg), SpO2 93 %.  General appearance: alert, cooperative and no distress Heart: regular rate and rhythm, S1, S2 normal, no murmur, click, rub or gallop Lungs: clear to auscultation bilaterally Abdomen: soft, non-tender; bowel sounds normal; no masses,  no organomegaly Extremities: extremities normal, atraumatic, no cyanosis or edema Wound: c/d/i without drainage  Disposition: SNF  Discharge Instructions    Amb Referral to Cardiac Rehabilitation    Complete by:  As directed    Diagnosis:   CABG NSTEMI     CABG X ___:  5       Medication List    STOP taking these medications   albuterol 108 (90 Base) MCG/ACT inhaler Commonly known as:  PROVENTIL HFA;VENTOLIN HFA   diltiazem 240 MG 24 hr capsule Commonly known as:  DILACOR XR   lisinopril 40 MG tablet Commonly known as:  PRINIVIL,ZESTRIL   warfarin 1 MG tablet Commonly known as:  COUMADIN     TAKE these medications   amiodarone 200 MG tablet Commonly known as:  PACERONE Take 1 tablet (200 mg total) by mouth 2 (two) times daily.   aspirin 325 MG EC tablet Take 1 tablet (325 mg total) by mouth daily. Start taking on:  02/05/2016   atorvastatin 80 MG tablet Commonly known as:  LIPITOR Take 1 tablet (80 mg total) by mouth daily at 6 PM.   furosemide 40 MG tablet Commonly known as:  LASIX Take 1 tablet (40 mg total) by mouth daily. Start taking on:  02/05/2016   levalbuterol 1.25 MG/0.5ML nebulizer solution Commonly known as:   XOPENEX Take 1.25 mg by nebulization every 6 (six) hours as needed for wheezing or shortness of breath.   metoprolol tartrate 25 MG tablet Commonly known as:  LOPRESSOR Take 0.5 tablets (12.5 mg total) by mouth 2 (two) times daily.   mometasone-formoterol 200-5 MCG/ACT Aero Commonly known as:  DULERA Inhale 2 puffs into the lungs 2 (two) times daily.   potassium chloride SA 20 MEQ tablet Commonly known as:  K-DUR,KLOR-CON Take 1 tablet (20 mEq total) by mouth daily. Start taking on:  02/05/2016   thiamine 100 MG tablet Take 1 tablet (100 mg total) by mouth daily.   traMADol 50 MG tablet Commonly known as:  ULTRAM Take 1 tablet (50 mg total) by mouth every 4 (four) hours as needed for moderate pain.      Follow-up Information    [name] Suann Larry, MD .   Specialty:  Cardiothoracic Surgery Why:  Your appointment is on October 18th at 1:00pm. Please report to [facility] imaging at 12:30 for a chest xray. They are located on the first floor of our building.  Contact information: [address] [place] [address] [phone]        Lyn Records III,  MD. Call in 2 week(s).   Specialty:  Cardiology Why:  Please call the office for an appointment in 2-4 weeks Contact information: [address] [place] [address] [phone]           The patient has been discharged on:   1.Beta Blocker:  Yes [x   ]                              No   [   ]                              If No, reason:  2.Ace Inhibitor/ARB: Yes [   ]                                     No  [ x   ]                                     If No, reason:titrating beta blocker  3.Statin:   Yes [ x  ]                  No  [   ]                  If No, reason:  4.Ecasa:  Yes  [x   ]                  No   [   ]                  If No, reason:    Signed: Sharlene Dory 02/04/2016, 1:32 PM

## 2016-02-04 NOTE — Progress Notes (Signed)
Pt has bed today at [facility]- will be very short stay due to pt high mobility but should be approved for 5 days for supervision following surgery- pt in agreement  MD informed of bed for today and plans to DC  Burna Sis, [facility] Clinical Social Worker [phone]

## 2016-02-04 NOTE — Progress Notes (Signed)
      [address]       [place] [address]             [phone]      10 Days Post-Op Procedure(s) (LRB): CORONARY ARTERY BYPASS GRAFTING (CABG) times five using left internal mammary artery and right saphenous vein. (N/A) TRANSESOPHAGEAL ECHOCARDIOGRAM (TEE) Subjective: Wants to go home. No issues overnight  Objective: Vital signs in last 24 hours: Temp:  [97.7 F (36.5 C)-97.9 F (36.6 C)] 97.7 F (36.5 C) (09/21 0507) Pulse Rate:  [66-85] 73 (09/21 0507) Cardiac Rhythm: Normal sinus rhythm (09/20 1901) Resp:  [18] 18 (09/21 0507) BP: (101-131)/(58-82) 123/65 (09/21 0507) SpO2:  [92 %-97 %] 95 % (09/21 0507) Weight:  [168 lb 3.2 oz (76.3 kg)] 168 lb 3.2 oz (76.3 kg) (09/21 0507)    Intake/Output from previous day: 09/20 0701 - 09/21 0700 In: 240 [P.O.:240] Out: 225 [Urine:225] Intake/Output this shift: No intake/output data recorded.  General appearance: alert, cooperative and no distress Heart: regular rate and rhythm, S1, S2 normal, no murmur, click, rub or gallop Lungs: clear to auscultation bilaterally Abdomen: soft, non-tender; bowel sounds normal; no masses,  no organomegaly Extremities: extremities normal, atraumatic, no cyanosis or edema Wound: c/d/i without drainage  Lab Results:  Recent Labs  02/02/16 0400 02/03/16 0441  WBC 12.2* 15.4*  HGB 9.7* 10.1*  HCT 30.6* 32.5*  PLT 429* 541*   BMET:  Recent Labs  02/02/16 0400 02/03/16 0441  NA 139 135  K 3.8 4.2  CL 103 102  CO2 28 24  GLUCOSE 114* 88  BUN 31* 28*  CREATININE 1.43* 1.46*  CALCIUM 8.5* 8.8*    PT/INR: No results for input(s): LABPROT, INR in the last 72 hours. ABG    Component Value Date/Time   PHART 7.475 (H) 01/30/2016 0335   HCO3 23.7 01/30/2016 0335   TCO2 27 02/01/2016 1648   ACIDBASEDEF 4.0 (H) 01/25/2016 2000   O2SAT 85.6 01/30/2016 0335   CBG (last 3)   Recent Labs  02/01/16 1631 02/01/16 1951 02/03/16 2027  GLUCAP 107* 128* 87     Assessment/Plan: S/P Procedure(s) (LRB): CORONARY ARTERY BYPASS GRAFTING (CABG) times five using left internal mammary artery and right saphenous vein. (N/A) TRANSESOPHAGEAL ECHOCARDIOGRAM (TEE)  1. CV- NSR in the 70-80s. BP good on current regimen 2. Pulm- Tolerating RA.  Encouraged Is/flutter valve use 3. Renal- creatinine stable. Continue Lasix. Below baseline weight 4. Endo-well controlled 5. Continue PO antibiotics  Plan:  Mobilize. Plan to discharge to facility, working with case management and social work. Attempted to call social work this morning and left a message. Although patient prefers home, would consider a facility due to lack of resources at home.    LOS: 11 days   LOS: 12 days    Sharlene Dory 02/04/2016

## 2016-02-04 NOTE — Care Management Note (Signed)
Case Management Note Darryl Pierini RN, BSN Unit 2W-Case Manager [id number]  Patient Details  Name: [facility] MRN: [id number] Date of Birth: Oct 03, 1950  Subjective/Objective:  Pt admitted with ACS- s/p CABG on 9/11- tx from ICU to 2W on 02/02/16                  Action/Plan: PTA pt lived at home with SO- independent- has sister who lives in [place] who is working on pulling things together for 24/7 assistance at discharge- plan is to return home- per conversation with pt on 9/20- he states that he is a [place] pt at the [facility]- he gets his medications via mail order through the [place]- PCP is Dennison Mascot at the [place] there in [place]- pt request that the [place] be notified of his admission- call made to the [place] in [place] and spoke with Lawson Fiscal- per Lawson Fiscal pt will need to have any new prescriptions approved by this PCP before they can be sent out via mail order- if pt needs them immediately then he can go to the ED pharmacy there at the [facility] [place] to have them filled. Call also made to the [facility] to notify of pt's admission. CM to continue to follow for d/c needs   Expected Discharge Date:      02/04/16            Expected Discharge Plan:  Skilled Nursing Facility  In-House Referral:  Clinical Social Work  Discharge planning Services  CM Consult  Post Acute Care Choice:    Choice offered to:     DME Arranged:    DME Agency:     HH Arranged:    HH Agency:     Status of Service:  Completed, signed off  If discussed at [employer] of Stay Meetings, dates discussed:  02/04/16  Discharge Disposition: skilled facility   Additional Comments:  02/04/16- 1340- Darryl Stivers RN, CM- pt ready for d/c today- plan is for short stay SNF - CSW following- plan for [facility]  02/03/16- Darryl Pierini RN, CM- spoke with pt at bedside- regarding d/c plan- per pt he states that he is either going home or to rehab- call made to [facility] to discuss plans- per Florentina Addison- she works 12hr shifts and can not provide  needed supervision- she is not sure sister can assist and they are not that close to neighbors- she is concerned that pt may try to "do too much" when she is not there. States she and MD spoke and would like to see if pt can be approved for short SNF stay- have called CSW to check into possible SNF placement-   Darryl Span, RN 02/04/2016, 1:39 PM [phone]

## 2016-02-05 ENCOUNTER — Telehealth: Payer: Self-pay | Admitting: *Deleted

## 2016-02-05 NOTE — Telephone Encounter (Signed)
Per discharge summary pt was transferred to [facility].

## 2016-02-08 ENCOUNTER — Non-Acute Institutional Stay (SKILLED_NURSING_FACILITY): Payer: Medicare Other | Admitting: Internal Medicine

## 2016-02-08 ENCOUNTER — Encounter: Payer: Self-pay | Admitting: Internal Medicine

## 2016-02-08 ENCOUNTER — Encounter (HOSPITAL_COMMUNITY)
Admission: RE | Admit: 2016-02-08 | Discharge: 2016-02-08 | Disposition: A | Discharge status: Home / Self Care | Payer: Medicare Other | Source: Skilled Nursing Facility | Attending: Internal Medicine | Admitting: Internal Medicine

## 2016-02-08 DIAGNOSIS — Z951 Presence of aortocoronary bypass graft: Secondary | ICD-10-CM | POA: Diagnosis not present

## 2016-02-08 DIAGNOSIS — I213 ST elevation (STEMI) myocardial infarction of unspecified site: Secondary | ICD-10-CM | POA: Insufficient documentation

## 2016-02-08 DIAGNOSIS — I10 Essential (primary) hypertension: Secondary | ICD-10-CM | POA: Insufficient documentation

## 2016-02-08 DIAGNOSIS — I249 Acute ischemic heart disease, unspecified: Secondary | ICD-10-CM

## 2016-02-08 DIAGNOSIS — J410 Simple chronic bronchitis: Secondary | ICD-10-CM | POA: Diagnosis not present

## 2016-02-08 DIAGNOSIS — I480 Paroxysmal atrial fibrillation: Secondary | ICD-10-CM

## 2016-02-08 DIAGNOSIS — Z720 Tobacco use: Secondary | ICD-10-CM

## 2016-02-08 DIAGNOSIS — Z48812 Encounter for surgical aftercare following surgery on the circulatory system: Secondary | ICD-10-CM | POA: Insufficient documentation

## 2016-02-08 LAB — BASIC METABOLIC PANEL
ANION GAP: 9 (ref 5–15)
BUN: 23 mg/dL — ABNORMAL HIGH (ref 6–20)
CALCIUM: 9.1 mg/dL (ref 8.9–10.3)
CHLORIDE: 101 mmol/L (ref 101–111)
CO2: 23 mmol/L (ref 22–32)
Creatinine, Ser: 1.48 mg/dL — ABNORMAL HIGH (ref 0.61–1.24)
GFR calc Af Amer: 55 mL/min — ABNORMAL LOW (ref >60)
GFR calc non Af Amer: 47 mL/min — ABNORMAL LOW (ref >60)
GLUCOSE: 90 mg/dL (ref 65–99)
POTASSIUM: 4.4 mmol/L (ref 3.5–5.1)
Sodium: 133 mmol/L — ABNORMAL LOW (ref 135–145)

## 2016-02-08 NOTE — Progress Notes (Signed)
Provider:  Einar Crow Location:   [facility] Room Number: 117/W Place of Service:  SNF (31)  PCP: PROVIDER NOT IN SYSTEM Patient Care Team: Provider Not In System as PCP - General  Extended Emergency Contact Information Primary Emergency Contact: [facility] Address: [address]          [place], [place] [address] [place] Home Phone: [phone] Relation: Significant other  Code Status: Full Code Goals of Care: Advanced Directive information Advanced Directives 02/08/2016  Does patient have an advance directive? Yes  Type of Advance Directive (No Data)  Does patient want to make changes to advanced directive? No - Patient declined  Copy of advanced directive(s) in chart? Yes  Would patient like information on creating an advanced directive? -      Chief Complaint  Patient presents with  . New Admit To SNF    HPI: Patient is a 65 y.o. male seen today for admission to SNF for Physical Therapy after CABG 4 With Dr Zenaida Niece Tight. He was admitted initially for Chest Pain and his Cath showed Multi vessel disease with significant RCA involvement. Patient underwent CABG. Post op had some delirium otherwise stayed hemodynamically stable. He is doing well in SNF. Walking by himself and wants to go home today. He is not having Chest pain, SOB. Some cough but no sputum. He says he has Girl freind at home who can take care of him. He worked with the therapist this morning and seems to think he can follow instructions at home.  Past Medical History:  Diagnosis Date  . Back pain   . COPD (chronic obstructive pulmonary disease) (HCC)   . Hypertension   . Stroke [facility])    Noted on MRI scan performed at [facility]  . Tobacco abuse    Past Surgical History:  Procedure Laterality Date  . CARDIAC CATHETERIZATION N/A 01/23/2016   Procedure: Left Heart Cath and Coronary Angiography;  Surgeon: Lyn Records, MD;  Location: [facility] INVASIVE CV LAB;  Service:  Cardiovascular;  Laterality: N/A;  . CORONARY ARTERY BYPASS GRAFT N/A 01/25/2016   Procedure: CORONARY ARTERY BYPASS GRAFTING (CABG) times five using left internal mammary artery and right saphenous vein.;  Surgeon: Kerin Perna, MD;  Location: [facility] OR;  Service: Open Heart Surgery;  Laterality: N/A;  . EYE SURGERY    . HERNIA REPAIR    . TEE WITHOUT CARDIOVERSION  01/25/2016   Procedure: TRANSESOPHAGEAL ECHOCARDIOGRAM (TEE);  Surgeon: Kerin Perna, MD;  Location: [facility] OR;  Service: Open Heart Surgery;;    reports that he has been smoking Cigarettes.  He has been smoking about 1.00 pack per day. He has never used smokeless tobacco. He reports that he does not drink alcohol or use drugs. Social History   Social History  . Marital status: Divorced    Spouse name: N/A  . Number of children: N/A  . Years of education: N/A   Occupational History  . Not on file.   Social History Main Topics  . Smoking status: Current Every Day Smoker    Packs/day: 1.00    Types: Cigarettes  . Smokeless tobacco: Never Used  . Alcohol use No     Comment: once a month  . Drug use: No  . Sexual activity: Not on file   Other Topics Concern  . Not on file   Social History Narrative  . No narrative on file    Functional Status Survey:    Family  History  Problem Relation Age of Onset  . Cancer Mother   . Heart failure Mother   . Kidney disease Mother   . Aneurysm Father   . Hypertension Father   . Cancer Other   . Breast cancer Sister     Health Maintenance  Topic Date Due  . Hepatitis C Screening  05-20-1950  . TETANUS/TDAP  07/16/1967  . COLONOSCOPY  07/16/1998  . ZOSTAVAX  07/15/2008  . PNA vac Low Risk Adult (1 of 2 - PCV13) 07/15/2013  . INFLUENZA VACCINE  04/15/2016 (Originally 12/15/2015)    No Known Allergies  Current Outpatient Prescriptions on File Prior to Visit  Medication Sig Dispense Refill  . amiodarone (PACERONE) 200 MG tablet Take 1 tablet (200 mg total) by mouth 2  (two) times daily. 30 tablet 1  . aspirin EC 325 MG EC tablet Take 1 tablet (325 mg total) by mouth daily. 30 tablet 0  . atorvastatin (LIPITOR) 80 MG tablet Take 1 tablet (80 mg total) by mouth daily at 6 PM. 30 tablet 1  . levalbuterol (XOPENEX) 1.25 MG/0.5ML nebulizer solution Take 1.25 mg by nebulization every 6 (six) hours as needed for wheezing or shortness of breath. 1 each 12  . metoprolol tartrate (LOPRESSOR) 25 MG tablet Take 0.5 tablets (12.5 mg total) by mouth 2 (two) times daily. 30 tablet 1  . mometasone-formoterol (DULERA) 200-5 MCG/ACT AERO Inhale 2 puffs into the lungs 2 (two) times daily. 1 Inhaler 12  . potassium chloride SA (K-DUR,KLOR-CON) 20 MEQ tablet Take 1 tablet (20 mEq total) by mouth daily. 3 tablet 0  . thiamine 100 MG tablet Take 1 tablet (100 mg total) by mouth daily. 30 tablet 1  . traMADol (ULTRAM) 50 MG tablet Take 1 tablet (50 mg total) by mouth every 4 (four) hours as needed for moderate pain. 30 tablet 0   No current facility-administered medications on file prior to visit.      Review of Systems  Constitutional: Negative for activity change, appetite change, chills, diaphoresis and fever.  HENT: Positive for congestion and ear pain. Negative for ear discharge, hearing loss, mouth sores, nosebleeds, postnasal drip, rhinorrhea, sinus pressure and sneezing.   Respiratory: Negative.   Cardiovascular: Negative.   Gastrointestinal: Negative.     Vitals:   02/08/16 1534  BP: (!) 152/84  Pulse: 93  Resp: 18  Temp: 97.9 F (36.6 C)  TempSrc: Oral   There is no height or weight on file to calculate BMI. Physical Exam  Constitutional: He is oriented to person, place, and time. He appears well-developed and well-nourished.  HENT:  Head: Normocephalic.  Mouth/Throat: Oropharynx is clear and moist.  Eyes: Pupils are equal, round, and reactive to light.  Cardiovascular: Normal rate, regular rhythm and normal heart sounds.   Pulmonary/Chest: Effort normal  and breath sounds normal. No respiratory distress. He has no wheezes. He has no rales.  Abdominal: Soft. Bowel sounds are normal. He exhibits no distension. There is no tenderness. There is no rebound and no guarding.  Musculoskeletal:  Trace edema in the right leg.  Neurological: He is alert and oriented to person, place, and time.  Skin:  Scar is healing well in the chest.    Labs reviewed: Basic Metabolic Panel:  Recent Labs  60/45/4008/04/01 1955  01/26/16 0445 01/26/16 1636  02/02/16 0400 02/03/16 0441 02/08/16 0700  NA  --   < > 134* 134*  < > 139 135 133*  K  --   < >  4.2 4.0  < > 3.8 4.2 4.4  CL  --   < > 103 97*  < > 103 102 101  CO2  --   --  22  --   < > 28 24 23   GLUCOSE  --   < > 140* 137*  < > 114* 88 90  BUN  --   < > 13 22*  < > 31* 28* 23*  CREATININE 1.26*  < > 1.40* 1.95*  1.80*  < > 1.43* 1.46* 1.48*  CALCIUM  --   --  8.1*  --   < > 8.5* 8.8* 9.1  MG 3.1*  --  2.6* 2.6*  --   --   --   --   < > = values in this interval not displayed. Liver Function Tests:  Recent Labs  01/30/16 0411 01/31/16 0417 02/01/16 0340  AST 79* 62* 49*  ALT 58 61 58  ALKPHOS 198* 234* 207*  BILITOT 1.3* 1.6* 1.3*  PROT 5.2* 5.4* 5.9*  ALBUMIN 2.7* 2.6* 2.6*   No results for input(s): LIPASE, AMYLASE in the last 8760 hours. No results for input(s): AMMONIA in the last 8760 hours. CBC:  Recent Labs  01/23/16 1814  02/01/16 0340 02/01/16 1648 02/02/16 0400 02/03/16 0441  WBC 13.2*  < > 13.8*  --  12.2* 15.4*  NEUTROABS 7.9*  --   --   --   --   --   HGB 14.9  < > 10.0* 9.9* 9.7* 10.1*  HCT 42.0  < > 31.2* 29.0* 30.6* 32.5*  MCV 83.7  < > 88.6  --  89.5 90.0  PLT 350  < > 399  --  429* 541*  < > = values in this interval not displayed. Cardiac Enzymes:  Recent Labs  01/24/16 1424 01/24/16 1950 01/25/16 0237  TROPONINI 0.57* 0.44* 0.38*   BNP: Invalid input(s): POCBNP Lab Results  Component Value Date   HGBA1C 5.4 01/24/2016   No results found for: TSH No  results found for: VITAMINB12 No results found for: FOLATE No results found for: IRON, TIBC, FERRITIN  Imaging and Procedures obtained prior to SNF admission: No results found.  Assessment/Plan  Status post four vessel coronary artery bypass  Patient doing very well and wants to go home. He will be discharged home with home health. He has follow up with Cardiology and Cardiothoracic surgeon. Will need follow up for his Hyponatremia and Hgb.   Paroxysmal atrial fibrillation  Patient was taken off coumadin for non compliance. It seems he was getting coumadin from [place] but then not getting any follow up with them. He says he is not interested in taking coumadin again as he didn't like it in first place due to bruises.Keep off coumadin. [facility] needs to be [facility] it. Continue Aspirin.  Essential hypertension BP has been very stable in facility with Systolic in 120. Continue Metoprolol.   chronic bronchitis  Stable on Inhalers  Tobacco abuse Patient is not smoking anymore and says he wants to Quit Tobacco cessation counseling done.  Anxiety related to Surgery and not smoking. Says he does not want any antianxiety medicine  right now. Will consider it if it doesn't get better at home.    Patient will be discharged home with Home health care and follow up with Cardiology and Cardiothoracic surgeon. He needs to limit his activity as suggested by Physical therapist.  Family/ staff Communication:   Labs/tests  ordered:

## 2016-02-09 ENCOUNTER — Encounter: Payer: Self-pay | Admitting: Cardiology

## 2016-02-10 DIAGNOSIS — Z951 Presence of aortocoronary bypass graft: Secondary | ICD-10-CM | POA: Diagnosis not present

## 2016-02-10 DIAGNOSIS — Z8673 Personal history of transient ischemic attack (TIA), and cerebral infarction without residual deficits: Secondary | ICD-10-CM | POA: Diagnosis not present

## 2016-02-10 DIAGNOSIS — I129 Hypertensive chronic kidney disease with stage 1 through stage 4 chronic kidney disease, or unspecified chronic kidney disease: Secondary | ICD-10-CM | POA: Diagnosis not present

## 2016-02-10 DIAGNOSIS — I482 Chronic atrial fibrillation: Secondary | ICD-10-CM | POA: Diagnosis not present

## 2016-02-10 DIAGNOSIS — Z87891 Personal history of nicotine dependence: Secondary | ICD-10-CM | POA: Diagnosis not present

## 2016-02-10 DIAGNOSIS — J449 Chronic obstructive pulmonary disease, unspecified: Secondary | ICD-10-CM | POA: Diagnosis not present

## 2016-02-10 DIAGNOSIS — Z48812 Encounter for surgical aftercare following surgery on the circulatory system: Secondary | ICD-10-CM | POA: Diagnosis not present

## 2016-02-10 DIAGNOSIS — N183 Chronic kidney disease, stage 3 (moderate): Secondary | ICD-10-CM | POA: Diagnosis not present

## 2016-02-10 DIAGNOSIS — I2510 Atherosclerotic heart disease of native coronary artery without angina pectoris: Secondary | ICD-10-CM | POA: Diagnosis not present

## 2016-02-10 NOTE — Progress Notes (Signed)
Cardiology Office Note   Date:  02/11/2016   ID:  Darryl George, DOB Jun 22, 1950, MRN [id number]  PCP:  PROVIDER NOT IN SYSTEM  Dr. Lynett [facility] at the [facility] [phone].  Cardiologist:  Dr. Katrinka George    Chief Complaint  Patient presents with  . Hospitalization Follow-up    post STEMI CABG       History of Present Illness: Darryl George is a 65 y.o. male who presents for post hospitalization for STEMI and recannulization of pRCA and then CABG.  He has a hx ofprior stroke on coumadin, hypertension, COPD,  tobacco use.   He was admitted 01/23/16 with SOB, bil arm pain and eventually chest pain all sharp and some nausea. Marland Kitchen  He went to ER at [facility] and  Dx of inf. STEMI.  This was aborted in the cath [facility] with  recanalization of the proximal RCA to a thrombus containing 85% segmental stenosis.  Additionally with severe diffuse prox to mLAD 80-90%.There is also an 80% obstructive lesion in the second moderately sized diagonal vessel. There is a 50% ostial lesion in the ramus coronary artery. Additionally there was a 40-50% proximal to mid circumflex lesion EF 65%.  TCTS was contacted for CABG.   01/25/16 pt underwent Coronary artery bypass grafting x5 (left internal mammary artery to     LAD, saphenous vein graft to second diagonal, sequential saphenous     vein graft to ramus intermedius and obtuse marginal, saphenous vein     graft to RCA).  Endoscopic harvest of right leg greater saphenous vein   Post op he had some post op delirium  So narcotics were avoided. Marland Kitchen His COPD was treated and he was started on ABX for PNA. Pt was non compliant on coumadin as outptt so it was stopped.  He did have post op a fib, on amiodarone. By 01/30/16 pt went into SR. [facility] recommended anticoagulation if recurrent a fib.  Pt was discharged 02/04/16 to SNF at [facility].   Today he is anxious not sleeping well.  No longer at the [facility].  He has PT and RN come to his home.  He is walking long periods  of time per day.  He does become SOB.  Could not afford Xopenex so using his albuterol.  Pt does not remember his [facility] stay.  We reviewed what happened and why he is on certain meds.   When he left the [facility] he was only given scripts for amio, metoprolol, and nicotine.  He doubled his atorvastatin to 2 daily.  HHRN helped him with his meds not to take previous meds.   He has not smoked, only taking 1 puff today due to anxiety.  His pain is not bad but no pain meds, he could not take narcotics due to confusion.   He would like [place] to know his PCP Dr. Dennie [facility] at [facility]- we will send copy of discharge and scripts to him.   Pt's appetite is coming back, no other complaints. No chest pain except incisional.  We discussed if rapid HR George irregular to call to be seen [facility] if symptomatic to go to ER.   Past Medical History:  Diagnosis Date  . Back pain   . COPD (chronic obstructive pulmonary disease) (HCC)   . Hypertension   . Stroke [facility])    Noted on MRI scan performed at [facility]  . Tobacco abuse  Past Surgical History:  Procedure Laterality Date  . CARDIAC CATHETERIZATION N/A 01/23/2016   Procedure: Left Heart Cath and Coronary Angiography;  Surgeon: Darryl Records, MD;  Location: [facility];  Service: Cardiovascular;  Laterality: N/A;  . CORONARY ARTERY BYPASS GRAFT N/A 01/25/2016   Procedure: CORONARY ARTERY BYPASS GRAFTING (CABG) times five using left internal mammary artery and right saphenous vein.;  Surgeon: [facility]ryl Perna, MD;  [facility];  Service: Open Heart Surgery;  Laterality: N/A;  . EYE SURGERY    . HERNIA REPAIR    . TEE WITHOUT CARDIOVERSION  01/25/2016   Procedure: TRANSESOPHAGEAL ECHOCARDIOGRAM (TEE);  Surgeon: Darryl Perna, MD;  Location: [facility];  Service: Open Heart Surgery;;     No current facility-administered medications for this visit.    Current Outpatient Prescriptions  Medication Sig Dispense Refill  . amiodarone (PACERONE) 200 MG  tablet Take 1 tablet (200 mg total) by mouth 2 (two) times daily. 180 tablet 3  . atorvastatin (LIPITOR) 80 MG tablet Take 1 tablet (80 mg total) by mouth daily at 6 PM. 90 tablet 3  . metoprolol tartrate (LOPRESSOR) 25 MG tablet Take 0.5 tablets (12.5 mg total) by mouth 2 (two) times daily. 90 tablet 3  . nicotine (NICODERM CQ - DOSED IN MG/24 HOURS) 14 mg/24hr patch Place 1 patch (14 mg total) onto the skin daily. From 11/4/17until 04/02/16 28 patch 0  . nicotine (NICODERM CQ - DOSED IN MG/24 HOURS) 21 mg/24hr patch Place 1 patch (21 mg total) onto the skin daily. From 02/05/16 to 03/18/16 28 patch 0  . nicotine (NICODERM CQ - DOSED IN MG/24 HR) 7 mg/24hr patch Place 1 patch (7 mg total) onto the skin daily. From 04/03/16 to 04/17/16 28 patch 0  . ALPRAZolam (XANAX) 0.5 MG tablet Take 1 tablet (0.5 mg total) by mouth 2 (two) times daily as needed for anxiety. 60 tablet 0  . traMADol (ULTRAM) 50 MG tablet Take one tablet by mouth every 4 hours as needed for pain 180 tablet 0    Allergies:   Review of patient's allergies indicates no known allergies.    Social History:  The patient  reports that he has been smoking Cigarettes.  He has been smoking about 1.00 pack per day. He has never used smokeless tobacco. He reports that he does not drink alcohol George use drugs.   Family History:  The patient's family history includes Aneurysm in his father; Breast cancer in his sister; Cancer in his mother and other; Heart failure in his mother; Hypertension in his father; Kidney disease in his mother.    ROS:  General:no colds [facility] fevers, + weight changes with hospitalization Skin:no rashes George ulcers HEENT:no blurred vision, no congestion CV:see HPI PUL:see HPI GI:no diarrhea constipation George melena, no indigestion GU:no hematuria, no dysuria MS:no joint pain, no claudication Neuro:no syncope, no lightheadedness Endo:no diabetes, no thyroid disease  Wt Readings from Last 3 Encounters:  02/11/16 167 lb 1.9  oz (75.8 kg)  02/04/16 168 lb 3.2 oz (76.3 kg)  11/02/14 185 lb (83.9 kg)     PHYSICAL EXAM: VS:  BP 100/70 (BP Location: Left Arm, Patient Position: Sitting, Cuff Size: Normal)   Pulse 61   Ht 6\' 1"  (1.854 m)   Wt 167 lb 1.9 oz (75.8 kg)   BMI 22.05 kg/m  , BMI Body mass index is 22.05 kg/m. General:Pleasant affect, NAD Skin:Warm and dry, brisk capillary refill HEENT:normocephalic, sclera clear, mucus membranes moist Neck:supple,  no JVD, no bruits  Heart:S1S2 RRR without murmur, gallup, rub George click Lungs:clear without rales, rhonchi, George wheezes NUU:VOZDAbd:soft, non tender, + BS, do not palpate liver spleen George masses Ext:no lower ext edema, 2+ pedal pulses, 2+ radial pulses Neuro:alert and oriented, MAE, follows commands, + facial symmetry    EKG:  EKG is ordered today. The ekg ordered today demonstrates SR 61 T wave inversion II,III, AVF loss of R wave AVL. Overall improved from George.    Recent Labs: 01/26/2016: Magnesium 2.6 02/01/2016: ALT 58 02/03/2016: Hemoglobin 10.1; Platelets 541 02/08/2016: BUN 23; Creatinine, Ser 1.48; Potassium 4.4; Sodium 133    Lipid Panel    Component Value Date/Time   CHOL 140 01/25/2016 0237   TRIG 113 01/25/2016 0237   HDL 32 (L) 01/25/2016 0237   CHOLHDL 4.4 01/25/2016 0237   VLDL 23 01/25/2016 0237   LDLCALC 85 01/25/2016 0237       Other studies Reviewed: Additional studies/ [facility] that were reviewed today include: . ECHO 01/24/16  Study Conclusions  - Left ventricle: The cavity size was normal. Wall thickness was   normal. Systolic function was normal. The estimated ejection   fraction was in the range of 55% to 60%. Doppler parameters are   consistent with abnormal left ventricular relaxation (grade 1   diastolic dysfunction). - Left atrium: The atrium was mildly dilated.  Carotid dopplers 01/24/16 Summary: Bilateral: intimal wall thickening CCA. Mild mixed plaque origin ICA. 1-39% ICA plaquing. Vertebral artery flow is  antegrade.  01/23/16 Procedures   Left Heart Cath and Coronary Angiography  Conclusion     1st Diag lesion, 90 %stenosed.  Ost 2nd Diag lesion, 90 %stenosed.  Mid LAD lesion, 85 %stenosed.  Prox LAD lesion, 80 %stenosed.  Ramus lesion, 60 %stenosed.  Prox Cx to Mid Cx lesion, 50 %stenosed.  Prox RCA lesion, 80 %stenosed.  The left ventricular ejection fraction is 55-65% by visual estimate.  The left ventricular systolic function is normal.  LV end diastolic pressure is normal.    Aborted inferior ST elevation myocardial infarction with recanalization of the proximal RCA to a thrombus containing 85% segmental stenosis. TIMI grade 3 flow is noted. Based upon symptoms and the duration of occlusion was less than 45 minutes.  Severe diffuse proximal to mid LAD 80-90% stenosis in severity. This segment of the LAD forms bifurcation lesions with 2 diagonal branches each of which are large. The first diagonal contains segmental 85% stenosis in the proximal segment. The second diagonal contains ostial 90% stenosis.  50% stenosis in the ostial ramus and 50% stenosis in the mid circumflex.  Normal left ventricular systolic function with EF 65% with normal LVEDP.  RECOMMENDATIONS:   IV heparin  High intensity statin therapy  Smoking cessation  IV nitroglycerin  Aspirin  A single bolus of Aggrastat was given  TCTS evaluation for consideration of multivessel CABG.     ASSESSMENT AND PLAN:  1.  STEMI with recannulization in [facility] troponin pk 1.15, on lipitor 80 mg daily  2.  CAD s/p CABG X 4 01/24/16- incision healing well. No edema, euvolemic to see TCTS 02/29/16  3. Post op a fib. Placed on IV amiodarone then switched to po.  He converted to SR , plan if recurrent a fib to begin coumadin.  He was not therapeutic on admit. With d/c on the 21st pt on amio 200 mg BID  Will decrease to 200 mg daily with plan to stop in 6 weeks.  4. Hyperlipidemia continue  statin  5.   CKD 3-check labs today  6. Elevated LFTs recheck on amiodarone and TSH.    7. Anxiety insomnia  Ordered xanax  0.5 1-2 prn BID but if he needs anymore he needs to get from PCP  8. Surgical pain, he did not get ultram  Will refill for 30 pills and no more from [place].    9. Tobacco use has stopped but very anxious.  He did take 1 puff today, xanax should help and rewrote for pt to receive nicoderm.   Will send d/c summary and today's note to PCP.      Current medicines are reviewed with the patient today.  The patient Has no concerns regarding medicines.  The following changes have been made:  See above Labs/ tests ordered today include:see above  Disposition:   FU:  see above  Signed, Nada Boozer, NP  02/11/2016 10:47 AM    [facility] [address] [place], [place], [place]  [address]/ [address] [facility] [address] [place], [place] Phone: [phone]; Fax: [phone]  [phone]

## 2016-02-11 ENCOUNTER — Telehealth: Payer: Self-pay | Admitting: *Deleted

## 2016-02-11 ENCOUNTER — Other Ambulatory Visit: Payer: Self-pay | Admitting: *Deleted

## 2016-02-11 ENCOUNTER — Encounter: Payer: Self-pay | Admitting: Cardiology

## 2016-02-11 ENCOUNTER — Ambulatory Visit (INDEPENDENT_AMBULATORY_CARE_PROVIDER_SITE_OTHER): Payer: Medicare Other | Admitting: Cardiology

## 2016-02-11 VITALS — BP 100/70 | HR 61 | Ht 73.0 in | Wt 167.1 lb

## 2016-02-11 DIAGNOSIS — Z951 Presence of aortocoronary bypass graft: Secondary | ICD-10-CM

## 2016-02-11 DIAGNOSIS — I213 ST elevation (STEMI) myocardial infarction of unspecified site: Secondary | ICD-10-CM

## 2016-02-11 DIAGNOSIS — I219 Acute myocardial infarction, unspecified: Secondary | ICD-10-CM

## 2016-02-11 DIAGNOSIS — E785 Hyperlipidemia, unspecified: Secondary | ICD-10-CM

## 2016-02-11 DIAGNOSIS — Z79899 Other long term (current) drug therapy: Secondary | ICD-10-CM | POA: Diagnosis not present

## 2016-02-11 DIAGNOSIS — I480 Paroxysmal atrial fibrillation: Secondary | ICD-10-CM

## 2016-02-11 DIAGNOSIS — R0602 Shortness of breath: Secondary | ICD-10-CM

## 2016-02-11 LAB — BASIC METABOLIC PANEL
BUN: 23 mg/dL (ref 7–25)
CALCIUM: 8.9 mg/dL (ref 8.6–10.3)
CO2: 20 mmol/L (ref 20–31)
Chloride: 103 mmol/L (ref 98–110)
Creat: 1.52 mg/dL — ABNORMAL HIGH (ref 0.70–1.25)
GLUCOSE: 84 mg/dL (ref 65–99)
Potassium: 4.7 mmol/L (ref 3.5–5.3)
SODIUM: 135 mmol/L (ref 135–146)

## 2016-02-11 LAB — CBC WITH DIFFERENTIAL/PLATELET
BASOS ABS: 109 {cells}/uL (ref 0–200)
Basophils Relative: 1 %
EOS PCT: 5 %
Eosinophils Absolute: 545 cells/uL — ABNORMAL HIGH (ref 15–500)
HCT: 33 % — ABNORMAL LOW (ref 38.5–50.0)
Hemoglobin: 11.1 g/dL — ABNORMAL LOW (ref 13.2–17.1)
LYMPHS PCT: 18 %
Lymphs Abs: 1962 cells/uL (ref 850–3900)
MCH: 28.6 pg (ref 27.0–33.0)
MCHC: 33.6 g/dL (ref 32.0–36.0)
MCV: 85.1 fL (ref 80.0–100.0)
MONOS PCT: 6 %
MPV: 9.5 fL (ref 7.5–12.5)
Monocytes Absolute: 654 cells/uL (ref 200–950)
NEUTROS PCT: 70 %
Neutro Abs: 7630 cells/uL (ref 1500–7800)
PLATELETS: 828 10*3/uL — AB (ref 140–400)
RBC: 3.88 MIL/uL — ABNORMAL LOW (ref 4.20–5.80)
RDW: 14.2 % (ref 11.0–15.0)
WBC: 10.9 10*3/uL — ABNORMAL HIGH (ref 3.8–10.8)

## 2016-02-11 MED ORDER — NICOTINE 14 MG/24HR TD PT24: 14.0000 mg | MEDICATED_PATCH | Freq: Every day | TRANSDERMAL | 0 refills | Status: DC

## 2016-02-11 MED ORDER — ALPRAZOLAM 0.5 MG PO TABS: 0.5000 mg | ORAL_TABLET | Freq: Two times a day (BID) | ORAL | 0 refills | Status: DC | PRN

## 2016-02-11 MED ORDER — TRAMADOL HCL 50 MG PO TABS: ORAL_TABLET | ORAL | 0 refills | Status: DC

## 2016-02-11 MED ORDER — ATORVASTATIN CALCIUM 80 MG PO TABS: 80.0000 mg | ORAL_TABLET | Freq: Every day | ORAL | 3 refills | Status: AC

## 2016-02-11 MED ORDER — NICOTINE 21 MG/24HR TD PT24: 21.0000 mg | MEDICATED_PATCH | Freq: Every day | TRANSDERMAL | 0 refills | Status: DC

## 2016-02-11 MED ORDER — TRAMADOL HCL 50 MG PO TABS: 50.0000 mg | ORAL_TABLET | Freq: Four times a day (QID) | ORAL | Status: DC | PRN

## 2016-02-11 MED ORDER — NICOTINE 7 MG/24HR TD PT24: 7.0000 mg | MEDICATED_PATCH | Freq: Every day | TRANSDERMAL | 0 refills | Status: DC

## 2016-02-11 MED ORDER — AMIODARONE HCL 200 MG PO TABS: 200.0000 mg | ORAL_TABLET | Freq: Two times a day (BID) | ORAL | 3 refills | Status: DC

## 2016-02-11 MED ORDER — METOPROLOL TARTRATE 25 MG PO TABS: 12.5000 mg | ORAL_TABLET | Freq: Two times a day (BID) | ORAL | 3 refills | Status: DC

## 2016-02-11 MED ORDER — TRAMADOL HCL 50 MG PO TABS: 50.0000 mg | ORAL_TABLET | Freq: Four times a day (QID) | ORAL | 0 refills | Status: DC | PRN

## 2016-02-11 NOTE — Patient Instructions (Addendum)
Your physician recommends that you continue on your current medications as directed. Please refer to the Current Medication list given to you today.  Your physician recommends that you return for lab work in:  TODAY  BMET  AND  CBC Your physician recommends that you schedule a follow-up appointment in:  3 MONTHS  WITH DR  Katrinka Blazing

## 2016-02-11 NOTE — Telephone Encounter (Signed)
[facility] #[phone] Fax: [phone]   

## 2016-02-11 NOTE — Telephone Encounter (Signed)
PT AWARE TO DECREASE AMIODARONE TO  1 TAB  DAILY ./CY

## 2016-02-11 NOTE — Telephone Encounter (Signed)
LM  FOR PT  TO CALL  BACK MAY  DECREASE  AMIODARONE  TO 1  TAB DAILY  PER LAURA INGOLD NP .Zack Seal

## 2016-02-12 ENCOUNTER — Telehealth: Payer: Self-pay | Admitting: Interventional Cardiology

## 2016-02-12 DIAGNOSIS — Z48812 Encounter for surgical aftercare following surgery on the circulatory system: Secondary | ICD-10-CM | POA: Diagnosis not present

## 2016-02-12 DIAGNOSIS — I482 Chronic atrial fibrillation: Secondary | ICD-10-CM | POA: Diagnosis not present

## 2016-02-12 DIAGNOSIS — N183 Chronic kidney disease, stage 3 (moderate): Secondary | ICD-10-CM | POA: Diagnosis not present

## 2016-02-12 DIAGNOSIS — I2510 Atherosclerotic heart disease of native coronary artery without angina pectoris: Secondary | ICD-10-CM | POA: Diagnosis not present

## 2016-02-12 DIAGNOSIS — J449 Chronic obstructive pulmonary disease, unspecified: Secondary | ICD-10-CM | POA: Diagnosis not present

## 2016-02-12 DIAGNOSIS — I129 Hypertensive chronic kidney disease with stage 1 through stage 4 chronic kidney disease, or unspecified chronic kidney disease: Secondary | ICD-10-CM | POA: Diagnosis not present

## 2016-02-12 MED ORDER — AMIODARONE HCL 200 MG PO TABS: 200.0000 mg | ORAL_TABLET | Freq: Every day | ORAL | 3 refills | Status: DC

## 2016-02-12 NOTE — Telephone Encounter (Signed)
Spoke with [facility] at [facility] and let her know Dr. Katrinka Blazing said ok to do Social Work consult and updated her on medications.   Spoke with pt to go over medications and instructions.  Advised about Amiodarone 200mg  once daily and reminded pt he needed to be on ASA 325mg , not 81mg .  Pt verbalized understanding and was in agreement with this plan.

## 2016-02-12 NOTE — Telephone Encounter (Signed)
He should be taking the following medications:  TAKE these medications   amiodarone 200 MG tablet Commonly known as:  PACERONE Take 1 tablet (200 mg total) by mouth 2 (two) times daily.  aspirin 325 MG EC tablet Take 1 tablet (325 mg total) by mouth daily. Start taking on:  02/05/2016  atorvastatin 80 MG tablet Commonly known as:  LIPITOR Take 1 tablet (80 mg total) by mouth daily at 6 PM.  furosemide 40 MG tablet Commonly known as:  LASIX Take 1 tablet (40 mg total) by mouth daily. Start taking on:  02/05/2016  levalbuterol 1.25 MG/0.5ML nebulizer solution Commonly known as:  XOPENEX Take 1.25 mg by nebulization every 6 (six) hours as needed for wheezing or shortness of breath.  metoprolol tartrate 25 MG tablet Commonly known as:  LOPRESSOR Take 0.5 tablets (12.5 mg total) by mouth 2 (two) times daily.  mometasone-formoterol 200-5 MCG/ACT Aero Commonly known as:  DULERA Inhale 2 puffs into the lungs 2 (two) times daily.  potassium chloride SA 20 MEQ tablet Commonly known as:  K-DUR,KLOR-CON Take 1 tablet (20 mEq total) by mouth daily. Start taking on:  02/05/2016  thiamine 100 MG tablet Take 1 tablet (100 mg total) by mouth daily.  traMADol 50 MG tablet Commonly known as:  ULTRAM Take 1 tablet (50 mg total   The am he can be reduced to 1 tablet daily. The aspirin should be 325 mg daily. Atorvastatin is not mandatory at this time but he will need to start taking it once he gets it from the [place].   It is okay to do the social work consult

## 2016-02-12 NOTE — Telephone Encounter (Signed)
Spoke with Byrd Hesselbach from [facility].  She called to verify some of pt's meds as he has not been taking some of them.  Pt does not have any K+ or Thiamine at this time.  Pt told Byrd Hesselbach that meds were sent into [place] and he will get them next Thurs and refuses to go to a local pharmacy to pick up a short supply to last him until then.  Pt is also taking ASA 81mg  instead of 325mg . Byrd Hesselbach states pt has some financial problems so they would like to do a Medical Social Work Animal nutritionist for [employer] if Dr.Smith would approve.  Advised I will send message to Dr. Katrinka Blazing for review.  Byrd Hesselbach very appreciative for assistance.

## 2016-02-12 NOTE — Telephone Encounter (Signed)
New message      Pt recently left rehab.  Calling to go over medications.  There are some medications on his list that pt does not have. Please call

## 2016-02-15 DIAGNOSIS — I482 Chronic atrial fibrillation: Secondary | ICD-10-CM | POA: Diagnosis not present

## 2016-02-15 DIAGNOSIS — Z48812 Encounter for surgical aftercare following surgery on the circulatory system: Secondary | ICD-10-CM | POA: Diagnosis not present

## 2016-02-15 DIAGNOSIS — I2510 Atherosclerotic heart disease of native coronary artery without angina pectoris: Secondary | ICD-10-CM | POA: Diagnosis not present

## 2016-02-15 DIAGNOSIS — I129 Hypertensive chronic kidney disease with stage 1 through stage 4 chronic kidney disease, or unspecified chronic kidney disease: Secondary | ICD-10-CM | POA: Diagnosis not present

## 2016-02-15 DIAGNOSIS — N183 Chronic kidney disease, stage 3 (moderate): Secondary | ICD-10-CM | POA: Diagnosis not present

## 2016-02-15 DIAGNOSIS — J449 Chronic obstructive pulmonary disease, unspecified: Secondary | ICD-10-CM | POA: Diagnosis not present

## 2016-02-16 DIAGNOSIS — N183 Chronic kidney disease, stage 3 (moderate): Secondary | ICD-10-CM | POA: Diagnosis not present

## 2016-02-16 DIAGNOSIS — Z48812 Encounter for surgical aftercare following surgery on the circulatory system: Secondary | ICD-10-CM | POA: Diagnosis not present

## 2016-02-16 DIAGNOSIS — I129 Hypertensive chronic kidney disease with stage 1 through stage 4 chronic kidney disease, or unspecified chronic kidney disease: Secondary | ICD-10-CM | POA: Diagnosis not present

## 2016-02-16 DIAGNOSIS — I482 Chronic atrial fibrillation: Secondary | ICD-10-CM | POA: Diagnosis not present

## 2016-02-16 DIAGNOSIS — J449 Chronic obstructive pulmonary disease, unspecified: Secondary | ICD-10-CM | POA: Diagnosis not present

## 2016-02-16 DIAGNOSIS — I2510 Atherosclerotic heart disease of native coronary artery without angina pectoris: Secondary | ICD-10-CM | POA: Diagnosis not present

## 2016-02-17 DIAGNOSIS — J449 Chronic obstructive pulmonary disease, unspecified: Secondary | ICD-10-CM | POA: Diagnosis not present

## 2016-02-17 DIAGNOSIS — I482 Chronic atrial fibrillation: Secondary | ICD-10-CM | POA: Diagnosis not present

## 2016-02-17 DIAGNOSIS — I129 Hypertensive chronic kidney disease with stage 1 through stage 4 chronic kidney disease, or unspecified chronic kidney disease: Secondary | ICD-10-CM | POA: Diagnosis not present

## 2016-02-17 DIAGNOSIS — Z48812 Encounter for surgical aftercare following surgery on the circulatory system: Secondary | ICD-10-CM | POA: Diagnosis not present

## 2016-02-17 DIAGNOSIS — I2510 Atherosclerotic heart disease of native coronary artery without angina pectoris: Secondary | ICD-10-CM | POA: Diagnosis not present

## 2016-02-17 DIAGNOSIS — N183 Chronic kidney disease, stage 3 (moderate): Secondary | ICD-10-CM | POA: Diagnosis not present

## 2016-02-19 DIAGNOSIS — N183 Chronic kidney disease, stage 3 (moderate): Secondary | ICD-10-CM | POA: Diagnosis not present

## 2016-02-19 DIAGNOSIS — Z48812 Encounter for surgical aftercare following surgery on the circulatory system: Secondary | ICD-10-CM | POA: Diagnosis not present

## 2016-02-19 DIAGNOSIS — J449 Chronic obstructive pulmonary disease, unspecified: Secondary | ICD-10-CM | POA: Diagnosis not present

## 2016-02-19 DIAGNOSIS — I2510 Atherosclerotic heart disease of native coronary artery without angina pectoris: Secondary | ICD-10-CM | POA: Diagnosis not present

## 2016-02-19 DIAGNOSIS — I482 Chronic atrial fibrillation: Secondary | ICD-10-CM | POA: Diagnosis not present

## 2016-02-19 DIAGNOSIS — I129 Hypertensive chronic kidney disease with stage 1 through stage 4 chronic kidney disease, or unspecified chronic kidney disease: Secondary | ICD-10-CM | POA: Diagnosis not present

## 2016-02-23 DIAGNOSIS — Z48812 Encounter for surgical aftercare following surgery on the circulatory system: Secondary | ICD-10-CM | POA: Diagnosis not present

## 2016-02-23 DIAGNOSIS — I2510 Atherosclerotic heart disease of native coronary artery without angina pectoris: Secondary | ICD-10-CM | POA: Diagnosis not present

## 2016-02-23 DIAGNOSIS — I129 Hypertensive chronic kidney disease with stage 1 through stage 4 chronic kidney disease, or unspecified chronic kidney disease: Secondary | ICD-10-CM | POA: Diagnosis not present

## 2016-02-23 DIAGNOSIS — N183 Chronic kidney disease, stage 3 (moderate): Secondary | ICD-10-CM | POA: Diagnosis not present

## 2016-02-23 DIAGNOSIS — I482 Chronic atrial fibrillation: Secondary | ICD-10-CM | POA: Diagnosis not present

## 2016-02-23 DIAGNOSIS — J449 Chronic obstructive pulmonary disease, unspecified: Secondary | ICD-10-CM | POA: Diagnosis not present

## 2016-02-24 DIAGNOSIS — I2510 Atherosclerotic heart disease of native coronary artery without angina pectoris: Secondary | ICD-10-CM | POA: Diagnosis not present

## 2016-02-24 DIAGNOSIS — I129 Hypertensive chronic kidney disease with stage 1 through stage 4 chronic kidney disease, or unspecified chronic kidney disease: Secondary | ICD-10-CM | POA: Diagnosis not present

## 2016-02-24 DIAGNOSIS — Z48812 Encounter for surgical aftercare following surgery on the circulatory system: Secondary | ICD-10-CM | POA: Diagnosis not present

## 2016-02-24 DIAGNOSIS — J449 Chronic obstructive pulmonary disease, unspecified: Secondary | ICD-10-CM | POA: Diagnosis not present

## 2016-02-24 DIAGNOSIS — N183 Chronic kidney disease, stage 3 (moderate): Secondary | ICD-10-CM | POA: Diagnosis not present

## 2016-02-24 DIAGNOSIS — I482 Chronic atrial fibrillation: Secondary | ICD-10-CM | POA: Diagnosis not present

## 2016-02-25 ENCOUNTER — Other Ambulatory Visit: Payer: Self-pay | Admitting: Cardiothoracic Surgery

## 2016-02-25 DIAGNOSIS — Z951 Presence of aortocoronary bypass graft: Secondary | ICD-10-CM

## 2016-02-29 ENCOUNTER — Ambulatory Visit
Admission: RE | Admit: 2016-02-29 | Discharge: 2016-02-29 | Disposition: A | Discharge status: Home / Self Care | Payer: Medicare Other | Source: Ambulatory Visit | Attending: Cardiothoracic Surgery | Admitting: Cardiothoracic Surgery

## 2016-02-29 ENCOUNTER — Ambulatory Visit (INDEPENDENT_AMBULATORY_CARE_PROVIDER_SITE_OTHER): Payer: Self-pay | Admitting: Physician Assistant

## 2016-02-29 VITALS — BP 155/83 | HR 59 | Resp 16 | Ht 73.0 in | Wt 179.0 lb

## 2016-02-29 DIAGNOSIS — Z951 Presence of aortocoronary bypass graft: Secondary | ICD-10-CM

## 2016-02-29 DIAGNOSIS — J90 Pleural effusion, not elsewhere classified: Secondary | ICD-10-CM | POA: Diagnosis not present

## 2016-02-29 DIAGNOSIS — I2510 Atherosclerotic heart disease of native coronary artery without angina pectoris: Secondary | ICD-10-CM

## 2016-02-29 NOTE — Progress Notes (Signed)
HPI: Patient returns for routine postoperative follow-up having undergone CABG x 4  on 01/25/2016. The patient's early postoperative recovery while in the hospital was notable for suspected pneumonia treated with ABX. He had post operative delirium.  He was discharged to SNF for further rehabilitation.  Since hospital discharge the patient reports he is doing well for the most part.  He states he doesn't remember anything about his hospitalization.  He is very fearful of everything and is concerned something could happen to his heart again.  He has been seen by both Cardiology and Nada BoozerLaura Ingold whom gave the patient anxiety medication.  The patient denies chest pain, shortness of breath.  He is ambulating without difficulty and states he has made good progress since discharge home.  He is no longer experiencing confusion.  Current Outpatient Prescriptions  Medication Sig Dispense Refill  . ALPRAZolam (XANAX) 0.5 MG tablet Take 1 tablet (0.5 mg total) by mouth 2 (two) times daily as needed for anxiety. 60 tablet 0  . amiodarone (PACERONE) 200 MG tablet Take 1 tablet (200 mg total) by mouth daily. 90 tablet 3  . aspirin EC 325 MG EC tablet Take 1 tablet (325 mg total) by mouth daily. 30 tablet 0  . atorvastatin (LIPITOR) 80 MG tablet Take 1 tablet (80 mg total) by mouth daily at 6 PM. 90 tablet 3  . levalbuterol (XOPENEX) 1.25 MG/0.5ML nebulizer solution Take 1.25 mg by nebulization every 6 (six) hours as needed for wheezing or shortness of breath. 1 each 12  . metoprolol tartrate (LOPRESSOR) 25 MG tablet Take 0.5 tablets (12.5 mg total) by mouth 2 (two) times daily. 90 tablet 3  . mometasone-formoterol (DULERA) 200-5 MCG/ACT AERO Inhale 2 puffs into the lungs 2 (two) times daily. 1 Inhaler 12  . nicotine (NICODERM CQ - DOSED IN MG/24 HOURS) 21 mg/24hr patch Place 1 patch (21 mg total) onto the skin daily. From 02/05/16 to 03/18/16 28 patch 0  . nicotine (NICODERM CQ - DOSED IN MG/24 HOURS) 14 mg/24hr patch  Place 1 patch (14 mg total) onto the skin daily. From 11/4/17until 04/02/16 (Patient not taking: Reported on 02/29/2016) 28 patch 0  . nicotine (NICODERM CQ - DOSED IN MG/24 HR) 7 mg/24hr patch Place 1 patch (7 mg total) onto the skin daily. From 04/03/16 to 04/17/16 (Patient not taking: Reported on 02/29/2016) 28 patch 0  . potassium chloride SA (K-DUR,KLOR-CON) 20 MEQ tablet Take 1 tablet (20 mEq total) by mouth daily. (Patient not taking: Reported on 02/29/2016) 3 tablet 0  . thiamine 100 MG tablet Take 1 tablet (100 mg total) by mouth daily. (Patient not taking: Reported on 02/29/2016) 30 tablet 1  . traMADol (ULTRAM) 50 MG tablet Take one tablet by mouth every 4 hours as needed for pain (Patient not taking: Reported on 02/29/2016) 180 tablet 0   Current Facility-Administered Medications  Medication Dose Route Frequency Provider Last Rate Last Dose  . traMADol (ULTRAM) tablet 50 mg  50 mg Oral Q6H PRN Leone BrandLaura R Ingold, NP        Physical Exam:  BP (!) 155/83   Pulse (!) 59   Resp 16   Ht 6\' 1"  (1.854 m)   Wt 179 lb (81.2 kg)   SpO2 98% Comment: ON RA  BMI 23.62 kg/m   Gen: no apparent distress Heart: RRR Lungs: CTA  Abd: soft non-tender, non-distended Ext: no edema Incisions: well healed  Diagnostic Tests:  CXR: resolving small pleural effusion, normal post surgical changes  A/P  1. S/p CABG x 4- doing very well.  He has some HTN at visit today and his HR is a little slow at 59.  He remains asymptomatic.  [facility] and I will defer further adjustment to BP medication as needed,, patient is very anxious at appointment today which could contribute to erroneous elevation 2. H/O PAF... Not on coumadin due to medication non-compliance 3. Dispo- RTC prn, [facility] as scheduled  Lowella Dandy, PA-C [facility] [phone]

## 2016-03-01 DIAGNOSIS — J449 Chronic obstructive pulmonary disease, unspecified: Secondary | ICD-10-CM | POA: Diagnosis not present

## 2016-03-01 DIAGNOSIS — N183 Chronic kidney disease, stage 3 (moderate): Secondary | ICD-10-CM | POA: Diagnosis not present

## 2016-03-01 DIAGNOSIS — I482 Chronic atrial fibrillation: Secondary | ICD-10-CM | POA: Diagnosis not present

## 2016-03-01 DIAGNOSIS — I2510 Atherosclerotic heart disease of native coronary artery without angina pectoris: Secondary | ICD-10-CM | POA: Diagnosis not present

## 2016-03-01 DIAGNOSIS — I129 Hypertensive chronic kidney disease with stage 1 through stage 4 chronic kidney disease, or unspecified chronic kidney disease: Secondary | ICD-10-CM | POA: Diagnosis not present

## 2016-03-01 DIAGNOSIS — Z48812 Encounter for surgical aftercare following surgery on the circulatory system: Secondary | ICD-10-CM | POA: Diagnosis not present

## 2016-03-02 ENCOUNTER — Ambulatory Visit: Payer: Non-veteran care | Admitting: Cardiothoracic Surgery

## 2016-03-07 DIAGNOSIS — I482 Chronic atrial fibrillation: Secondary | ICD-10-CM | POA: Diagnosis not present

## 2016-03-07 DIAGNOSIS — I2510 Atherosclerotic heart disease of native coronary artery without angina pectoris: Secondary | ICD-10-CM | POA: Diagnosis not present

## 2016-03-07 DIAGNOSIS — N183 Chronic kidney disease, stage 3 (moderate): Secondary | ICD-10-CM | POA: Diagnosis not present

## 2016-03-07 DIAGNOSIS — I129 Hypertensive chronic kidney disease with stage 1 through stage 4 chronic kidney disease, or unspecified chronic kidney disease: Secondary | ICD-10-CM | POA: Diagnosis not present

## 2016-03-07 DIAGNOSIS — Z48812 Encounter for surgical aftercare following surgery on the circulatory system: Secondary | ICD-10-CM | POA: Diagnosis not present

## 2016-03-07 DIAGNOSIS — J449 Chronic obstructive pulmonary disease, unspecified: Secondary | ICD-10-CM | POA: Diagnosis not present

## 2016-03-08 DIAGNOSIS — Z48812 Encounter for surgical aftercare following surgery on the circulatory system: Secondary | ICD-10-CM | POA: Diagnosis not present

## 2016-03-08 DIAGNOSIS — I482 Chronic atrial fibrillation: Secondary | ICD-10-CM | POA: Diagnosis not present

## 2016-03-08 DIAGNOSIS — J449 Chronic obstructive pulmonary disease, unspecified: Secondary | ICD-10-CM | POA: Diagnosis not present

## 2016-03-08 DIAGNOSIS — I129 Hypertensive chronic kidney disease with stage 1 through stage 4 chronic kidney disease, or unspecified chronic kidney disease: Secondary | ICD-10-CM | POA: Diagnosis not present

## 2016-03-08 DIAGNOSIS — N183 Chronic kidney disease, stage 3 (moderate): Secondary | ICD-10-CM | POA: Diagnosis not present

## 2016-03-08 DIAGNOSIS — I2510 Atherosclerotic heart disease of native coronary artery without angina pectoris: Secondary | ICD-10-CM | POA: Diagnosis not present

## 2016-03-14 DIAGNOSIS — J449 Chronic obstructive pulmonary disease, unspecified: Secondary | ICD-10-CM | POA: Diagnosis not present

## 2016-03-14 DIAGNOSIS — N183 Chronic kidney disease, stage 3 (moderate): Secondary | ICD-10-CM | POA: Diagnosis not present

## 2016-03-14 DIAGNOSIS — I482 Chronic atrial fibrillation: Secondary | ICD-10-CM | POA: Diagnosis not present

## 2016-03-14 DIAGNOSIS — Z48812 Encounter for surgical aftercare following surgery on the circulatory system: Secondary | ICD-10-CM | POA: Diagnosis not present

## 2016-03-14 DIAGNOSIS — I129 Hypertensive chronic kidney disease with stage 1 through stage 4 chronic kidney disease, or unspecified chronic kidney disease: Secondary | ICD-10-CM | POA: Diagnosis not present

## 2016-03-14 DIAGNOSIS — I2510 Atherosclerotic heart disease of native coronary artery without angina pectoris: Secondary | ICD-10-CM | POA: Diagnosis not present

## 2016-03-15 DIAGNOSIS — N183 Chronic kidney disease, stage 3 (moderate): Secondary | ICD-10-CM | POA: Diagnosis not present

## 2016-03-15 DIAGNOSIS — I2510 Atherosclerotic heart disease of native coronary artery without angina pectoris: Secondary | ICD-10-CM | POA: Diagnosis not present

## 2016-03-15 DIAGNOSIS — I482 Chronic atrial fibrillation: Secondary | ICD-10-CM | POA: Diagnosis not present

## 2016-03-15 DIAGNOSIS — I129 Hypertensive chronic kidney disease with stage 1 through stage 4 chronic kidney disease, or unspecified chronic kidney disease: Secondary | ICD-10-CM | POA: Diagnosis not present

## 2016-03-15 DIAGNOSIS — J449 Chronic obstructive pulmonary disease, unspecified: Secondary | ICD-10-CM | POA: Diagnosis not present

## 2016-03-15 DIAGNOSIS — Z48812 Encounter for surgical aftercare following surgery on the circulatory system: Secondary | ICD-10-CM | POA: Diagnosis not present

## 2016-03-21 ENCOUNTER — Telehealth: Payer: Self-pay | Admitting: Interventional Cardiology

## 2016-03-21 DIAGNOSIS — I129 Hypertensive chronic kidney disease with stage 1 through stage 4 chronic kidney disease, or unspecified chronic kidney disease: Secondary | ICD-10-CM | POA: Diagnosis not present

## 2016-03-21 DIAGNOSIS — I2510 Atherosclerotic heart disease of native coronary artery without angina pectoris: Secondary | ICD-10-CM | POA: Diagnosis not present

## 2016-03-21 DIAGNOSIS — Z48812 Encounter for surgical aftercare following surgery on the circulatory system: Secondary | ICD-10-CM | POA: Diagnosis not present

## 2016-03-21 DIAGNOSIS — J449 Chronic obstructive pulmonary disease, unspecified: Secondary | ICD-10-CM | POA: Diagnosis not present

## 2016-03-21 DIAGNOSIS — N183 Chronic kidney disease, stage 3 (moderate): Secondary | ICD-10-CM | POA: Diagnosis not present

## 2016-03-21 DIAGNOSIS — I482 Chronic atrial fibrillation: Secondary | ICD-10-CM | POA: Diagnosis not present

## 2016-03-21 NOTE — Telephone Encounter (Signed)
Please advise 

## 2016-03-21 NOTE — Telephone Encounter (Signed)
[facility]) states pt's inhaler came in the mail after her phone call to [place] this morning.  She states he is "squared away".

## 2016-03-21 NOTE — Telephone Encounter (Signed)
New Message  Hilda Lias from [facility] call requesting to speak with RN. Hilda Lias states [facility] but was unable to contact the [facility] for refill. Hilda Lias states it is an Emergency. Please call back to discuss    *STAT* If patient is at the pharmacy, call can be transferred to refill team.   1. Which medications need to be refilled? (please list name of each medication and dose if known) Mometasone 200-5 MCG  2. Which pharmacy/location (including street and city if local pharmacy) is medication to be sent to? [facility] in [place]  3. Do they need a 30 day or 90 day supply? Inhaler

## 2016-03-22 ENCOUNTER — Other Ambulatory Visit: Payer: Self-pay

## 2016-03-23 NOTE — Telephone Encounter (Signed)
Needs to come from PCP.  Looks like Vernona Rieger advised him of this when she seen him so he should be aware. Thanks!!

## 2016-03-31 ENCOUNTER — Other Ambulatory Visit: Payer: Self-pay | Admitting: *Deleted

## 2016-03-31 MED ORDER — AMIODARONE HCL 200 MG PO TABS: 200.0000 mg | ORAL_TABLET | Freq: Every day | ORAL | 9 refills | Status: DC

## 2016-04-06 DIAGNOSIS — Z48812 Encounter for surgical aftercare following surgery on the circulatory system: Secondary | ICD-10-CM | POA: Diagnosis not present

## 2016-04-06 DIAGNOSIS — I482 Chronic atrial fibrillation: Secondary | ICD-10-CM | POA: Diagnosis not present

## 2016-04-06 DIAGNOSIS — N183 Chronic kidney disease, stage 3 (moderate): Secondary | ICD-10-CM | POA: Diagnosis not present

## 2016-04-06 DIAGNOSIS — I2510 Atherosclerotic heart disease of native coronary artery without angina pectoris: Secondary | ICD-10-CM | POA: Diagnosis not present

## 2016-04-06 DIAGNOSIS — J449 Chronic obstructive pulmonary disease, unspecified: Secondary | ICD-10-CM | POA: Diagnosis not present

## 2016-04-06 DIAGNOSIS — I129 Hypertensive chronic kidney disease with stage 1 through stage 4 chronic kidney disease, or unspecified chronic kidney disease: Secondary | ICD-10-CM | POA: Diagnosis not present

## 2016-06-07 ENCOUNTER — Ambulatory Visit (INDEPENDENT_AMBULATORY_CARE_PROVIDER_SITE_OTHER): Payer: Medicare Other | Admitting: Interventional Cardiology

## 2016-06-07 ENCOUNTER — Encounter: Payer: Self-pay | Admitting: Interventional Cardiology

## 2016-06-07 VITALS — BP 164/92 | HR 54 | Ht 73.0 in | Wt 182.2 lb

## 2016-06-07 DIAGNOSIS — I10 Essential (primary) hypertension: Secondary | ICD-10-CM

## 2016-06-07 DIAGNOSIS — I480 Paroxysmal atrial fibrillation: Secondary | ICD-10-CM

## 2016-06-07 DIAGNOSIS — Z7901 Long term (current) use of anticoagulants: Secondary | ICD-10-CM

## 2016-06-07 DIAGNOSIS — Z720 Tobacco use: Secondary | ICD-10-CM

## 2016-06-07 DIAGNOSIS — I25810 Atherosclerosis of coronary artery bypass graft(s) without angina pectoris: Secondary | ICD-10-CM | POA: Diagnosis not present

## 2016-06-07 NOTE — Patient Instructions (Signed)
Medication Instructions:  1) DISCONTINUE Amiodarone if you are still taking it.   Labwork: None  Testing/Procedures: None  Follow-Up: Your physician wants you to follow-up in: 6 months with Dr. Katrinka Blazing. You will receive a reminder letter in the mail two months in advance. If you don't receive a letter, please call our office to schedule the follow-up appointment.  If you get established with a Cardiologist at the [facility] hospital prior to your 6 month follow up, please call and cancel your appointment with Dr. Katrinka Blazing.   Any Other Special Instructions Will Be Listed Below (If Applicable).     If you need a refill on your cardiac medications before your next appointment, please call your pharmacy.

## 2016-06-07 NOTE — Progress Notes (Signed)
Cardiology Office Note    Date:  06/07/2016   ID:  Darryl George, DOB [birth year], MRN [id number]  PCP:  PROVIDER NOT IN SYSTEM  Cardiologist: Lesleigh Noe, MD   Chief Complaint  Patient presents with  . Coronary Artery Disease    History of Present Illness:  Darryl George is a 66 y.o. male who presents for post hospitalization for STEMI and recannulization of pRCA and then CABG.  He has a hx ofprior stroke on coumadin, hypertension, COPD,  tobacco use. Amiodarone was used transiently post-op control atrial fibrillation.   He plans to have subsequent follow-up at the [facility] [place]. He thinks that amiodarone has been discontinued. He denies anginal quality chest pain. He continues to smoke cigarettes. He has chronic cough.  Surgery performed in 2017: OPERATION: 1. Coronary artery bypass grafting x5 (left internal mammary artery to     LAD, saphenous vein graft to second diagonal, sequential saphenous     vein graft to ramus intermedius and obtuse marginal, saphenous vein     graft to RCA).  Past Medical History:  Diagnosis Date  . Back pain   . COPD (chronic obstructive pulmonary disease) (HCC)   . Hypertension   . Stroke [facility])    Noted on MRI scan performed at [facility]  . Tobacco abuse     Past Surgical History:  Procedure Laterality Date  . CARDIAC CATHETERIZATION N/A 01/23/2016   Procedure: Left Heart Cath and Coronary Angiography;  Surgeon: Lyn Records, MD;  Location: [facility] INVASIVE CV LAB;  Service: Cardiovascular;  Laterality: N/A;  . CORONARY ARTERY BYPASS GRAFT N/A 01/25/2016   Procedure: CORONARY ARTERY BYPASS GRAFTING (CABG) times five using left internal mammary artery and right saphenous vein.;  Surgeon: Kerin Perna, MD;  Location: [facility] OR;  Service: Open Heart Surgery;  Laterality: N/A;  . EYE SURGERY    . HERNIA REPAIR    . TEE WITHOUT CARDIOVERSION  01/25/2016   Procedure: TRANSESOPHAGEAL ECHOCARDIOGRAM (TEE);  Surgeon: Kerin Perna, MD;  Location:  [facility] OR;  Service: Open Heart Surgery;;    Current Medications: Outpatient Medications Prior to Visit  Medication Sig Dispense Refill  . ALPRAZolam (XANAX) 0.5 MG tablet Take 1 tablet (0.5 mg total) by mouth 2 (two) times daily as needed for anxiety. 60 tablet 0  . amiodarone (PACERONE) 200 MG tablet Take 1 tablet (200 mg total) by mouth daily. 30 tablet 9  . aspirin EC 325 MG EC tablet Take 1 tablet (325 mg total) by mouth daily. 30 tablet 0  . atorvastatin (LIPITOR) 80 MG tablet Take 1 tablet (80 mg total) by mouth daily at 6 PM. 90 tablet 3  . levalbuterol (XOPENEX) 1.25 MG/0.5ML nebulizer solution Take 1.25 mg by nebulization every 6 (six) hours as needed for wheezing or shortness of breath. 1 each 12  . metoprolol tartrate (LOPRESSOR) 25 MG tablet Take 0.5 tablets (12.5 mg total) by mouth 2 (two) times daily. 90 tablet 3  . mometasone-formoterol (DULERA) 200-5 MCG/ACT AERO Inhale 2 puffs into the lungs 2 (two) times daily. 1 Inhaler 12  . nicotine (NICODERM CQ - DOSED IN MG/24 HOURS) 14 mg/24hr patch Place 1 patch (14 mg total) onto the skin daily. From 11/4/17until 04/02/16 28 patch 0  . nicotine (NICODERM CQ - DOSED IN MG/24 HOURS) 21 mg/24hr patch Place 1 patch (21 mg total) onto the skin daily. From 02/05/16 to 03/18/16 28 patch 0  . nicotine (NICODERM CQ - DOSED IN  MG/24 HR) 7 mg/24hr patch Place 1 patch (7 mg total) onto the skin daily. From 04/03/16 to 04/17/16 28 patch 0  . potassium chloride SA (K-DUR,KLOR-CON) 20 MEQ tablet Take 1 tablet (20 mEq total) by mouth daily. 3 tablet 0  . thiamine 100 MG tablet Take 1 tablet (100 mg total) by mouth daily. 30 tablet 1  . traMADol (ULTRAM) 50 MG tablet Take one tablet by mouth every 4 hours as needed for pain 180 tablet 0   Facility-Administered Medications Prior to Visit  Medication Dose Route Frequency Provider Last Rate Last Dose  . traMADol (ULTRAM) tablet 50 mg  50 mg Oral Q6H PRN Leone Brand, NP         Allergies:   Patient has no  known allergies.   Social History   Social History  . Marital status: Divorced    Spouse name: N/A  . Number of children: N/A  . Years of education: N/A   Social History Main Topics  . Smoking status: Current Every Day Smoker    Packs/day: 1.00    Types: Cigarettes  . Smokeless tobacco: Never Used  . Alcohol use No     Comment: once a month  . Drug use: No  . Sexual activity: Not Asked   Other Topics Concern  . None   Social History Narrative  . None     Family History:  The patient's family history includes Aneurysm in his father; Breast cancer in his sister; Cancer in his mother and other; Heart failure in his mother; Hypertension in his father; Kidney disease in his mother.   ROS:   Please see the history of present illness.    Normal sternal healing. No drainage.  All other systems reviewed and are negative.   PHYSICAL EXAM:   VS:  BP (!) 164/92 (BP Location: Right Arm)   Pulse (!) 54   Ht 6\' 1"  (1.854 m)   Wt 182 lb 3.2 oz (82.6 kg)   BMI 24.04 kg/m    GEN: Well nourished, well developed, in no acute distress Area smells heavily of tobacco HEENT: normal  Neck: no JVD, carotid bruits, or masses Cardiac: RRR; no murmurs, rubs, or gallops,no edema  Respiratory:  clear to auscultation bilaterally, normal work of breathing GI: soft, nontender, nondistended, + BS MS: no deformity or atrophy  Skin: warm and dry, no rash Neuro:  Alert and Oriented x 3, Strength and sensation are intact Psych: euthymic mood, full affect  Wt Readings from Last 3 Encounters:  06/07/16 182 lb 3.2 oz (82.6 kg)  02/29/16 179 lb (81.2 kg)  02/11/16 167 lb 1.9 oz (75.8 kg)      Studies/Labs Reviewed:   EKG:  EKG  Not performed.  Recent Labs: 01/26/2016: Magnesium 2.6 02/01/2016: ALT 58 02/11/2016: BUN 23; Creat 1.52; Hemoglobin 11.1; Platelets 828; Potassium 4.7; Sodium 135   Lipid Panel    Component Value Date/Time   CHOL 140 01/25/2016 0237   TRIG 113 01/25/2016 0237    HDL 32 (L) 01/25/2016 0237   CHOLHDL 4.4 01/25/2016 0237   VLDL 23 01/25/2016 0237   LDLCALC 85 01/25/2016 0237    Additional studies/ records that were reviewed today include:  Cardiac catheterization 2017 Diagnostic Diagram         ASSESSMENT:    1. Paroxysmal atrial fibrillation (HCC)   2. Coronary artery disease involving coronary bypass graft of native heart without angina pectoris   3. Essential hypertension   4. Chronic anticoagulation  PLAN:  In order of problems listed above:  1. No recurrence of atrial fibrillation now off amiodarone. 2. Stable status post coronary bypass grafting. He plans to affiliate with the cardiologist and the [facility]. 3. Blood pressure is under good control on the current regimen. 4. Anticoagulation therapy is been discontinued. The patient is on one aspirin per day. 5. Encouraged to discontinue smoking and to be vigilant with risk factor modification for the rest of his life.  Overall, we plan to see him back in 6 months if he has not established with a cardiologist at the [facility] in [place]. Continue current medical regimen as listed.  Medication Adjustments/Labs and Tests Ordered: Current medicines are reviewed at length with the patient today.  Concerns regarding medicines are outlined above.  Medication changes, Labs and Tests ordered today are listed in the Patient Instructions below. There are no Patient Instructions on file for this visit.   Signed, Lesleigh Noe, MD  06/07/2016 9:11 AM    [facility] [address] [place], [place], [place]  [address] Phone: [phone]; Fax: [phone]

## 2016-07-30 ENCOUNTER — Inpatient Hospital Stay (HOSPITAL_COMMUNITY)
Admission: EM | Admit: 2016-07-30 | Discharge: 2016-07-31 | DRG: 065 | Discharge status: Against Medical Advice | Payer: Medicare Other | Attending: Family Medicine | Admitting: Family Medicine

## 2016-07-30 ENCOUNTER — Emergency Department (HOSPITAL_COMMUNITY): Payer: Medicare Other

## 2016-07-30 ENCOUNTER — Encounter (HOSPITAL_COMMUNITY): Payer: Self-pay

## 2016-07-30 DIAGNOSIS — J449 Chronic obstructive pulmonary disease, unspecified: Secondary | ICD-10-CM | POA: Diagnosis not present

## 2016-07-30 DIAGNOSIS — I63443 Cerebral infarction due to embolism of bilateral cerebellar arteries: Secondary | ICD-10-CM | POA: Diagnosis not present

## 2016-07-30 DIAGNOSIS — J410 Simple chronic bronchitis: Secondary | ICD-10-CM | POA: Diagnosis not present

## 2016-07-30 DIAGNOSIS — I639 Cerebral infarction, unspecified: Secondary | ICD-10-CM | POA: Diagnosis not present

## 2016-07-30 DIAGNOSIS — I480 Paroxysmal atrial fibrillation: Secondary | ICD-10-CM | POA: Diagnosis not present

## 2016-07-30 DIAGNOSIS — G8194 Hemiplegia, unspecified affecting left nondominant side: Secondary | ICD-10-CM | POA: Diagnosis present

## 2016-07-30 DIAGNOSIS — R260 Ataxic gait: Secondary | ICD-10-CM | POA: Diagnosis not present

## 2016-07-30 DIAGNOSIS — Z7902 Long term (current) use of antithrombotics/antiplatelets: Secondary | ICD-10-CM

## 2016-07-30 DIAGNOSIS — Z951 Presence of aortocoronary bypass graft: Secondary | ICD-10-CM

## 2016-07-30 DIAGNOSIS — I25810 Atherosclerosis of coronary artery bypass graft(s) without angina pectoris: Secondary | ICD-10-CM | POA: Diagnosis present

## 2016-07-30 DIAGNOSIS — I10 Essential (primary) hypertension: Secondary | ICD-10-CM | POA: Diagnosis present

## 2016-07-30 DIAGNOSIS — F17210 Nicotine dependence, cigarettes, uncomplicated: Secondary | ICD-10-CM | POA: Diagnosis present

## 2016-07-30 DIAGNOSIS — I219 Acute myocardial infarction, unspecified: Secondary | ICD-10-CM

## 2016-07-30 DIAGNOSIS — Z7901 Long term (current) use of anticoagulants: Secondary | ICD-10-CM

## 2016-07-30 DIAGNOSIS — Z79899 Other long term (current) drug therapy: Secondary | ICD-10-CM

## 2016-07-30 DIAGNOSIS — G459 Transient cerebral ischemic attack, unspecified: Secondary | ICD-10-CM | POA: Diagnosis not present

## 2016-07-30 LAB — DIFFERENTIAL
Basophils Absolute: 0.1 10*3/uL (ref 0.0–0.1)
Basophils Relative: 1 %
EOS PCT: 4 %
Eosinophils Absolute: 0.4 10*3/uL (ref 0.0–0.7)
Lymphocytes Relative: 34 %
Lymphs Abs: 3.8 10*3/uL (ref 0.7–4.0)
MONO ABS: 0.8 10*3/uL (ref 0.1–1.0)
MONOS PCT: 8 %
Neutro Abs: 5.9 10*3/uL (ref 1.7–7.7)
Neutrophils Relative %: 53 %

## 2016-07-30 LAB — I-STAT CHEM 8, ED
BUN: 15 mg/dL (ref 6–20)
Calcium, Ion: 1.14 mmol/L — ABNORMAL LOW (ref 1.15–1.40)
Chloride: 105 mmol/L (ref 101–111)
Creatinine, Ser: 1.5 mg/dL — ABNORMAL HIGH (ref 0.61–1.24)
GLUCOSE: 96 mg/dL (ref 65–99)
HCT: 43 % (ref 39.0–52.0)
HEMOGLOBIN: 14.6 g/dL (ref 13.0–17.0)
Potassium: 3.1 mmol/L — ABNORMAL LOW (ref 3.5–5.1)
SODIUM: 142 mmol/L (ref 135–145)
TCO2: 25 mmol/L (ref 0–100)

## 2016-07-30 LAB — URINALYSIS, ROUTINE W REFLEX MICROSCOPIC
BILIRUBIN URINE: NEGATIVE
Bacteria, UA: NONE SEEN
Glucose, UA: NEGATIVE mg/dL
Ketones, ur: NEGATIVE mg/dL
Leukocytes, UA: NEGATIVE
Nitrite: NEGATIVE
PH: 6 (ref 5.0–8.0)
Protein, ur: NEGATIVE mg/dL
SPECIFIC GRAVITY, URINE: 1.005 (ref 1.005–1.030)

## 2016-07-30 LAB — RAPID URINE DRUG SCREEN, HOSP PERFORMED
AMPHETAMINES: NOT DETECTED
BARBITURATES: NOT DETECTED
BENZODIAZEPINES: NOT DETECTED
COCAINE: NOT DETECTED
Opiates: NOT DETECTED
TETRAHYDROCANNABINOL: NOT DETECTED

## 2016-07-30 LAB — GLUCOSE, CAPILLARY: GLUCOSE-CAPILLARY: 101 mg/dL — AB (ref 65–99)

## 2016-07-30 LAB — CBC
HEMATOCRIT: 43.2 % (ref 39.0–52.0)
HEMOGLOBIN: 15.2 g/dL (ref 13.0–17.0)
MCH: 29.2 pg (ref 26.0–34.0)
MCHC: 35.2 g/dL (ref 30.0–36.0)
MCV: 82.9 fL (ref 78.0–100.0)
Platelets: 324 10*3/uL (ref 150–400)
RBC: 5.21 MIL/uL (ref 4.22–5.81)
RDW: 14.3 % (ref 11.5–15.5)
WBC: 11.1 10*3/uL — ABNORMAL HIGH (ref 4.0–10.5)

## 2016-07-30 LAB — COMPREHENSIVE METABOLIC PANEL
ALT: 23 U/L (ref 17–63)
ANION GAP: 8 (ref 5–15)
AST: 20 U/L (ref 15–41)
Albumin: 4 g/dL (ref 3.5–5.0)
Alkaline Phosphatase: 140 U/L — ABNORMAL HIGH (ref 38–126)
BILIRUBIN TOTAL: 0.5 mg/dL (ref 0.3–1.2)
BUN: 16 mg/dL (ref 6–20)
CO2: 25 mmol/L (ref 22–32)
Calcium: 9.2 mg/dL (ref 8.9–10.3)
Chloride: 107 mmol/L (ref 101–111)
Creatinine, Ser: 1.52 mg/dL — ABNORMAL HIGH (ref 0.61–1.24)
GFR calc Af Amer: 53 mL/min — ABNORMAL LOW (ref >60)
GFR, EST NON AFRICAN AMERICAN: 45 mL/min — AB (ref >60)
Glucose, Bld: 100 mg/dL — ABNORMAL HIGH (ref 65–99)
POTASSIUM: 3.1 mmol/L — AB (ref 3.5–5.1)
Sodium: 140 mmol/L (ref 135–145)
Total Protein: 7.1 g/dL (ref 6.5–8.1)

## 2016-07-30 LAB — APTT: aPTT: 28 seconds (ref 24–36)

## 2016-07-30 LAB — CBG MONITORING, ED: GLUCOSE-CAPILLARY: 94 mg/dL (ref 65–99)

## 2016-07-30 LAB — ETHANOL: Alcohol, Ethyl (B): <5 mg/dL (ref <5)

## 2016-07-30 LAB — PROTIME-INR
INR: 0.94
Prothrombin Time: 12.5 seconds (ref 11.4–15.2)

## 2016-07-30 LAB — I-STAT TROPONIN, ED: Troponin i, poc: 0 ng/mL (ref 0.00–0.08)

## 2016-07-30 MED ORDER — AMLODIPINE BESYLATE 5 MG PO TABS
5.0000 mg | ORAL_TABLET | Freq: Every day | ORAL | Status: DC
Start: 2016-07-31 — End: 2016-07-31
  Administered 2016-07-31: 5 mg via ORAL
  Filled 2016-07-30: qty 1

## 2016-07-30 MED ORDER — BUSPIRONE HCL 5 MG PO TABS
10.0000 mg | ORAL_TABLET | Freq: Three times a day (TID) | ORAL | Status: DC
  Administered 2016-07-30 – 2016-07-31 (×3): 10 mg via ORAL
  Filled 2016-07-30 (×3): qty 2

## 2016-07-30 MED ORDER — ASPIRIN 300 MG RE SUPP: 300.0000 mg | Freq: Every day | RECTAL | Status: DC

## 2016-07-30 MED ORDER — ACETAMINOPHEN 325 MG PO TABS: 650.0000 mg | ORAL_TABLET | ORAL | Status: DC | PRN

## 2016-07-30 MED ORDER — ASPIRIN 325 MG PO TABS
325.0000 mg | ORAL_TABLET | Freq: Every day | ORAL | Status: DC
  Administered 2016-07-30 – 2016-07-31 (×2): 325 mg via ORAL
  Filled 2016-07-30: qty 1

## 2016-07-30 MED ORDER — IRBESARTAN 300 MG PO TABS
300.0000 mg | ORAL_TABLET | Freq: Every day | ORAL | Status: DC
  Administered 2016-07-31: 300 mg via ORAL
  Filled 2016-07-30: qty 1

## 2016-07-30 MED ORDER — TRAMADOL HCL 50 MG PO TABS: 50.0000 mg | ORAL_TABLET | Freq: Four times a day (QID) | ORAL | Status: DC | PRN

## 2016-07-30 MED ORDER — ASPIRIN 81 MG PO CHEW
324.0000 mg | CHEWABLE_TABLET | Freq: Once | ORAL | Status: AC
  Administered 2016-07-30: 324 mg via ORAL
  Filled 2016-07-30: qty 4

## 2016-07-30 MED ORDER — CLOPIDOGREL BISULFATE 75 MG PO TABS
75.0000 mg | ORAL_TABLET | Freq: Every day | ORAL | Status: DC
  Administered 2016-07-31: 75 mg via ORAL
  Filled 2016-07-30: qty 1

## 2016-07-30 MED ORDER — ALPRAZOLAM 0.5 MG PO TABS
0.5000 mg | ORAL_TABLET | Freq: Two times a day (BID) | ORAL | Status: DC | PRN
  Administered 2016-07-30 – 2016-07-31 (×2): 0.5 mg via ORAL
  Filled 2016-07-30 (×2): qty 1

## 2016-07-30 MED ORDER — ACETAMINOPHEN 160 MG/5ML PO SOLN: 650.0000 mg | ORAL | Status: DC | PRN

## 2016-07-30 MED ORDER — ATORVASTATIN CALCIUM 40 MG PO TABS
80.0000 mg | ORAL_TABLET | Freq: Every day | ORAL | Status: DC
  Administered 2016-07-30: 80 mg via ORAL
  Filled 2016-07-30: qty 2

## 2016-07-30 MED ORDER — ACETAMINOPHEN 650 MG RE SUPP: 650.0000 mg | RECTAL | Status: DC | PRN

## 2016-07-30 MED ORDER — STROKE: EARLY STAGES OF RECOVERY BOOK
Freq: Once | Status: AC
  Administered 2016-07-31: 12:00:00
  Filled 2016-07-30: qty 1

## 2016-07-30 MED ORDER — METOPROLOL SUCCINATE ER 50 MG PO TB24
50.0000 mg | ORAL_TABLET | Freq: Every day | ORAL | Status: DC
  Administered 2016-07-31: 50 mg via ORAL
  Filled 2016-07-30: qty 1

## 2016-07-30 MED ORDER — MOMETASONE FURO-FORMOTEROL FUM 200-5 MCG/ACT IN AERO
2.0000 | INHALATION_SPRAY | Freq: Two times a day (BID) | RESPIRATORY_TRACT | Status: DC
  Administered 2016-07-30 – 2016-07-31 (×2): 2 via RESPIRATORY_TRACT
  Filled 2016-07-30: qty 8.8

## 2016-07-30 MED ORDER — ALBUTEROL SULFATE (2.5 MG/3ML) 0.083% IN NEBU: 3.0000 mL | INHALATION_SOLUTION | Freq: Four times a day (QID) | RESPIRATORY_TRACT | Status: DC | PRN

## 2016-07-30 MED ORDER — SENNOSIDES-DOCUSATE SODIUM 8.6-50 MG PO TABS: 1.0000 | ORAL_TABLET | Freq: Every evening | ORAL | Status: DC | PRN

## 2016-07-30 MED ORDER — MELATONIN 3 MG PO TABS: 6.0000 mg | ORAL_TABLET | Freq: Every day | ORAL | Status: DC

## 2016-07-30 MED ORDER — ENOXAPARIN SODIUM 40 MG/0.4ML ~~LOC~~ SOLN
40.0000 mg | SUBCUTANEOUS | Status: DC
  Administered 2016-07-30: 40 mg via SUBCUTANEOUS
  Filled 2016-07-30: qty 0.4

## 2016-07-30 MED ORDER — MOMETASONE FURO-FORMOTEROL FUM 200-5 MCG/ACT IN AERO
INHALATION_SPRAY | RESPIRATORY_TRACT | Status: AC
  Filled 2016-07-30: qty 8.8

## 2016-07-30 NOTE — H&P (Signed)
History and Physical  Darryl George HLK:[id number] DOB: 12/17/1950 DOA: 07/30/2016  Referring physician: Dr Ranae Palms, ED physician PCP: PROVIDER NOT IN SYSTEM  Outpatient Specialists:   Donata Clay, MD (CT Surg)  Katrinka Blazing, MD (Cardiology)  Patient Coming From: home  Chief Complaint: Left arm numbness  HPI: Darryl George is a 66 y.o. male with a history of stroke, prior MI, COPD, hypertension, tobacco abuse, paroxysmal atrial fibrillation (CHADS2 Vasc score 1, on ASA). Patient began to have left-sided upper and lower extremity numbness while watching TV that started around 4:15 p.m. the patient had some difficulty ambulating per the wife. No palliating or provoking factors. No vision changes or speech changes. Patient had a previous stroke that was associated with dizziness, nausea, and gait abnormalities. He denies chest pain or shortness of breath. The patient came to emergency room for evaluation. While in the emergency room, the patient's symptoms had resolved.  Emergency Department Course:  event head shows no acute intracranial hemorrhage or definitive acute infarct. Aspects is 10 in regards to right cerebral hemisphere. Age indeterminant infarct in the left caudate head and internal capsule. Chronic lacunar infarcts. Chronic right occipital infarct.  Review of Systems:   Pt denies any fevers, chills, nausea, vomiting, diarrhea, constipation, abdominal pain, shortness of breath, dyspnea on exertion, orthopnea, cough, wheezing, palpitations, headache, vision changes, lightheadedness, dizziness, melena, rectal bleeding.  Review of systems are otherwise negative  Past Medical History:  Diagnosis Date  . Back pain   . COPD (chronic obstructive pulmonary disease) (HCC)   . Hypertension   . Stroke [facility])    Noted on MRI scan performed at [facility]  . Tobacco abuse    Past Surgical History:  Procedure Laterality Date  . CARDIAC CATHETERIZATION N/A 01/23/2016   Procedure: Left Heart Cath and  Coronary Angiography;  Surgeon: Lyn Records, MD;  Location: [facility] INVASIVE CV LAB;  Service: Cardiovascular;  Laterality: N/A;  . CORONARY ARTERY BYPASS GRAFT N/A 01/25/2016   Procedure: CORONARY ARTERY BYPASS GRAFTING (CABG) times five using left internal mammary artery and right saphenous vein.;  Surgeon: Kerin Perna, MD;  Location: [facility] OR;  Service: Open Heart Surgery;  Laterality: N/A;  . EYE SURGERY    . HERNIA REPAIR    . TEE WITHOUT CARDIOVERSION  01/25/2016   Procedure: TRANSESOPHAGEAL ECHOCARDIOGRAM (TEE);  Surgeon: Kerin Perna, MD;  Location: [facility] OR;  Service: Open Heart Surgery;;   Social History:  reports that he has been smoking Cigarettes.  He has been smoking about 1.00 pack per day. He has never used smokeless tobacco. He reports that he does not drink alcohol or use drugs. Patient lives at Home  No Known Allergies  Family History  Problem Relation Age of Onset  . Cancer Mother   . Heart failure Mother   . Kidney disease Mother   . Aneurysm Father   . Hypertension Father   . Cancer Other   . Breast cancer Sister      Prior to Admission medications   Medication Sig Start Date End Date Taking? Authorizing Provider  albuterol (PROVENTIL HFA;VENTOLIN HFA) 108 (90 Base) MCG/ACT inhaler Inhale 2 puffs into the lungs every 6 (six) hours as needed for wheezing or shortness of breath.   Yes Historical Provider, MD  ALPRAZolam Prudy Feeler) 0.5 MG tablet Take 1 tablet (0.5 mg total) by mouth 2 (two) times daily as needed for anxiety. 02/11/16  Yes Leone Brand, NP  amLODipine (NORVASC) 5 MG tablet Take 5 mg  by mouth daily.   Yes Historical Provider, MD  atorvastatin (LIPITOR) 80 MG tablet Take 1 tablet (80 mg total) by mouth daily at 6 PM. Patient taking differently: Take 80 mg by mouth at bedtime.  02/11/16  Yes Leone Brand, NP  busPIRone (BUSPAR) 10 MG tablet Take 10 mg by mouth 3 (three) times daily.   Yes Historical Provider, MD  clopidogrel (PLAVIX) 75 MG tablet Take 75 mg  by mouth daily.   Yes Historical Provider, MD  levalbuterol Pauline Aus) 1.25 MG/0.5ML nebulizer solution Take 1.25 mg by nebulization every 6 (six) hours as needed for wheezing or shortness of breath. 02/04/16  Yes Sharlene Dory, PA-C  Melatonin 3 MG TABS Take 6 mg by mouth at bedtime.   Yes Historical Provider, MD  metoprolol succinate (TOPROL-XL) 100 MG 24 hr tablet Take 50 mg by mouth daily. Take with or immediately following a meal.    Yes Historical Provider, MD  mometasone-formoterol (DULERA) 200-5 MCG/ACT AERO Inhale 2 puffs into the lungs 2 (two) times daily. 02/04/16  Yes Sharlene Dory, PA-C  valsartan (DIOVAN) 320 MG tablet Take 320 mg by mouth daily.   Yes Historical Provider, MD  vitamin B-12 (CYANOCOBALAMIN) 1000 MCG tablet Take 1,000 mcg by mouth daily.   Yes Historical Provider, MD    Physical Exam: BP (!) 158/82   Pulse 62   Temp 97.5 F (36.4 C)   Resp 15   Ht 6\' 2"  (1.88 m)   Wt 81.6 kg (180 lb)   SpO2 95%   BMI 23.11 kg/m   General: Elderly Caucasian male. Awake and alert and oriented x3. No acute cardiopulmonary distress.  HEENT: Normocephalic atraumatic.  Right and left ears normal in appearance.  Pupils equal, round, reactive to light. Extraocular muscles are intact. Sclerae anicteric and noninjected.  Moist mucosal membranes. No mucosal lesions.  Neck: Neck supple without lymphadenopathy. No carotid bruits. No masses palpated.  Cardiovascular: Regular rate with normal S1-S2 sounds. No murmurs, rubs, gallops auscultated. No JVD.  Respiratory: Good respiratory effort with no wheezes, rales, rhonchi. Lungs clear to auscultation bilaterally.  No accessory muscle use. Abdomen: Soft, nontender, nondistended. Active bowel sounds. No masses or hepatosplenomegaly  Skin: Well healed sternotomy incision. No rashes, lesions, or ulcerations.  Dry, warm to touch. 2+ dorsalis pedis and radial pulses. Musculoskeletal: No calf or leg pain. All major joints not erythematous nontender.   No upper or lower joint deformation.  Good ROM.  No contractures  Psychiatric: Intact judgment and insight. Pleasant and cooperative. Neurologic: No focal neurological deficits. Strength is 5/5 and symmetric in upper and lower extremities.  Coordination intact. Sensation intact. Cranial nerves II through XII are grossly intact.           Labs on Admission: I have personally reviewed following labs and imaging studies  CBC:  Recent Labs Lab 07/30/16 1726 07/30/16 1733  WBC 11.1*  --   NEUTROABS 5.9  --   HGB 15.2 14.6  HCT 43.2 43.0  MCV 82.9  --   PLT 324  --    Basic Metabolic Panel:  Recent Labs Lab 07/30/16 1726 07/30/16 1733  NA 140 142  K 3.1* 3.1*  CL 107 105  CO2 25  --   GLUCOSE 100* 96  BUN 16 15  CREATININE 1.52* 1.50*  CALCIUM 9.2  --    GFR: Estimated Creatinine Clearance: 54.4 mL/min (A) (by C-G formula based on SCr of 1.5 mg/dL (H)). Liver Function Tests:  Recent Labs  Lab 07/30/16 1726  AST 20  ALT 23  ALKPHOS 140*  BILITOT 0.5  PROT 7.1  ALBUMIN 4.0   No results for input(s): LIPASE, AMYLASE in the last 168 hours. No results for input(s): AMMONIA in the last 168 hours. Coagulation Profile:  Recent Labs Lab 07/30/16 1726  INR 0.94   Cardiac Enzymes: No results for input(s): CKTOTAL, CKMB, CKMBINDEX, TROPONINI in the last 168 hours. BNP (last 3 results) No results for input(s): PROBNP in the last 8760 hours. HbA1C: No results for input(s): HGBA1C in the last 72 hours. CBG:  Recent Labs Lab 07/30/16 1740  GLUCAP 94   Lipid Profile: No results for input(s): CHOL, HDL, LDLCALC, TRIG, CHOLHDL, LDLDIRECT in the last 72 hours. Thyroid Function Tests: No results for input(s): TSH, T4TOTAL, FREET4, T3FREE, THYROIDAB in the last 72 hours. Anemia Panel: No results for input(s): VITAMINB12, FOLATE, FERRITIN, TIBC, IRON, RETICCTPCT in the last 72 hours. Urine analysis:    Component Value Date/Time   COLORURINE STRAW (A) 07/30/2016  1728   APPEARANCEUR CLEAR 07/30/2016 1728   LABSPEC 1.005 07/30/2016 1728   PHURINE 6.0 07/30/2016 1728   GLUCOSEU NEGATIVE 07/30/2016 1728   HGBUR SMALL (A) 07/30/2016 1728   BILIRUBINUR NEGATIVE 07/30/2016 1728   KETONESUR NEGATIVE 07/30/2016 1728   PROTEINUR NEGATIVE 07/30/2016 1728   NITRITE NEGATIVE 07/30/2016 1728   LEUKOCYTESUR NEGATIVE 07/30/2016 1728   Sepsis Labs: @LABRCNTIP (procalcitonin:4,lacticidven:4) )No results found for this or any previous visit (from the past 240 hour(s)).   Radiological Exams on Admission: Ct Head Code Stroke W/o Cm  Result Date: 07/30/2016 CLINICAL DATA:  Code stroke.  Left arm and facial numbness. EXAM: CT HEAD WITHOUT CONTRAST TECHNIQUE: Contiguous axial images were obtained from the base of the skull through the vertex without intravenous contrast. COMPARISON:  None. FINDINGS: Brain: There is no evidence of acute cortical infarct, intracranial hemorrhage, mass, midline shift, or extra-axial fluid collection. There is encephalomalacia in the right occipital lobe consistent with a chronic infarct. Patchy cerebral white matter hypodensities bilaterally are nonspecific but compatible with moderate chronic small vessel ischemic disease. Lacunar infarcts in the body of the left caudate, left thalamus, and right lentiform nucleus are favored to be chronic. There is a 2 cm region of hypoattenuation involving the left caudate head and adjacent anterior limb of the internal capsule which is not clearly chronic. Vascular: Calcified atherosclerosis at the skullbase. No hyperdense vessel. Skull: No fracture or focal osseous lesion. Sinuses/Orbits: Visualized paranasal sinuses and mastoid air cells are clear. Prior bilateral cataract extraction. Other: None. ASPECTS (Alberta Stroke Program Early CT Score) with regards to the right cerebral hemisphere: - Ganglionic level infarction (caudate, lentiform nuclei, internal capsule, insula, M1-M3 cortex): 7 - Supraganglionic  infarction (M4-M6 cortex): 3 Total score (0-10 with 10 being normal): 10 IMPRESSION: 1. No acute intracranial hemorrhage or definite acute infarct to explain left-sided numbness. 2. ASPECTS is 10 with regards to the right cerebral hemisphere. 3. Age indeterminate, though potentially recent infarct in the left caudate head and internal capsule. 4. Additional bilateral deep gray nuclei lacunar infarcts which are favored to be chronic. 5. Chronic right occipital infarct. 6. Moderate chronic small vessel ischemic disease. These results were called by telephone at the time of interpretation on 07/30/2016 at 5:50 pm to Dr. Loren Racer , who verbally acknowledged these results. Electronically Signed   By: Sebastian Ache M.D.   On: 07/30/2016 17:52    EKG: Independently reviewed. Sinus rhythm. Left axis deviation. No acute ST changes.  Assessment/Plan: Principal Problem:   TIA (transient ischemic attack) Active Problems:   CAD (coronary artery disease) of artery bypass graft   Chronic anticoagulation   Essential hypertension   COPD (chronic obstructive pulmonary disease) (HCC)   Paroxysmal atrial fibrillation (HCC)    This patient was discussed with the ED physician, including pertinent vitals, physical exam findings, labs, and imaging.  We also discussed care given by the ED provider.  #1 TIA  Observation on telemetry  MRI/MRA head  Carotid Dopplers   Echocardiogram tomorrow  Hemoglobin A1c, lipid panel in the morning  PT/OT/speech therapy consult  Full aspirin  Resume Plavix #2 paroxysmal atrial fib ablation  Per cardiology, patient only needs to be on aspirin. No recurrence of paroxysmal atrial fibrillation #3 hypertension  Resume antihypertensive therapy #4 coronary artery disease  Continue aspirin and Lipitor #5 COPD    DVT prophylaxis: Lovenox Consultants: None Code Status: Full code Family Communication: None  Disposition Plan: She should be able to return  home.   Levie Heritage, DO [facility] Pager [phone]  If 7PM-7AM, please contact night-coverage [email or web address] Password [email or web address]

## 2016-07-30 NOTE — ED Notes (Signed)
Teleneuro in progress. 

## 2016-07-30 NOTE — ED Notes (Signed)
Pt mentions that he has not taken coumadin in 3 weeks because he has not received it in the mail yet.  Neurologist heard this information.

## 2016-07-30 NOTE — Progress Notes (Signed)
CODE STROKE 17:29  Call time 17:31 exam started 17:33 exam finished 17:36 beeper 1736 exam finished, images sent to [facility], 17:37 called [facility] Radiology spoke with Archie Patten

## 2016-07-30 NOTE — ED Notes (Signed)
ED Provider at bedside. 

## 2016-07-30 NOTE — ED Provider Notes (Signed)
[facility]-EMERGENCY DEPT Provider Note   CSN: [id number] Arrival date & time: 07/30/16  1715     History   Chief Complaint Chief Complaint  Patient presents with  . Code Stroke    HPI Darryl George is a 66 y.o. male.  HPI Patient with history of previous stroke, MI and CAD presents with acute onset left sided upper and lower extremity numbness while watching television today at 4:15pm. Wife notes patient had some difficulty ambulating due to being off balance. Patient denies any focal weakness. Denied any vision changes or speech changes. Has previous stroke that was associated with dizziness, nausea and gait abnormality. Denies any chest pain or shortness of breath. Past Medical History:  Diagnosis Date  . Back pain   . COPD (chronic obstructive pulmonary disease) (HCC)   . Hypertension   . Stroke [facility])    Noted on MRI scan performed at [facility]  . Tobacco abuse     Patient Active Problem List   Diagnosis Date Noted  . TIA (transient ischemic attack) 07/30/2016  . Paroxysmal atrial fibrillation (HCC) 01/28/2016  . Essential hypertension   . COPD (chronic obstructive pulmonary disease) (HCC)   . Stroke (HCC)   . Tobacco abuse   . CAD (coronary artery disease) of artery bypass graft 01/23/2016  . Chronic anticoagulation 01/23/2016    Past Surgical History:  Procedure Laterality Date  . CARDIAC CATHETERIZATION N/A 01/23/2016   Procedure: Left Heart Cath and Coronary Angiography;  Surgeon: Lyn Records, MD;  Location: [facility] INVASIVE CV LAB;  Service: Cardiovascular;  Laterality: N/A;  . CORONARY ARTERY BYPASS GRAFT N/A 01/25/2016   Procedure: CORONARY ARTERY BYPASS GRAFTING (CABG) times five using left internal mammary artery and right saphenous vein.;  Surgeon: Kerin Perna, MD;  Location: [facility] OR;  Service: Open Heart Surgery;  Laterality: N/A;  . EYE SURGERY    . HERNIA REPAIR    . TEE WITHOUT CARDIOVERSION  01/25/2016   Procedure: TRANSESOPHAGEAL ECHOCARDIOGRAM (TEE);   Surgeon: Kerin Perna, MD;  Location: [facility] OR;  Service: Open Heart Surgery;;       Home Medications    Prior to Admission medications   Medication Sig Start Date End Date Taking? Authorizing Provider  albuterol (PROVENTIL HFA;VENTOLIN HFA) 108 (90 Base) MCG/ACT inhaler Inhale 2 puffs into the lungs every 6 (six) hours as needed for wheezing or shortness of breath.   Yes Historical Provider, MD  ALPRAZolam Prudy Feeler) 0.5 MG tablet Take 1 tablet (0.5 mg total) by mouth 2 (two) times daily as needed for anxiety. 02/11/16  Yes Leone Brand, NP  amLODipine (NORVASC) 5 MG tablet Take 5 mg by mouth daily.   Yes Historical Provider, MD  atorvastatin (LIPITOR) 80 MG tablet Take 1 tablet (80 mg total) by mouth daily at 6 PM. Patient taking differently: Take 80 mg by mouth at bedtime.  02/11/16  Yes Leone Brand, NP  busPIRone (BUSPAR) 10 MG tablet Take 10 mg by mouth 3 (three) times daily.   Yes Historical Provider, MD  clopidogrel (PLAVIX) 75 MG tablet Take 75 mg by mouth daily.   Yes Historical Provider, MD  levalbuterol Pauline Aus) 1.25 MG/0.5ML nebulizer solution Take 1.25 mg by nebulization every 6 (six) hours as needed for wheezing or shortness of breath. 02/04/16  Yes Sharlene Dory, PA-C  Melatonin 3 MG TABS Take 6 mg by mouth at bedtime.   Yes Historical Provider, MD  metoprolol succinate (TOPROL-XL) 100 MG 24 hr tablet Take 50 mg  by mouth daily. Take with or immediately following a meal.    Yes Historical Provider, MD  mometasone-formoterol (DULERA) 200-5 MCG/ACT AERO Inhale 2 puffs into the lungs 2 (two) times daily. 02/04/16  Yes Sharlene Dory, PA-C  valsartan (DIOVAN) 320 MG tablet Take 320 mg by mouth daily.   Yes Historical Provider, MD  vitamin B-12 (CYANOCOBALAMIN) 1000 MCG tablet Take 1,000 mcg by mouth daily.   Yes Historical Provider, MD    Family History Family History  Problem Relation Age of Onset  . Cancer Mother   . Heart failure Mother   . Kidney disease Mother   . Aneurysm  Father   . Hypertension Father   . Cancer Other   . Breast cancer Sister     Social History Social History  Substance Use Topics  . Smoking status: Current Every Day Smoker    Packs/day: 1.00    Types: Cigarettes  . Smokeless tobacco: Never Used  . Alcohol use No     Comment: once a month     Allergies   Patient has no known allergies.   Review of Systems Review of Systems  Constitutional: Negative for chills and fever.  HENT: Negative for facial swelling.   Eyes: Negative for visual disturbance.  Respiratory: Negative for shortness of breath.   Cardiovascular: Negative for chest pain, palpitations and leg swelling.  Gastrointestinal: Negative for abdominal pain, constipation, diarrhea, nausea and vomiting.  Genitourinary: Negative for dysuria, flank pain and frequency.  Musculoskeletal: Positive for gait problem. Negative for back pain and neck pain.  Skin: Negative for rash and wound.  Neurological: Negative for dizziness, tremors, speech difficulty, weakness, light-headedness, numbness and headaches.  All other systems reviewed and are negative.    Physical Exam Updated Vital Signs BP (!) 142/69 (BP Location: Right Arm)   Pulse (!) 55   Temp 97.4 F (36.3 C) (Oral)   Resp 20   Ht 6\' 1"  (1.854 m)   Wt 184 lb 6.4 oz (83.6 kg)   SpO2 99%   BMI 24.33 kg/m   Physical Exam  Constitutional: He is oriented to person, place, and time. He appears well-developed and well-nourished. No distress.  HENT:  Head: Normocephalic and atraumatic.  Mouth/Throat: Oropharynx is clear and moist. No oropharyngeal exudate.  Eyes: EOM are normal. Pupils are equal, round, and reactive to light.  Neck: Normal range of motion. Neck supple.  No posterior midline cervical tenderness to palpation. No meningismus.  Cardiovascular: Normal rate and regular rhythm.  Exam reveals no gallop and no friction rub.   No murmur heard. Pulmonary/Chest: Effort normal and breath sounds normal. No  respiratory distress. He has no wheezes. He has no rales. He exhibits no tenderness.  Midline surgical scar appears well healing  Abdominal: Soft. Bowel sounds are normal. There is no tenderness. There is no rebound and no guarding.  Musculoskeletal: Normal range of motion. He exhibits no edema or tenderness.  No lower extremity swelling, asymmetry or tenderness.  Neurological: He is alert and oriented to person, place, and time.  Patient is alert and oriented x3 with clear, goal oriented speech. Patient has 5/5 motor in all extremities. Sensation is intact to light touch is diminished in left upper and left lower extremities as well as the left posterior ear.. Bilateral finger-to-nose is normal with minimal signs of dysmetria especially on the left. Patient has a normal gait and walks without assistance.  Skin: Skin is warm and dry. Capillary refill takes less than 2 seconds.  No rash noted. No erythema.  Psychiatric: He has a normal mood and affect. His behavior is normal.  Nursing note and vitals reviewed.    ED Treatments / Results  Labs (all labs ordered are listed, but only abnormal results are displayed) Labs Reviewed  CBC - Abnormal; Notable for the following:       Result Value   WBC 11.1 (*)    All other components within normal limits  COMPREHENSIVE METABOLIC PANEL - Abnormal; Notable for the following:    Potassium 3.1 (*)    Glucose, Bld 100 (*)    Creatinine, Ser 1.52 (*)    Alkaline Phosphatase 140 (*)    GFR calc non Af Amer 45 (*)    GFR calc Af Amer 53 (*)    All other components within normal limits  URINALYSIS, ROUTINE W REFLEX MICROSCOPIC - Abnormal; Notable for the following:    Color, Urine STRAW (*)    Hgb urine dipstick SMALL (*)    All other components within normal limits  HEMOGLOBIN A1C - Abnormal; Notable for the following:    Hgb A1c MFr Bld 5.7 (*)    All other components within normal limits  LIPID PANEL - Abnormal; Notable for the following:    HDL  28 (*)    All other components within normal limits  GLUCOSE, CAPILLARY - Abnormal; Notable for the following:    Glucose-Capillary 101 (*)    All other components within normal limits  BASIC METABOLIC PANEL - Abnormal; Notable for the following:    Potassium 3.2 (*)    Creatinine, Ser 1.46 (*)    Calcium 8.3 (*)    GFR calc non Af Amer 48 (*)    GFR calc Af Amer 55 (*)    All other components within normal limits  CBC - Abnormal; Notable for the following:    WBC 11.4 (*)    All other components within normal limits  I-STAT CHEM 8, ED - Abnormal; Notable for the following:    Potassium 3.1 (*)    Creatinine, Ser 1.50 (*)    Calcium, Ion 1.14 (*)    All other components within normal limits  ETHANOL  PROTIME-INR  APTT  DIFFERENTIAL  RAPID URINE DRUG SCREEN, HOSP PERFORMED  MAGNESIUM  I-STAT TROPOININ, ED  CBG MONITORING, ED    EKG  EKG Interpretation  Date/Time:  Saturday July 30 2016 17:26:37 EDT Ventricular Rate:  63 PR Interval:    QRS Duration: 111 QT Interval:  441 QTC Calculation: 452 R Axis:   -28 Text Interpretation:  Sinus rhythm Borderline left axis deviation Borderline T abnormalities, diffuse leads Confirmed by Ranae Palms  MD, Malesha Suliman ([id number]) on 07/30/2016 5:53:53 PM       Radiology No results found.  Procedures Procedures (including critical care time)  Medications Ordered in ED Medications  aspirin chewable tablet 324 mg (324 mg Oral Given 07/30/16 1829)   stroke: mapping our early stages of recovery book ( Does not apply Given 07/31/16 1200)  potassium chloride (K-DUR,KLOR-CON) CR tablet 30 mEq (30 mEq Oral Given 07/31/16 1446)     Initial Impression / Assessment and Plan / ED Course  I have reviewed the triage vital signs and the nursing notes.  Pertinent labs & imaging results that were available during my care of the patient were reviewed by me and considered in my medical decision making (see chart for details).    Evaluated by neurology.  He felt symptoms have now resolved. Discussed with hospitalist  and will admit for TIA.   Final Clinical Impressions(s) / ED Diagnoses   Final diagnoses:  Transient cerebral ischemia, unspecified type    New Prescriptions Discharge Medication List as of 07/31/2016  6:00 PM       Loren Racer, MD 08/03/16 2123

## 2016-07-30 NOTE — ED Triage Notes (Signed)
Patient reports of watching tv and started to have left sided numbness to left side of body. Wife reports patient had difficulty walking into ER. No other deficits noted. Patient a&ox4.

## 2016-07-31 ENCOUNTER — Inpatient Hospital Stay (HOSPITAL_BASED_OUTPATIENT_CLINIC_OR_DEPARTMENT_OTHER): Payer: Medicare Other

## 2016-07-31 ENCOUNTER — Inpatient Hospital Stay (HOSPITAL_COMMUNITY): Payer: Medicare Other

## 2016-07-31 DIAGNOSIS — I25810 Atherosclerosis of coronary artery bypass graft(s) without angina pectoris: Secondary | ICD-10-CM | POA: Diagnosis not present

## 2016-07-31 DIAGNOSIS — R260 Ataxic gait: Secondary | ICD-10-CM | POA: Diagnosis present

## 2016-07-31 DIAGNOSIS — Z79899 Other long term (current) drug therapy: Secondary | ICD-10-CM | POA: Diagnosis not present

## 2016-07-31 DIAGNOSIS — Z7901 Long term (current) use of anticoagulants: Secondary | ICD-10-CM

## 2016-07-31 DIAGNOSIS — I219 Acute myocardial infarction, unspecified: Secondary | ICD-10-CM | POA: Diagnosis not present

## 2016-07-31 DIAGNOSIS — J410 Simple chronic bronchitis: Secondary | ICD-10-CM | POA: Diagnosis not present

## 2016-07-31 DIAGNOSIS — I6522 Occlusion and stenosis of left carotid artery: Secondary | ICD-10-CM | POA: Diagnosis not present

## 2016-07-31 DIAGNOSIS — I63443 Cerebral infarction due to embolism of bilateral cerebellar arteries: Secondary | ICD-10-CM | POA: Diagnosis present

## 2016-07-31 DIAGNOSIS — G8194 Hemiplegia, unspecified affecting left nondominant side: Secondary | ICD-10-CM | POA: Diagnosis present

## 2016-07-31 DIAGNOSIS — Z951 Presence of aortocoronary bypass graft: Secondary | ICD-10-CM | POA: Diagnosis not present

## 2016-07-31 DIAGNOSIS — I480 Paroxysmal atrial fibrillation: Secondary | ICD-10-CM | POA: Diagnosis not present

## 2016-07-31 DIAGNOSIS — G459 Transient cerebral ischemic attack, unspecified: Secondary | ICD-10-CM | POA: Diagnosis not present

## 2016-07-31 DIAGNOSIS — I639 Cerebral infarction, unspecified: Secondary | ICD-10-CM | POA: Diagnosis not present

## 2016-07-31 DIAGNOSIS — Z7902 Long term (current) use of antithrombotics/antiplatelets: Secondary | ICD-10-CM | POA: Diagnosis not present

## 2016-07-31 DIAGNOSIS — I10 Essential (primary) hypertension: Secondary | ICD-10-CM | POA: Diagnosis not present

## 2016-07-31 DIAGNOSIS — J449 Chronic obstructive pulmonary disease, unspecified: Secondary | ICD-10-CM | POA: Diagnosis present

## 2016-07-31 DIAGNOSIS — F17210 Nicotine dependence, cigarettes, uncomplicated: Secondary | ICD-10-CM | POA: Diagnosis present

## 2016-07-31 LAB — LIPID PANEL
CHOL/HDL RATIO: 4.1 ratio
Cholesterol: 116 mg/dL (ref 0–200)
HDL: 28 mg/dL — AB (ref >40)
LDL CALC: 65 mg/dL (ref 0–99)
TRIGLYCERIDES: 113 mg/dL (ref <150)
VLDL: 23 mg/dL (ref 0–40)

## 2016-07-31 LAB — BASIC METABOLIC PANEL
Anion gap: 9 (ref 5–15)
BUN: 14 mg/dL (ref 6–20)
CO2: 22 mmol/L (ref 22–32)
CREATININE: 1.46 mg/dL — AB (ref 0.61–1.24)
Calcium: 8.3 mg/dL — ABNORMAL LOW (ref 8.9–10.3)
Chloride: 109 mmol/L (ref 101–111)
GFR calc Af Amer: 55 mL/min — ABNORMAL LOW (ref >60)
GFR, EST NON AFRICAN AMERICAN: 48 mL/min — AB (ref >60)
Glucose, Bld: 78 mg/dL (ref 65–99)
Potassium: 3.2 mmol/L — ABNORMAL LOW (ref 3.5–5.1)
SODIUM: 140 mmol/L (ref 135–145)

## 2016-07-31 LAB — CBC
HCT: 40.1 % (ref 39.0–52.0)
Hemoglobin: 13.8 g/dL (ref 13.0–17.0)
MCH: 28.8 pg (ref 26.0–34.0)
MCHC: 34.4 g/dL (ref 30.0–36.0)
MCV: 83.7 fL (ref 78.0–100.0)
PLATELETS: 295 10*3/uL (ref 150–400)
RBC: 4.79 MIL/uL (ref 4.22–5.81)
RDW: 14.6 % (ref 11.5–15.5)
WBC: 11.4 10*3/uL — AB (ref 4.0–10.5)

## 2016-07-31 LAB — ECHOCARDIOGRAM COMPLETE
Height: 73 in
WEIGHTICAEL: 2950.4 [oz_av]

## 2016-07-31 LAB — MAGNESIUM: MAGNESIUM: 2.1 mg/dL (ref 1.7–2.4)

## 2016-07-31 MED ORDER — POTASSIUM CHLORIDE CRYS ER 20 MEQ PO TBCR
30.0000 meq | EXTENDED_RELEASE_TABLET | Freq: Once | ORAL | Status: AC
Start: 2016-07-31 — End: 2016-07-31
  Administered 2016-07-31: 30 meq via ORAL
  Filled 2016-07-31: qty 1

## 2016-07-31 NOTE — Progress Notes (Signed)
07/31/2016 5:48 PM  RN notified me that patient is leaving the hospital against medical advice.

## 2016-07-31 NOTE — Discharge Summary (Signed)
Physician Discharge Summary  Darryl George WJX:[id number] DOB: [birth year] DOA: 07/30/2016  PCP: [facility]  Admit date: 07/30/2016 Discharge date: 07/31/2016  Admitted From:  Disposition:  Home   Recommendations for Outpatient Follow-up:  1. Follow up with PCP and neurologist ASAP  Patient left the hospital AGAINST MEDICAL ADVICE  Discharge Condition: Guarded  Brief/Interim Summary: HPI: Darryl George is a 66 y.o. male with a history of stroke, prior MI, COPD, hypertension, tobacco abuse, paroxysmal atrial fibrillation (CHADS2 Vasc score 1, on ASA). Patient began to have left-sided upper and lower extremity numbness while watching TV that started around 4:15 p.m. the patient had some difficulty ambulating per the wife. No palliating or provoking factors. No vision changes or speech changes. Patient had a previous stroke that was associated with dizziness, nausea, and gait abnormalities. He denies chest pain or shortness of breath. The patient came to emergency room for evaluation. While in the emergency room, the patient's symptoms had resolved.  Emergency Department Course:  event head shows no acute intracranial hemorrhage or definitive acute infarct. Aspects is 10 in regards to right cerebral hemisphere. Age indeterminant infarct in the left caudate head and internal capsule. Chronic lacunar infarcts. Chronic right occipital infarct.  1. Acute CVA - MRI reveals scattered punctate acute infarctions within the cerebellum in both cerebral hemispheres consistent with microembolic infarctions from the heart or ascending aorta. I have requested a neurology consult to assist with workup and management. I also discussed the results with the patient. The patient is insistent that he go home. He reports that he has an outpatient neurology appointment scheduled for later this week. He decided to sign out of the hospital AGAINST MEDICAL ADVICE. For antiplatelet therapy he is on Plavix which we will  be continuing at this time. His echocardiogram was reviewed and normal systolic function and mild pulmonary hypertension. His lipids are optimally controlled with an LDL of 65. Carotid Doppler studies are pending at this time. The patient was not willing to wait in the hospital to be seen by neurology and get those recommendations.  He was strongly encouraged to follow-up with the outpatient neurologist and his primary care provider and cardiologist as soon as possible. PT was consult that and planning on seeing him tomorrow but he was not willing to stay. He is ambulating very well. He passed swallow evaluation screen. His blood pressures are improving. He is strongly advised to stop smoking and using all tobacco products. The patient verbalized understanding. I discussed with him the risks of leaving the hospital including worsening symptoms major stroke and other serious problems and he verbalized understanding. He says that he gets his medical care at the [facility] and he did not plan to stay in the hospital here any longer and was signing out.  He was given supplemental potassium in the hospital prior to discharge.      DVT prophylaxis:Lovenox Consultants:None Code Status:Full code Family Communication:None Disposition Plan:Home   Consultants:  Neurology - Pt left hospital before being seen  Procedures:  MRI/MRA  Echocardiogram  Carotid Dopplers  Discharge Diagnoses:  Principal Problem:   TIA (transient ischemic attack) Active Problems:   CAD (coronary artery disease) of artery bypass graft   Chronic anticoagulation   Essential hypertension   COPD (chronic obstructive pulmonary disease) (HCC)   Paroxysmal atrial fibrillation (HCC)  Discharge Instructions   Allergies as of 07/31/2016   No Known Allergies    Prior to Admission medications   Medication Sig Start  Date End Date Taking? Authorizing Provider  albuterol (PROVENTIL HFA;VENTOLIN HFA) 108 (90 Base)  MCG/ACT inhaler Inhale 2 puffs into the lungs every 6 (six) hours as needed for wheezing or shortness of breath.   Yes Historical Provider, MD  ALPRAZolam Prudy Feeler) 0.5 MG tablet Take 1 tablet (0.5 mg total) by mouth 2 (two) times daily as needed for anxiety. 02/11/16  Yes Leone Brand, NP  amLODipine (NORVASC) 5 MG tablet Take 5 mg by mouth daily.   Yes Historical Provider, MD  atorvastatin (LIPITOR) 80 MG tablet Take 1 tablet (80 mg total) by mouth daily at 6 PM. Patient taking differently: Take 80 mg by mouth at bedtime.  02/11/16  Yes Leone Brand, NP  busPIRone (BUSPAR) 10 MG tablet Take 10 mg by mouth 3 (three) times daily.   Yes Historical Provider, MD  clopidogrel (PLAVIX) 75 MG tablet Take 75 mg by mouth daily.   Yes Historical Provider, MD  levalbuterol Pauline Aus) 1.25 MG/0.5ML nebulizer solution Take 1.25 mg by nebulization every 6 (six) hours as needed for wheezing or shortness of breath. 02/04/16  Yes Sharlene Dory, PA-C  Melatonin 3 MG TABS Take 6 mg by mouth at bedtime.   Yes Historical Provider, MD  metoprolol succinate (TOPROL-XL) 100 MG 24 hr tablet Take 50 mg by mouth daily. Take with or immediately following a meal.    Yes Historical Provider, MD  mometasone-formoterol (DULERA) 200-5 MCG/ACT AERO Inhale 2 puffs into the lungs 2 (two) times daily. 02/04/16  Yes Sharlene Dory, PA-C  valsartan (DIOVAN) 320 MG tablet Take 320 mg by mouth daily.   Yes Historical Provider, MD  vitamin B-12 (CYANOCOBALAMIN) 1000 MCG tablet Take 1,000 mcg by mouth daily.   Yes Historical Provider, MD    No Known Allergies  Procedures/Studies: Mr Brain Wo Contrast  Result Date: 07/31/2016 CLINICAL DATA:  Acute presentation with left-sided numbness. History of previous stroke. EXAM: MRI HEAD WITHOUT CONTRAST MRA HEAD WITHOUT CONTRAST TECHNIQUE: Multiplanar, multiecho pulse sequences of the brain and surrounding structures were obtained without intravenous contrast. Angiographic images of the head were  obtained using MRA technique without contrast. COMPARISON:  CT 07/30/2016 FINDINGS: MRI HEAD FINDINGS Brain: Diffusion imaging shows several punctate foci of restricted diffusion scattered throughout the brain consistent with micro embolic infarctions from the heart or ascending aorta. Foci are noted in the right cerebellum, the left cerebellum, left mesial temporal lobe, right frontal lobe, and right parietal lobe. No large or confluent infarction. No mass lesion, hemorrhage, hydrocephalus or extra-axial collection. There chronic small-vessel ischemic changes affecting the pons, cerebellum, thalami and cerebral hemispheric deep and subcortical white matter. There is old infarction in the left basal ganglia. Vascular: Major vessels at the base of the brain show flow. Skull and upper cervical spine: Negative Sinuses/Orbits: Clear/normal Other: None significant MRA HEAD FINDINGS Both internal carotid arteries are patent through the skullbase and siphon regions. Mild atherosclerotic narrowing and irregularity of the carotid siphon regions without flow limiting stenosis. The anterior and middle cerebral vessels are patent without flow limiting stenosis. There are some more distal vessel irregularities consistent with intracranial atherosclerosis. Right vertebral artery is dominant, patent to the basilar. 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 More distal basilar artery is widely patent. Left vertebral artery is non dominant and terminates in PICA. IMPRESSION: Scattered punctate acute infarctions within the cerebellum and both cerebral hemispheres consistent with micro embolic infarctions from the heart or ascending aorta. Chronic small-vessel ischemic changes  throughout the brain. Old left basal ganglia infarction. Intracranial atherosclerotic narrowing and irregularity of the more distal branch vessels diffusely. 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 Electronically Signed   By:  Paulina Fusi M.D.   On: 07/31/2016 11:19   US Carotid Bilateral (at [facility] And [facility] Only)  Result Date: 07/31/2016 CLINICAL DATA:  66 year old male with symptoms of transient ischemic attack with scattered bilateral acute ischemic strokes most consistent with emboli from the heart or ascending aorta. EXAM: BILATERAL CAROTID DUPLEX ULTRASOUND TECHNIQUE: Wallace Cullens scale imaging, color Doppler and duplex ultrasound were performed of bilateral carotid and vertebral arteries in the neck. COMPARISON:  Brain MRI 07/31/2016 FINDINGS: Criteria: Quantification of carotid stenosis is based on velocity parameters that correlate the residual internal carotid diameter with NASCET-based stenosis levels, using the diameter of the distal internal carotid lumen as the denominator for stenosis measurement. The following velocity measurements were obtained: RIGHT ICA:  71/28 cm/sec CCA:  86/17 cm/sec SYSTOLIC ICA/CCA RATIO:  0.8 DIASTOLIC ICA/CCA RATIO:  1.7 ECA:  107 cm/sec LEFT ICA:  78/24 cm/sec CCA:  77/15 cm/sec SYSTOLIC ICA/CCA RATIO:  1.0 DIASTOLIC ICA/CCA RATIO:  1.7 ECA:  123 cm/sec RIGHT CAROTID ARTERY: minimal atherosclerotic plaque in the carotid bifurcation without evidence of significant stenosis. RIGHT VERTEBRAL ARTERY:  Patent with normal antegrade flow. LEFT CAROTID ARTERY: Mild heterogeneous atherosclerotic plaque in the proximal internal carotid artery. By peak systolic velocity criteria the estimated stenosis remains less than 50%. LEFT VERTEBRAL ARTERY:  Patent with normal antegrade flow. IMPRESSION: 1. Mild (1-49%) stenosis proximal left internal carotid artery secondary to heterogenous atherosclerotic plaque. 2. No significant plaque or evidence of stenosis in the right internal carotid artery. 3. Vertebral arteries are patent with normal antegrade flow. Signed, Sterling Big, MD [facility] Electronically Signed   By: Malachy Moan M.D.   On:  07/31/2016 14:14   Mr Maxine Glenn Head/brain OE Cm  Result Date: 07/31/2016 CLINICAL DATA:  Acute presentation with left-sided numbness. History of previous stroke. EXAM: MRI HEAD WITHOUT CONTRAST MRA HEAD WITHOUT CONTRAST TECHNIQUE: Multiplanar, multiecho pulse sequences of the brain and surrounding structures were obtained without intravenous contrast. Angiographic images of the head were obtained using MRA technique without contrast. COMPARISON:  CT 07/30/2016 FINDINGS: MRI HEAD FINDINGS Brain: Diffusion imaging shows several punctate foci of restricted diffusion scattered throughout the brain consistent with micro embolic infarctions from the heart or ascending aorta. Foci are noted in the right cerebellum, the left cerebellum, left mesial temporal lobe, right frontal lobe, and right parietal lobe. No large or confluent infarction. No mass lesion, hemorrhage, hydrocephalus or extra-axial collection. There chronic small-vessel ischemic changes affecting the pons, cerebellum, thalami and cerebral hemispheric deep and subcortical white matter. There is old infarction in the left basal ganglia. Vascular: Major vessels at the base of the brain show flow. Skull and upper cervical spine: Negative Sinuses/Orbits: Clear/normal Other: None significant MRA HEAD FINDINGS Both internal carotid arteries are patent through the skullbase and siphon regions. Mild atherosclerotic narrowing and irregularity of the carotid siphon regions without flow limiting stenosis. The anterior and middle cerebral vessels are patent without flow limiting stenosis. There are some more distal vessel irregularities consistent with intracranial atherosclerosis. Right vertebral artery is dominant, patent to the basilar. 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 More distal basilar artery is widely patent. Left vertebral artery is non dominant and terminates in PICA. IMPRESSION: Scattered punctate acute infarctions within  the cerebellum and  both cerebral hemispheres consistent with micro embolic infarctions from the heart or ascending aorta. Chronic small-vessel ischemic changes throughout the brain. Old left basal ganglia infarction. Intracranial atherosclerotic narrowing and irregularity of the more distal branch vessels diffusely. 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 Electronically Signed   By: Paulina Fusi M.D.   On: 07/31/2016 11:19   Ct Head Code Stroke W/o Cm  Result Date: 07/30/2016 CLINICAL DATA:  Code stroke.  Left arm and facial numbness. EXAM: CT HEAD WITHOUT CONTRAST TECHNIQUE: Contiguous axial images were obtained from the base of the skull through the vertex without intravenous contrast. COMPARISON:  None. FINDINGS: Brain: There is no evidence of acute cortical infarct, intracranial hemorrhage, mass, midline shift, or extra-axial fluid collection. There is encephalomalacia in the right occipital lobe consistent with a chronic infarct. Patchy cerebral white matter hypodensities bilaterally are nonspecific but compatible with moderate chronic small vessel ischemic disease. Lacunar infarcts in the body of the left caudate, left thalamus, and right lentiform nucleus are favored to be chronic. There is a 2 cm region of hypoattenuation involving the left caudate head and adjacent anterior limb of the internal capsule which is not clearly chronic. Vascular: Calcified atherosclerosis at the skullbase. No hyperdense vessel. Skull: No fracture or focal osseous lesion. Sinuses/Orbits: Visualized paranasal sinuses and mastoid air cells are clear. Prior bilateral cataract extraction. Other: None. ASPECTS (Alberta Stroke Program Early CT Score) with regards to the right cerebral hemisphere: - Ganglionic level infarction (caudate, lentiform nuclei, internal capsule, insula, M1-M3 cortex): 7 - Supraganglionic infarction (M4-M6 cortex): 3 Total score (0-10 with 10 being normal): 10 IMPRESSION: 1. No acute intracranial  hemorrhage or definite acute infarct to explain left-sided numbness. 2. ASPECTS is 10 with regards to the right cerebral hemisphere. 3. Age indeterminate, though potentially recent infarct in the left caudate head and internal capsule. 4. Additional bilateral deep gray nuclei lacunar infarcts which are favored to be chronic. 5. Chronic right occipital infarct. 6. Moderate chronic small vessel ischemic disease. These results were called by telephone at the time of interpretation on 07/30/2016 at 5:50 pm to Dr. Loren Racer , who verbally acknowledged these results. Electronically Signed   By: Sebastian Ache M.D.   On: 07/30/2016 17:52    (Echo, Carotid, EGD, Colonoscopy, ERCP)    Subjective:  Patient reports that all of his symptoms have resolved.  Discharge Exam: Vitals:   07/31/16 1200 07/31/16 1600  BP: (!) 141/97 (!) 142/69  Pulse: (!) 55 (!) 55  Resp: 18 20  Temp: 97.5 F (36.4 C) 97.4 F (36.3 C)   Vitals:   07/31/16 0800 07/31/16 1200 07/31/16 1459 07/31/16 1600  BP: 129/86 (!) 141/97  (!) 142/69  Pulse: (!) 58 (!) 55  (!) 55  Resp: 18 18  20   Temp: 97.8 F (36.6 C) 97.5 F (36.4 C)  97.4 F (36.3 C)  TempSrc: Oral Oral  Oral  SpO2:  97% 97% 99%  Weight:      Height:       General exam: Awake, alert, no apparent distress, no facial drooping or slurred speech noted. Respiratory system: Clear. No increased work of breathing. Cardiovascular system: S1 & S2 heard. No JVD, murmurs, gallops, clicks or pedal edema. Gastrointestinal system: Abdomen is nondistended, soft and nontender. Normal bowel sounds heard. Central nervous system: Alert and oriented. No focal neurological deficits. Extremities: no CCE.   The results of significant diagnostics from this hospitalization (including imaging, microbiology, ancillary and laboratory) are listed below for reference.  Microbiology: No results found for this or any previous visit (from the past 240 hour(s)).   Labs: BNP  (last 3 results) No results for input(s): BNP in the last 8760 hours. Basic Metabolic Panel:  Recent Labs Lab 07/30/16 1726 07/30/16 1733 07/31/16 0634  NA 140 142 140  K 3.1* 3.1* 3.2*  CL 107 105 109  CO2 25  --  22  GLUCOSE 100* 96 78  BUN 16 15 14   CREATININE 1.52* 1.50* 1.46*  CALCIUM 9.2  --  8.3*  MG  --   --  2.1   Liver Function Tests:  Recent Labs Lab 07/30/16 1726  AST 20  ALT 23  ALKPHOS 140*  BILITOT 0.5  PROT 7.1  ALBUMIN 4.0   No results for input(s): LIPASE, AMYLASE in the last 168 hours. No results for input(s): AMMONIA in the last 168 hours. CBC:  Recent Labs Lab 07/30/16 1726 07/30/16 1733 07/31/16 0634  WBC 11.1*  --  11.4*  NEUTROABS 5.9  --   --   HGB 15.2 14.6 13.8  HCT 43.2 43.0 40.1  MCV 82.9  --  83.7  PLT 324  --  295   Cardiac Enzymes: No results for input(s): CKTOTAL, CKMB, CKMBINDEX, TROPONINI in the last 168 hours. BNP: Invalid input(s): POCBNP CBG:  Recent Labs Lab 07/30/16 1740 07/30/16 2256  GLUCAP 94 101*   D-Dimer No results for input(s): DDIMER in the last 72 hours. Hgb A1c No results for input(s): HGBA1C in the last 72 hours. Lipid Profile  Recent Labs  07/31/16 0634  CHOL 116  HDL 28*  LDLCALC 65  TRIG 960  CHOLHDL 4.1   Thyroid function studies No results for input(s): TSH, T4TOTAL, T3FREE, THYROIDAB in the last 72 hours.  Invalid input(s): FREET3 Anemia work up No results for input(s): VITAMINB12, FOLATE, FERRITIN, TIBC, IRON, RETICCTPCT in the last 72 hours. Urinalysis    Component Value Date/Time   COLORURINE STRAW (A) 07/30/2016 1728   APPEARANCEUR CLEAR 07/30/2016 1728   LABSPEC 1.005 07/30/2016 1728   PHURINE 6.0 07/30/2016 1728   GLUCOSEU NEGATIVE 07/30/2016 1728   HGBUR SMALL (A) 07/30/2016 1728   BILIRUBINUR NEGATIVE 07/30/2016 1728   KETONESUR NEGATIVE 07/30/2016 1728   PROTEINUR NEGATIVE 07/30/2016 1728   NITRITE NEGATIVE 07/30/2016 1728   LEUKOCYTESUR NEGATIVE 07/30/2016  1728   Sepsis Labs Invalid input(s): PROCALCITONIN,  WBC,  LACTICIDVEN Microbiology No results found for this or any previous visit (from the past 240 hour(s)).  SIGNED:  Standley Dakins, MD  [facility] 07/31/2016, 5:49 PM Pager   If 7PM-7AM, please contact night-coverage [email or web address] Password [email or web address]

## 2016-07-31 NOTE — Progress Notes (Signed)
Phone Note  Spoke with Arline Asp, RN concerning pt's status. She reports that he is performing at his baseline and that he has been up/walking around without any mobility deficits since he arrived on the floor last night. Notified her that PT will plan to see pt tomorrow if he is still there and she verbalized agreement with this.    11:07 AM,07/31/16 Marylyn Ishihara PT, DPT [facility]

## 2016-07-31 NOTE — Progress Notes (Signed)
Patient requesting to leave the hospital against medical advice. Patient understands the risk of leaving against medical advice. Patient states he will "not stay for another night" and will not "wait around anymore." IV and telemetry removed. Arline Asp, RN notified Laural Benes, MD of patient leaving.   Quita Skye, RN

## 2016-07-31 NOTE — Progress Notes (Signed)
*  PRELIMINARY RESULTS* Echocardiogram 2D Echocardiogram has been performed.  Stacey Drain 07/31/2016, 12:26 PM

## 2016-07-31 NOTE — Progress Notes (Signed)
PROGRESS NOTE    Darryl George  GGE:[id number]  DOB: [birth year]  DOA: 07/30/2016 PCP: PROVIDER NOT IN SYSTEM Outpatient Specialists:   Hospital course:  Darryl George is a 66 y.o. male with a history of stroke, prior MI, COPD, hypertension, tobacco abuse, paroxysmal atrial fibrillation (CHADS2 Vasc score 1, on ASA). Patient began to have left-sided upper and lower extremity numbness while watching TV that started around 4:15 p.m. the patient had some difficulty ambulating per the wife. No palliating or provoking factors. No vision changes or speech changes. Patient had a previous stroke that was associated with dizziness, nausea, and gait abnormalities. He denies chest pain or shortness of breath. The patient came to emergency room for evaluation. While in the emergency room, the patient's symptoms had resolved.  MRI done and revealed scattered punctate acute infarctions within the cerebellum and both cerebral hemispheres consistent with micro embolic infarctions from the heart or ascending aorta.  Assessment & Plan:   1. Acute CVA - MRI reveals scattered punctate acute infarctions within the cerebellum in both cerebral hemispheres consistent with microembolic infarctions from the heart or ascending aorta. I have requested a neurology consult to assist with workup and management. I also discussed the results with the patient. The patient is insistent that he go home later today. He said that he would be willing to see the neurologist today but reports that he has an outpatient neurology appointment scheduled for later this week. There is a possibility he is going to sign out of the hospital AGAINST MEDICAL ADVICE. For antiplatelet therapy he is on Plavix which we will be continuing at this time. His echocardiogram was reviewed and normal systolic function and mild pulmonary hypertension. His lipids are optimally controlled with an LDL of 65. Carotid Doppler studies are pending at this time. We will  await the neurology recommendations. If patient decides to sign out AGAINST MEDICAL ADVICE he will be encouraged to follow-up with the outpatient neurologist and his primary care provider and cardiologist as soon as possible. PT was consult that and planning on seeing him tomorrow. He is ambulating very well. He passes swallow evaluation screen. His blood pressures are improving. He is strongly advised to stop smoking and using all tobacco products. The patient verbalized understanding.   DVT prophylaxis: Lovenox Consultants: None Code Status: Full code Family Communication: None  Disposition Plan: She should be able to return home.  Consultants:  Neurology  Procedures:  MRI/MRA  Subjective: The patient is upset because he wasn't allowed to eat overnight.  Objective: Vitals:   07/31/16 0200 07/31/16 0400 07/31/16 0600 07/31/16 0800  BP: (!) 144/78 129/65 134/64 129/86  Pulse: 60 64 63 (!) 58  Resp: 18 18 18 18   Temp: 98 F (36.7 C) 97.7 F (36.5 C) 97.6 F (36.4 C) 97.8 F (36.6 C)  TempSrc: Oral Oral Oral Oral  SpO2: 97% 98% 97%   Weight:      Height:       No intake or output data in the 24 hours ending 07/31/16 0833 Filed Weights   07/30/16 1721 07/30/16 2000  Weight: 81.6 kg (180 lb) 83.6 kg (184 lb 6.4 oz)    Exam:  General exam: Awake, alert, no apparent distress, no facial drooping or slurred speech noted. Respiratory system: Clear. No increased work of breathing. Cardiovascular system: S1 & S2 heard. No JVD, murmurs, gallops, clicks or pedal edema. Gastrointestinal system: Abdomen is nondistended, soft and nontender. Normal bowel sounds heard. Central nervous system: Alert  and oriented. No focal neurological deficits. Extremities: no CCE.  Data Reviewed: Basic Metabolic Panel:  Recent Labs Lab 07/30/16 1726 07/30/16 1733  NA 140 142  K 3.1* 3.1*  CL 107 105  CO2 25  --   GLUCOSE 100* 96  BUN 16 15  CREATININE 1.52* 1.50*  CALCIUM 9.2  --     Liver Function Tests:  Recent Labs Lab 07/30/16 1726  AST 20  ALT 23  ALKPHOS 140*  BILITOT 0.5  PROT 7.1  ALBUMIN 4.0   No results for input(s): LIPASE, AMYLASE in the last 168 hours. No results for input(s): AMMONIA in the last 168 hours. CBC:  Recent Labs Lab 07/30/16 1726 07/30/16 1733  WBC 11.1*  --   NEUTROABS 5.9  --   HGB 15.2 14.6  HCT 43.2 43.0  MCV 82.9  --   PLT 324  --    Cardiac Enzymes: No results for input(s): CKTOTAL, CKMB, CKMBINDEX, TROPONINI in the last 168 hours. CBG (last 3)   Recent Labs  07/30/16 1740 07/30/16 2256  GLUCAP 94 101*   No results found for this or any previous visit (from the past 240 hour(s)).   Studies: Ct Head Code Stroke W/o Cm  Result Date: 07/30/2016 CLINICAL DATA:  Code stroke.  Left arm and facial numbness. EXAM: CT HEAD WITHOUT CONTRAST TECHNIQUE: Contiguous axial images were obtained from the base of the skull through the vertex without intravenous contrast. COMPARISON:  None. FINDINGS: Brain: There is no evidence of acute cortical infarct, intracranial hemorrhage, mass, midline shift, or extra-axial fluid collection. There is encephalomalacia in the right occipital lobe consistent with a chronic infarct. Patchy cerebral white matter hypodensities bilaterally are nonspecific but compatible with moderate chronic small vessel ischemic disease. Lacunar infarcts in the body of the left caudate, left thalamus, and right lentiform nucleus are favored to be chronic. There is a 2 cm region of hypoattenuation involving the left caudate head and adjacent anterior limb of the internal capsule which is not clearly chronic. Vascular: Calcified atherosclerosis at the skullbase. No hyperdense vessel. Skull: No fracture or focal osseous lesion. Sinuses/Orbits: Visualized paranasal sinuses and mastoid air cells are clear. Prior bilateral cataract extraction. Other: None. ASPECTS (Alberta Stroke Program Early CT Score) with regards to the  right cerebral hemisphere: - Ganglionic level infarction (caudate, lentiform nuclei, internal capsule, insula, M1-M3 cortex): 7 - Supraganglionic infarction (M4-M6 cortex): 3 Total score (0-10 with 10 being normal): 10 IMPRESSION: 1. No acute intracranial hemorrhage or definite acute infarct to explain left-sided numbness. 2. ASPECTS is 10 with regards to the right cerebral hemisphere. 3. Age indeterminate, though potentially recent infarct in the left caudate head and internal capsule. 4. Additional bilateral deep gray nuclei lacunar infarcts which are favored to be chronic. 5. Chronic right occipital infarct. 6. Moderate chronic small vessel ischemic disease. These results were called by telephone at the time of interpretation on 07/30/2016 at 5:50 pm to Dr. Loren Racer , who verbally acknowledged these results. Electronically Signed   By: Sebastian Ache M.D.   On: 07/30/2016 17:52     Scheduled Meds: .  stroke: mapping our early stages of recovery book   Does not apply Once  . amLODipine  5 mg Oral Daily  . aspirin  300 mg Rectal Daily   Or  . aspirin  325 mg Oral Daily  . atorvastatin  80 mg Oral QHS  . busPIRone  10 mg Oral TID  . clopidogrel  75 mg Oral Daily  .  enoxaparin (LOVENOX) injection  40 mg Subcutaneous Q24H  . irbesartan  300 mg Oral Daily  . metoprolol succinate  50 mg Oral Daily  . mometasone-formoterol  2 puff Inhalation BID   Continuous Infusions:  Principal Problem:   TIA (transient ischemic attack) Active Problems:   CAD (coronary artery disease) of artery bypass graft   Chronic anticoagulation   Essential hypertension   COPD (chronic obstructive pulmonary disease) (HCC)   Paroxysmal atrial fibrillation (HCC)   Time spent:   Standley Dakins, MD, FAAFP [facility] Pager [phone]  If 7PM-7AM, please contact night-coverage [email or web address] Password [facility] 07/31/2016, 8:33 AM    LOS: 0 days

## 2016-08-01 LAB — HEMOGLOBIN A1C
Hgb A1c MFr Bld: 5.7 % — ABNORMAL HIGH (ref 4.8–5.6)
Mean Plasma Glucose: 117 mg/dL

## 2017-02-17 DIAGNOSIS — Z6824 Body mass index (BMI) 24.0-24.9, adult: Secondary | ICD-10-CM | POA: Diagnosis not present

## 2017-02-17 DIAGNOSIS — I10 Essential (primary) hypertension: Secondary | ICD-10-CM | POA: Diagnosis not present

## 2017-02-17 DIAGNOSIS — E782 Mixed hyperlipidemia: Secondary | ICD-10-CM | POA: Diagnosis not present

## 2017-02-17 DIAGNOSIS — F17200 Nicotine dependence, unspecified, uncomplicated: Secondary | ICD-10-CM | POA: Diagnosis not present

## 2017-02-17 DIAGNOSIS — I25810 Atherosclerosis of coronary artery bypass graft(s) without angina pectoris: Secondary | ICD-10-CM | POA: Diagnosis not present

## 2017-02-17 DIAGNOSIS — J449 Chronic obstructive pulmonary disease, unspecified: Secondary | ICD-10-CM | POA: Diagnosis not present

## 2017-02-17 DIAGNOSIS — F419 Anxiety disorder, unspecified: Secondary | ICD-10-CM | POA: Diagnosis not present

## 2017-05-31 DIAGNOSIS — E782 Mixed hyperlipidemia: Secondary | ICD-10-CM | POA: Diagnosis not present

## 2017-05-31 DIAGNOSIS — F17200 Nicotine dependence, unspecified, uncomplicated: Secondary | ICD-10-CM | POA: Diagnosis not present

## 2017-05-31 DIAGNOSIS — F419 Anxiety disorder, unspecified: Secondary | ICD-10-CM | POA: Diagnosis not present

## 2017-05-31 DIAGNOSIS — J449 Chronic obstructive pulmonary disease, unspecified: Secondary | ICD-10-CM | POA: Diagnosis not present

## 2017-05-31 DIAGNOSIS — I25810 Atherosclerosis of coronary artery bypass graft(s) without angina pectoris: Secondary | ICD-10-CM | POA: Diagnosis not present

## 2017-05-31 DIAGNOSIS — Z8673 Personal history of transient ischemic attack (TIA), and cerebral infarction without residual deficits: Secondary | ICD-10-CM | POA: Diagnosis not present

## 2017-05-31 DIAGNOSIS — I10 Essential (primary) hypertension: Secondary | ICD-10-CM | POA: Diagnosis not present

## 2017-06-19 DIAGNOSIS — J441 Chronic obstructive pulmonary disease with (acute) exacerbation: Secondary | ICD-10-CM | POA: Diagnosis not present

## 2017-06-19 DIAGNOSIS — F17210 Nicotine dependence, cigarettes, uncomplicated: Secondary | ICD-10-CM | POA: Diagnosis not present

## 2017-06-19 DIAGNOSIS — Z6825 Body mass index (BMI) 25.0-25.9, adult: Secondary | ICD-10-CM | POA: Diagnosis not present

## 2017-06-22 DIAGNOSIS — Z6824 Body mass index (BMI) 24.0-24.9, adult: Secondary | ICD-10-CM | POA: Diagnosis not present

## 2017-06-22 DIAGNOSIS — R630 Anorexia: Secondary | ICD-10-CM | POA: Diagnosis not present

## 2017-06-22 DIAGNOSIS — I25810 Atherosclerosis of coronary artery bypass graft(s) without angina pectoris: Secondary | ICD-10-CM | POA: Diagnosis not present

## 2017-06-22 DIAGNOSIS — F419 Anxiety disorder, unspecified: Secondary | ICD-10-CM | POA: Diagnosis not present

## 2017-06-22 DIAGNOSIS — Z8673 Personal history of transient ischemic attack (TIA), and cerebral infarction without residual deficits: Secondary | ICD-10-CM | POA: Diagnosis not present

## 2017-06-22 DIAGNOSIS — J449 Chronic obstructive pulmonary disease, unspecified: Secondary | ICD-10-CM | POA: Diagnosis not present

## 2017-06-22 DIAGNOSIS — F17210 Nicotine dependence, cigarettes, uncomplicated: Secondary | ICD-10-CM | POA: Diagnosis not present

## 2017-06-22 DIAGNOSIS — E782 Mixed hyperlipidemia: Secondary | ICD-10-CM | POA: Diagnosis not present

## 2017-06-22 DIAGNOSIS — F17200 Nicotine dependence, unspecified, uncomplicated: Secondary | ICD-10-CM | POA: Diagnosis not present

## 2017-06-22 DIAGNOSIS — R55 Syncope and collapse: Secondary | ICD-10-CM | POA: Diagnosis not present

## 2017-06-22 DIAGNOSIS — J441 Chronic obstructive pulmonary disease with (acute) exacerbation: Secondary | ICD-10-CM | POA: Diagnosis not present

## 2017-06-22 DIAGNOSIS — I10 Essential (primary) hypertension: Secondary | ICD-10-CM | POA: Diagnosis not present

## 2017-06-23 ENCOUNTER — Other Ambulatory Visit: Payer: Self-pay

## 2017-06-23 ENCOUNTER — Encounter (HOSPITAL_COMMUNITY): Payer: Self-pay | Admitting: Emergency Medicine

## 2017-06-23 ENCOUNTER — Emergency Department (HOSPITAL_COMMUNITY): Payer: Medicare HMO

## 2017-06-23 ENCOUNTER — Observation Stay (HOSPITAL_BASED_OUTPATIENT_CLINIC_OR_DEPARTMENT_OTHER)
Admission: EM | Admit: 2017-06-23 | Discharge: 2017-06-25 | Disposition: A | Discharge status: Home / Self Care | Payer: Medicare HMO | Source: Home / Self Care | Attending: Emergency Medicine | Admitting: Emergency Medicine

## 2017-06-23 DIAGNOSIS — R0902 Hypoxemia: Secondary | ICD-10-CM

## 2017-06-23 DIAGNOSIS — N179 Acute kidney failure, unspecified: Secondary | ICD-10-CM | POA: Diagnosis present

## 2017-06-23 DIAGNOSIS — Z87891 Personal history of nicotine dependence: Secondary | ICD-10-CM

## 2017-06-23 DIAGNOSIS — J449 Chronic obstructive pulmonary disease, unspecified: Secondary | ICD-10-CM | POA: Diagnosis not present

## 2017-06-23 DIAGNOSIS — Z4682 Encounter for fitting and adjustment of non-vascular catheter: Secondary | ICD-10-CM | POA: Diagnosis not present

## 2017-06-23 DIAGNOSIS — G9341 Metabolic encephalopathy: Secondary | ICD-10-CM | POA: Diagnosis present

## 2017-06-23 DIAGNOSIS — Z8673 Personal history of transient ischemic attack (TIA), and cerebral infarction without residual deficits: Secondary | ICD-10-CM | POA: Diagnosis not present

## 2017-06-23 DIAGNOSIS — Z452 Encounter for adjustment and management of vascular access device: Secondary | ICD-10-CM | POA: Diagnosis not present

## 2017-06-23 DIAGNOSIS — J90 Pleural effusion, not elsewhere classified: Secondary | ICD-10-CM | POA: Diagnosis not present

## 2017-06-23 DIAGNOSIS — R0602 Shortness of breath: Secondary | ICD-10-CM | POA: Diagnosis not present

## 2017-06-23 DIAGNOSIS — I4891 Unspecified atrial fibrillation: Secondary | ICD-10-CM | POA: Diagnosis present

## 2017-06-23 DIAGNOSIS — J440 Chronic obstructive pulmonary disease with acute lower respiratory infection: Secondary | ICD-10-CM | POA: Diagnosis present

## 2017-06-23 DIAGNOSIS — J441 Chronic obstructive pulmonary disease with (acute) exacerbation: Secondary | ICD-10-CM | POA: Diagnosis present

## 2017-06-23 DIAGNOSIS — I25810 Atherosclerosis of coronary artery bypass graft(s) without angina pectoris: Secondary | ICD-10-CM | POA: Diagnosis not present

## 2017-06-23 DIAGNOSIS — J9621 Acute and chronic respiratory failure with hypoxia: Secondary | ICD-10-CM | POA: Diagnosis not present

## 2017-06-23 DIAGNOSIS — I361 Nonrheumatic tricuspid (valve) insufficiency: Secondary | ICD-10-CM | POA: Diagnosis not present

## 2017-06-23 DIAGNOSIS — A419 Sepsis, unspecified organism: Secondary | ICD-10-CM | POA: Diagnosis present

## 2017-06-23 DIAGNOSIS — Z951 Presence of aortocoronary bypass graft: Secondary | ICD-10-CM

## 2017-06-23 DIAGNOSIS — J9601 Acute respiratory failure with hypoxia: Secondary | ICD-10-CM | POA: Diagnosis present

## 2017-06-23 DIAGNOSIS — R531 Weakness: Secondary | ICD-10-CM | POA: Diagnosis not present

## 2017-06-23 DIAGNOSIS — J181 Lobar pneumonia, unspecified organism: Secondary | ICD-10-CM | POA: Diagnosis not present

## 2017-06-23 DIAGNOSIS — Z8249 Family history of ischemic heart disease and other diseases of the circulatory system: Secondary | ICD-10-CM | POA: Diagnosis not present

## 2017-06-23 DIAGNOSIS — E860 Dehydration: Secondary | ICD-10-CM | POA: Diagnosis present

## 2017-06-23 DIAGNOSIS — I10 Essential (primary) hypertension: Secondary | ICD-10-CM

## 2017-06-23 DIAGNOSIS — E871 Hypo-osmolality and hyponatremia: Secondary | ICD-10-CM | POA: Diagnosis present

## 2017-06-23 DIAGNOSIS — I252 Old myocardial infarction: Secondary | ICD-10-CM | POA: Diagnosis not present

## 2017-06-23 DIAGNOSIS — R197 Diarrhea, unspecified: Secondary | ICD-10-CM | POA: Insufficient documentation

## 2017-06-23 DIAGNOSIS — R630 Anorexia: Secondary | ICD-10-CM | POA: Insufficient documentation

## 2017-06-23 DIAGNOSIS — I2510 Atherosclerotic heart disease of native coronary artery without angina pectoris: Secondary | ICD-10-CM

## 2017-06-23 DIAGNOSIS — Z515 Encounter for palliative care: Secondary | ICD-10-CM | POA: Diagnosis not present

## 2017-06-23 DIAGNOSIS — Z7901 Long term (current) use of anticoagulants: Secondary | ICD-10-CM

## 2017-06-23 DIAGNOSIS — R110 Nausea: Secondary | ICD-10-CM | POA: Insufficient documentation

## 2017-06-23 DIAGNOSIS — Z7189 Other specified counseling: Secondary | ICD-10-CM | POA: Diagnosis not present

## 2017-06-23 DIAGNOSIS — R55 Syncope and collapse: Secondary | ICD-10-CM | POA: Diagnosis present

## 2017-06-23 DIAGNOSIS — Z9981 Dependence on supplemental oxygen: Secondary | ICD-10-CM | POA: Diagnosis not present

## 2017-06-23 DIAGNOSIS — Z803 Family history of malignant neoplasm of breast: Secondary | ICD-10-CM | POA: Diagnosis not present

## 2017-06-23 DIAGNOSIS — R6521 Severe sepsis with septic shock: Secondary | ICD-10-CM | POA: Diagnosis present

## 2017-06-23 DIAGNOSIS — I951 Orthostatic hypotension: Secondary | ICD-10-CM | POA: Diagnosis present

## 2017-06-23 DIAGNOSIS — L98429 Non-pressure chronic ulcer of back with unspecified severity: Secondary | ICD-10-CM | POA: Diagnosis not present

## 2017-06-23 DIAGNOSIS — E870 Hyperosmolality and hypernatremia: Secondary | ICD-10-CM | POA: Diagnosis not present

## 2017-06-23 DIAGNOSIS — I469 Cardiac arrest, cause unspecified: Secondary | ICD-10-CM | POA: Diagnosis not present

## 2017-06-23 DIAGNOSIS — R739 Hyperglycemia, unspecified: Secondary | ICD-10-CM | POA: Diagnosis not present

## 2017-06-23 DIAGNOSIS — Y95 Nosocomial condition: Secondary | ICD-10-CM | POA: Diagnosis not present

## 2017-06-23 DIAGNOSIS — I480 Paroxysmal atrial fibrillation: Secondary | ICD-10-CM | POA: Diagnosis present

## 2017-06-23 DIAGNOSIS — J9690 Respiratory failure, unspecified, unspecified whether with hypoxia or hypercapnia: Secondary | ICD-10-CM | POA: Diagnosis not present

## 2017-06-23 DIAGNOSIS — R0600 Dyspnea, unspecified: Secondary | ICD-10-CM | POA: Diagnosis not present

## 2017-06-23 DIAGNOSIS — E876 Hypokalemia: Secondary | ICD-10-CM | POA: Diagnosis not present

## 2017-06-23 DIAGNOSIS — R918 Other nonspecific abnormal finding of lung field: Secondary | ICD-10-CM | POA: Diagnosis not present

## 2017-06-23 DIAGNOSIS — J80 Acute respiratory distress syndrome: Secondary | ICD-10-CM | POA: Diagnosis present

## 2017-06-23 DIAGNOSIS — Z79899 Other long term (current) drug therapy: Secondary | ICD-10-CM | POA: Insufficient documentation

## 2017-06-23 DIAGNOSIS — J9600 Acute respiratory failure, unspecified whether with hypoxia or hypercapnia: Secondary | ICD-10-CM | POA: Diagnosis not present

## 2017-06-23 DIAGNOSIS — Z841 Family history of disorders of kidney and ureter: Secondary | ICD-10-CM | POA: Diagnosis not present

## 2017-06-23 DIAGNOSIS — J189 Pneumonia, unspecified organism: Secondary | ICD-10-CM | POA: Diagnosis present

## 2017-06-23 DIAGNOSIS — I219 Acute myocardial infarction, unspecified: Secondary | ICD-10-CM

## 2017-06-23 DIAGNOSIS — R0603 Acute respiratory distress: Secondary | ICD-10-CM | POA: Diagnosis not present

## 2017-06-23 LAB — COMPREHENSIVE METABOLIC PANEL
ALK PHOS: 97 U/L (ref 38–126)
ALT: 28 U/L (ref 17–63)
AST: 45 U/L — AB (ref 15–41)
Albumin: 3.5 g/dL (ref 3.5–5.0)
Anion gap: 12 (ref 5–15)
BILIRUBIN TOTAL: 0.9 mg/dL (ref 0.3–1.2)
BUN: 55 mg/dL — AB (ref 6–20)
CALCIUM: 8.4 mg/dL — AB (ref 8.9–10.3)
CO2: 13 mmol/L — ABNORMAL LOW (ref 22–32)
CREATININE: 2.7 mg/dL — AB (ref 0.61–1.24)
Chloride: 103 mmol/L (ref 101–111)
GFR calc Af Amer: 26 mL/min — ABNORMAL LOW (ref >60)
GFR, EST NON AFRICAN AMERICAN: 23 mL/min — AB (ref >60)
Glucose, Bld: 109 mg/dL — ABNORMAL HIGH (ref 65–99)
Potassium: 3.5 mmol/L (ref 3.5–5.1)
Sodium: 128 mmol/L — ABNORMAL LOW (ref 135–145)
TOTAL PROTEIN: 7 g/dL (ref 6.5–8.1)

## 2017-06-23 LAB — CBC WITH DIFFERENTIAL/PLATELET
Basophils Absolute: 0 10*3/uL (ref 0.0–0.1)
Basophils Relative: 0 %
EOS ABS: 0 10*3/uL (ref 0.0–0.7)
EOS PCT: 0 %
HCT: 46.5 % (ref 39.0–52.0)
Hemoglobin: 16.3 g/dL (ref 13.0–17.0)
Lymphocytes Relative: 26 %
Lymphs Abs: 1.1 10*3/uL (ref 0.7–4.0)
MCH: 28.7 pg (ref 26.0–34.0)
MCHC: 35.1 g/dL (ref 30.0–36.0)
MCV: 82 fL (ref 78.0–100.0)
Monocytes Absolute: 0.3 10*3/uL (ref 0.1–1.0)
Monocytes Relative: 8 %
Neutro Abs: 3 10*3/uL (ref 1.7–7.7)
Neutrophils Relative %: 66 %
PLATELETS: 163 10*3/uL (ref 150–400)
RBC: 5.67 MIL/uL (ref 4.22–5.81)
RDW: 14.6 % (ref 11.5–15.5)
WBC: 4.5 10*3/uL (ref 4.0–10.5)

## 2017-06-23 LAB — TROPONIN I
TROPONIN I: 0.03 ng/mL — AB (ref <0.03)
Troponin I: 0.03 ng/mL (ref <0.03)

## 2017-06-23 LAB — BRAIN NATRIURETIC PEPTIDE: B Natriuretic Peptide: 55 pg/mL (ref 0.0–100.0)

## 2017-06-23 LAB — I-STAT CG4 LACTIC ACID, ED: Lactic Acid, Venous: 0.71 mmol/L (ref 0.5–1.9)

## 2017-06-23 LAB — D-DIMER, QUANTITATIVE: D-Dimer, Quant: 2.21 ug/mL-FEU — ABNORMAL HIGH (ref 0.00–0.50)

## 2017-06-23 MED ORDER — METHYLPREDNISOLONE SODIUM SUCC 125 MG IJ SOLR
80.0000 mg | Freq: Two times a day (BID) | INTRAMUSCULAR | Status: DC
  Administered 2017-06-24 – 2017-06-25 (×3): 80 mg via INTRAVENOUS
  Filled 2017-06-23 (×3): qty 2

## 2017-06-23 MED ORDER — TECHNETIUM TC 99M DIETHYLENETRIAME-PENTAACETIC ACID
30.0000 | Freq: Once | INTRAVENOUS | Status: AC | PRN
  Administered 2017-06-23: 30 via RESPIRATORY_TRACT

## 2017-06-23 MED ORDER — IPRATROPIUM BROMIDE 0.02 % IN SOLN: 0.5000 mg | Freq: Four times a day (QID) | RESPIRATORY_TRACT | Status: DC

## 2017-06-23 MED ORDER — IPRATROPIUM-ALBUTEROL 0.5-2.5 (3) MG/3ML IN SOLN
3.0000 mL | Freq: Three times a day (TID) | RESPIRATORY_TRACT | Status: DC
  Administered 2017-06-24 – 2017-06-25 (×5): 3 mL via RESPIRATORY_TRACT
  Filled 2017-06-23 (×6): qty 3

## 2017-06-23 MED ORDER — SODIUM CHLORIDE 0.9 % IV BOLUS (SEPSIS)
1000.0000 mL | Freq: Once | INTRAVENOUS | Status: AC
  Administered 2017-06-23: 1000 mL via INTRAVENOUS

## 2017-06-23 MED ORDER — ASPIRIN EC 81 MG PO TBEC
81.0000 mg | DELAYED_RELEASE_TABLET | Freq: Every day | ORAL | Status: DC
  Administered 2017-06-23 – 2017-06-25 (×3): 81 mg via ORAL
  Filled 2017-06-23 (×3): qty 1

## 2017-06-23 MED ORDER — SODIUM CHLORIDE 0.9 % IV SOLN
INTRAVENOUS | Status: AC
  Administered 2017-06-23: 23:00:00 via INTRAVENOUS

## 2017-06-23 MED ORDER — AZITHROMYCIN 250 MG PO TABS
250.0000 mg | ORAL_TABLET | Freq: Every day | ORAL | Status: DC
  Administered 2017-06-24 – 2017-06-25 (×2): 250 mg via ORAL
  Filled 2017-06-23 (×2): qty 1

## 2017-06-23 MED ORDER — MELATONIN 3 MG PO TABS
6.0000 mg | ORAL_TABLET | Freq: Every day | ORAL | Status: DC
  Administered 2017-06-23 – 2017-06-24 (×2): 6 mg via ORAL
  Filled 2017-06-23 (×3): qty 2

## 2017-06-23 MED ORDER — IPRATROPIUM-ALBUTEROL 0.5-2.5 (3) MG/3ML IN SOLN
3.0000 mL | Freq: Four times a day (QID) | RESPIRATORY_TRACT | Status: DC
  Administered 2017-06-23: 3 mL via RESPIRATORY_TRACT
  Filled 2017-06-23: qty 3

## 2017-06-23 MED ORDER — TECHNETIUM TO 99M ALBUMIN AGGREGATED
4.0000 | Freq: Once | INTRAVENOUS | Status: AC | PRN
  Administered 2017-06-23: 4 via INTRAVENOUS

## 2017-06-23 MED ORDER — ALBUTEROL SULFATE (2.5 MG/3ML) 0.083% IN NEBU: 2.5000 mg | INHALATION_SOLUTION | Freq: Four times a day (QID) | RESPIRATORY_TRACT | Status: DC

## 2017-06-23 MED ORDER — ALBUTEROL SULFATE (2.5 MG/3ML) 0.083% IN NEBU
5.0000 mg | INHALATION_SOLUTION | Freq: Once | RESPIRATORY_TRACT | Status: AC
  Administered 2017-06-23: 5 mg via RESPIRATORY_TRACT
  Filled 2017-06-23: qty 6

## 2017-06-23 MED ORDER — ALBUTEROL SULFATE (2.5 MG/3ML) 0.083% IN NEBU
2.5000 mg | INHALATION_SOLUTION | RESPIRATORY_TRACT | Status: DC | PRN
Start: 2017-06-23 — End: 2017-06-25

## 2017-06-23 MED ORDER — METHYLPREDNISOLONE SODIUM SUCC 125 MG IJ SOLR
125.0000 mg | Freq: Once | INTRAMUSCULAR | Status: AC
  Administered 2017-06-23: 125 mg via INTRAVENOUS
  Filled 2017-06-23: qty 2

## 2017-06-23 MED ORDER — AZITHROMYCIN 250 MG PO TABS
500.0000 mg | ORAL_TABLET | Freq: Every day | ORAL | Status: AC
  Administered 2017-06-23: 500 mg via ORAL
  Filled 2017-06-23: qty 2

## 2017-06-23 NOTE — H&P (Signed)
History and Physical    Darryl George WUJ:[id number] DOB: June 24, 1950 DOA: 06/23/2017  PCP: System, Provider Not In  Patient coming from: Home  Chief Complaint: Shortness of breath, wheezing, passing out  HPI: Darryl George is a 67 y.o. male with medical history significant of COPD, hypertension comes in with several days of shortness of breath and wheezing with coughing.  He denies any fevers or chills.  He states at least 3 times over the last 2 or 3 days he has had syncopal episodes where twice have been when he is standing where he just passes out.  He denies any chest pain with these events.  He denies any lower extremity edema or swelling.  Patient is not eating or drinking very well in the last several days due to his coughing and respiratory issues.  Today he is found to be significantly orthostatic with acute kidney injury with a creatinine bumped up to 2.7.  Patient is being referred for admission for AK I and dehydration along with orthostatic syncope.  Review of Systems: As per HPI otherwise 10 point review of systems negative.   Past Medical History:  Diagnosis Date  . Back pain   . COPD (chronic obstructive pulmonary disease) (HCC)   . Hypertension   . Stroke [facility])    Noted on MRI scan performed at [facility]  . Tobacco abuse     Past Surgical History:  Procedure Laterality Date  . CARDIAC CATHETERIZATION N/A 01/23/2016   Procedure: Left Heart Cath and Coronary Angiography;  Surgeon: Lyn Records, MD;  Location: [facility] INVASIVE CV LAB;  Service: Cardiovascular;  Laterality: N/A;  . CORONARY ARTERY BYPASS GRAFT N/A 01/25/2016   Procedure: CORONARY ARTERY BYPASS GRAFTING (CABG) times five using left internal mammary artery and right saphenous vein.;  Surgeon: Kerin Perna, MD;  Location: [facility] OR;  Service: Open Heart Surgery;  Laterality: N/A;  . EYE SURGERY    . HERNIA REPAIR    . TEE WITHOUT CARDIOVERSION  01/25/2016   Procedure: TRANSESOPHAGEAL ECHOCARDIOGRAM (TEE);  Surgeon:  Kerin Perna, MD;  Location: [facility] OR;  Service: Open Heart Surgery;;     reports that he has quit smoking. His smoking use included cigarettes. He smoked 1.00 pack per day. he has never used smokeless tobacco. He reports that he does not drink alcohol or use drugs.  No Known Allergies  Family History  Problem Relation Age of Onset  . Cancer Mother   . Heart failure Mother   . Kidney disease Mother   . Aneurysm Father   . Hypertension Father   . Cancer Other   . Breast cancer Sister     Prior to Admission medications   Medication Sig Start Date End Date Taking? Authorizing Provider  albuterol (PROVENTIL HFA;VENTOLIN HFA) 108 (90 Base) MCG/ACT inhaler Inhale 2 puffs into the lungs every 6 (six) hours as needed for wheezing or shortness of breath.   Yes [provider]  amLODipine (NORVASC) 5 MG tablet Take 5 mg by mouth daily.   Yes [provider]  atorvastatin (LIPITOR) 80 MG tablet Take 1 tablet (80 mg total) by mouth daily at 6 PM. Patient taking differently: Take 80 mg by mouth at bedtime.  02/11/16  Yes Leone Brand, NP  Melatonin 3 MG TABS Take 6 mg by mouth at bedtime.   Yes [provider]  metoprolol succinate (TOPROL-XL) 100 MG 24 hr tablet Take 50 mg by mouth daily. Take with or immediately following  a meal.    Yes [provider]  valsartan (DIOVAN) 320 MG tablet Take 320 mg by mouth daily.   Yes [provider]    Physical Exam: Vitals:   06/23/17 1730 06/23/17 1738 06/23/17 1838 06/23/17 1900  BP: 109/68  (!) 106/54 111/67  Pulse: 72  (!) 58 86  Resp: (!) 24  (!) 21   SpO2: 93% 94% (!) 86%       Constitutional: NAD, calm, comfortable Vitals:   06/23/17 1730 06/23/17 1738 06/23/17 1838 06/23/17 1900  BP: 109/68  (!) 106/54 111/67  Pulse: 72  (!) 58 86  Resp: (!) 24  (!) 21   SpO2: 93% 94% (!) 86%    Eyes: PERRL, lids and conjunctivae normal ENMT: Mucous membranes are dry. Posterior pharynx clear of any  exudate or lesions.Normal dentition.  Neck: normal, supple, no masses, no thyromegaly Respiratory: clear to auscultation bilaterally, mild expiratory wheezing, no crackles. Normal respiratory effort. No accessory muscle use.  Cardiovascular: Regular rate and rhythm, no murmurs / rubs / gallops. No extremity edema. 2+ pedal pulses. No carotid bruits.  Abdomen: no tenderness, no masses palpated. No hepatosplenomegaly. Bowel sounds positive.  Musculoskeletal: no clubbing / cyanosis. No joint deformity upper and lower extremities. Good ROM, no contractures. Normal muscle tone.  Skin: no rashes, lesions, ulcers. No induration Neurologic: CN 2-12 grossly intact. Sensation intact, DTR normal. Strength 5/5 in all 4.  Psychiatric: Normal judgment and insight. Alert and oriented x 3. Normal mood.    Labs on Admission: I have personally reviewed following labs and imaging studies  CBC: Recent Labs  Lab 06/23/17 1322  WBC 4.5  NEUTROABS 3.0  HGB 16.3  HCT 46.5  MCV 82.0  PLT 163   Basic Metabolic Panel: Recent Labs  Lab 06/23/17 1322  NA 128*  K 3.5  CL 103  CO2 13*  GLUCOSE 109*  BUN 55*  CREATININE 2.70*  CALCIUM 8.4*   GFR: CrCl cannot be calculated (Unknown ideal weight.). Liver Function Tests: Recent Labs  Lab 06/23/17 1322  AST 45*  ALT 28  ALKPHOS 97  BILITOT 0.9  PROT 7.0  ALBUMIN 3.5   No results for input(s): LIPASE, AMYLASE in the last 168 hours. No results for input(s): AMMONIA in the last 168 hours. Coagulation Profile: No results for input(s): INR, PROTIME in the last 168 hours. Cardiac Enzymes: Recent Labs  Lab 06/23/17 1322 06/23/17 1807  TROPONINI 0.03* 0.03*   BNP (last 3 results) No results for input(s): PROBNP in the last 8760 hours. HbA1C: No results for input(s): HGBA1C in the last 72 hours. CBG: No results for input(s): GLUCAP in the last 168 hours. Lipid Profile: No results for input(s): CHOL, HDL, LDLCALC, TRIG, CHOLHDL, LDLDIRECT in  the last 72 hours. Thyroid Function Tests: No results for input(s): TSH, T4TOTAL, FREET4, T3FREE, THYROIDAB in the last 72 hours. Anemia Panel: No results for input(s): VITAMINB12, FOLATE, FERRITIN, TIBC, IRON, RETICCTPCT in the last 72 hours. Urine analysis:    Component Value Date/Time   COLORURINE STRAW (A) 07/30/2016 1728   APPEARANCEUR CLEAR 07/30/2016 1728   LABSPEC 1.005 07/30/2016 1728   PHURINE 6.0 07/30/2016 1728   GLUCOSEU NEGATIVE 07/30/2016 1728   HGBUR SMALL (A) 07/30/2016 1728   BILIRUBINUR NEGATIVE 07/30/2016 1728   KETONESUR NEGATIVE 07/30/2016 1728   PROTEINUR NEGATIVE 07/30/2016 1728   NITRITE NEGATIVE 07/30/2016 1728   LEUKOCYTESUR NEGATIVE 07/30/2016 1728   Sepsis Labs: !!!!!!!!!!!!!!!!!!!!!!!!!!!!!!!!!!!!!!!!!!!! @LABRCNTIP (procalcitonin:4,lacticidven:4) )No results found for this or any previous  visit (from the past 240 hour(s)).   Radiological Exams on Admission: Dg Chest 2 View  Result Date: 06/23/2017 CLINICAL DATA:  Shortness of breath, weakness. EXAM: CHEST  2 VIEW COMPARISON:  Chest x-rays dated 02/29/2016 and 01/24/2016. FINDINGS: Heart size and mediastinal contours are stable. Subtle opacities within the left mid and lower lung zones, slightly more prominent than on previous exams. Right lung is clear. No pleural effusion or pneumothorax seen. No acute or suspicious osseous finding. IMPRESSION: Subtle opacities within the left mid and lower lung zones, slightly more prominent than on previous exams suggesting recurrent mild edema or developing pneumonia. Favor mild edema superimposed on chronic scarring/fibrosis if no fever. Electronically Signed   By: Bary Richard M.D.   On: 06/23/2017 14:07   Nm Pulmonary Vent And Perf (v/q Scan)  Result Date: 06/23/2017 CLINICAL DATA:  Shortness of breath and weakness. EXAM: NUCLEAR MEDICINE VENTILATION - PERFUSION LUNG SCAN TECHNIQUE: Ventilation images were obtained in multiple projections using inhaled aerosol Tc-39m  DTPA. Perfusion images were obtained in multiple projections after intravenous injection of Tc-36m MAA. RADIOPHARMACEUTICALS:  32.0 mCi of Tc-64m DTPA aerosol inhalation and 4.4 mCi Tc39m MAA-IV COMPARISON:  Chest x-ray June 23, 2017 FINDINGS: Multiple matched defects are identified, worse on ventilation and perfusion. No unmatched defects. IMPRESSION: Very low probability V/Q scan.  No evidence of pulmonary embolus. Electronically Signed   By: Gerome Sam III M.D   On: 06/23/2017 17:15    EKG: Independently reviewed.  Normal sinus rhythm no acute changes Old chart reviewed Case discussed with EDP Chest x-ray reviewed possible edema versus infiltrate her chest x-ray  Assessment/Plan 67 year old male with orthostatic hypotension, acute kidney injury, and COPD exacerbation Principal Problem:   AKI (acute kidney injury) (HCC)-creatinine usually normal up to 2.7.  Patient on multiple blood pressure agents.  Placed on IV fluids give second liter of IV fluid bolus in the ED now.  Placed on normal saline at 100 cc an hour repeat orthostatics every shift.  If his systolic blood pressure drops into the 70s upon standing.  Hold NSAIDs, hold beta-blocker Norvasc and ARB.  Prerenal azotemia plus or minus ATN.  Active Problems:   COPD with acute exacerbation (HCC)-placed on Z-Pak.  We will also placed on IV Solu-Medrol and frequent bronchodilators.  Supplemental oxygen as needed.    Dehydration-IV fluids as above    Syncope due to orthostatic hypotension-IV fluids as above repeat orthostatics every shift until normalized we will monitor on telemetry monitoring    CAD (coronary artery disease) of artery bypass graft-stable    Essential hypertension-holding all blood pressure agents at this time    Paroxysmal atrial fibrillation (HCC)-holding beta-blocker at this time due to the above      DVT prophylaxis: SCDs Code Status: Full Family Communication: None Disposition Plan: Per day  team Consults called: None Admission status: Observation   Djuana Littleton A MD [facility]  If 7PM-7AM, please contact night-coverage [email or web address] Password [facility]  06/23/2017, 7:14 PM

## 2017-06-23 NOTE — ED Provider Notes (Signed)
[facility] EMERGENCY DEPARTMENT Provider Note   CSN: [id number] Arrival date & time: 06/23/17  1256     History   Chief Complaint Chief Complaint  Patient presents with  . Weakness    HPI Darryl George is a 67 y.o. male.  HPI  Patient presents with his wife due to concerns of weakness. Onset was about 1 week ago, and after symptoms began the patient saw his physician twice. He has been taking medication for cough relief, but notes that in spite of taking this new medication has progressive weakness, anorexia, nausea, diarrhea, and feels weak and dehydrated. No focal pain.  No fever.  No confusion or disorientation. He notes that he was well prior to the onset of symptoms, though he does have a history of COPD, is a longtime smoker.  He does not wear home oxygen.  Past Medical History:  Diagnosis Date  . Back pain   . COPD (chronic obstructive pulmonary disease) (HCC)   . Hypertension   . Stroke [facility])    Noted on MRI scan performed at [facility]  . Tobacco abuse     Patient Active Problem List   Diagnosis Date Noted  . TIA (transient ischemic attack) 07/30/2016  . Paroxysmal atrial fibrillation (HCC) 01/28/2016  . Essential hypertension   . COPD (chronic obstructive pulmonary disease) (HCC)   . Stroke (HCC)   . Tobacco abuse   . CAD (coronary artery disease) of artery bypass graft 01/23/2016  . Chronic anticoagulation 01/23/2016    Past Surgical History:  Procedure Laterality Date  . CARDIAC CATHETERIZATION N/A 01/23/2016   Procedure: Left Heart Cath and Coronary Angiography;  Surgeon: Lyn Records, MD;  Location: [facility] INVASIVE CV LAB;  Service: Cardiovascular;  Laterality: N/A;  . CORONARY ARTERY BYPASS GRAFT N/A 01/25/2016   Procedure: CORONARY ARTERY BYPASS GRAFTING (CABG) times five using left internal mammary artery and right saphenous vein.;  Surgeon: Kerin Perna, MD;  Location: [facility] OR;  Service: Open Heart Surgery;  Laterality: N/A;  . EYE SURGERY    .  HERNIA REPAIR    . TEE WITHOUT CARDIOVERSION  01/25/2016   Procedure: TRANSESOPHAGEAL ECHOCARDIOGRAM (TEE);  Surgeon: Kerin Perna, MD;  Location: [facility] OR;  Service: Open Heart Surgery;;       Home Medications    Prior to Admission medications   Medication Sig Start Date End Date Taking? Authorizing Provider  albuterol (PROVENTIL HFA;VENTOLIN HFA) 108 (90 Base) MCG/ACT inhaler Inhale 2 puffs into the lungs every 6 (six) hours as needed for wheezing or shortness of breath.   Yes [provider]  amLODipine (NORVASC) 5 MG tablet Take 5 mg by mouth daily.   Yes [provider]  atorvastatin (LIPITOR) 80 MG tablet Take 1 tablet (80 mg total) by mouth daily at 6 PM. Patient taking differently: Take 80 mg by mouth at bedtime.  02/11/16  Yes Leone Brand, NP  Melatonin 3 MG TABS Take 6 mg by mouth at bedtime.   Yes [provider]  metoprolol succinate (TOPROL-XL) 100 MG 24 hr tablet Take 50 mg by mouth daily. Take with or immediately following a meal.    Yes [provider]  valsartan (DIOVAN) 320 MG tablet Take 320 mg by mouth daily.   Yes [provider]    Family History Family History  Problem Relation Age of Onset  . Cancer Mother   . Heart failure Mother   . Kidney disease Mother   . Aneurysm Father   .  Hypertension Father   . Cancer Other   . Breast cancer Sister     Social History Social History   Tobacco Use  . Smoking status: Former Smoker    Packs/day: 1.00    Types: Cigarettes  . Smokeless tobacco: Never Used  Substance Use Topics  . Alcohol use: No    Comment: once a month  . Drug use: No     Allergies   Patient has no known allergies.   Review of Systems Review of Systems  Constitutional:       Per HPI, otherwise negative  HENT:       Per HPI, otherwise negative  Respiratory:       Per HPI, otherwise negative  Cardiovascular:       Per HPI, otherwise negative  Gastrointestinal: Positive for nausea.  Negative for vomiting.  Endocrine:       Negative aside from HPI  Genitourinary:       Neg aside from HPI   Musculoskeletal:       Per HPI, otherwise negative  Skin: Negative.   Neurological: Negative for syncope.     Physical Exam Updated Vital Signs BP 109/68   Pulse 72   Resp (!) 24   SpO2 94%   Physical Exam  Constitutional: He is oriented to person, place, and time. He has a sickly appearance. No distress.  HENT:  Head: Normocephalic and atraumatic.  Eyes: Conjunctivae and EOM are normal.  Cardiovascular: Normal rate and regular rhythm.  Pulmonary/Chest: No stridor. He has decreased breath sounds.  Abdominal: He exhibits no distension.  Musculoskeletal: He exhibits no edema.  Neurological: He is alert and oriented to person, place, and time.  Skin: Skin is warm and dry.  Psychiatric: He has a normal mood and affect.  Nursing note and vitals reviewed.    ED Treatments / Results  Labs (all labs ordered are listed, but only abnormal results are displayed) Labs Reviewed  COMPREHENSIVE METABOLIC PANEL - Abnormal; Notable for the following components:      Result Value   Sodium 128 (*)    CO2 13 (*)    Glucose, Bld 109 (*)    BUN 55 (*)    Creatinine, Ser 2.70 (*)    Calcium 8.4 (*)    AST 45 (*)    GFR calc non Af Amer 23 (*)    GFR calc Af Amer 26 (*)    All other components within normal limits  TROPONIN I - Abnormal; Notable for the following components:   Troponin I 0.03 (*)    All other components within normal limits  D-DIMER, QUANTITATIVE (NOT AT [facility]) - Abnormal; Notable for the following components:   D-Dimer, Quant 2.21 (*)    All other components within normal limits  CBC WITH DIFFERENTIAL/PLATELET  BRAIN NATRIURETIC PEPTIDE  I-STAT CG4 LACTIC ACID, ED    EKG  EKG Interpretation  Date/Time:  Friday June 23 2017 13:14:09 EST Ventricular Rate:  79 PR Interval:    QRS Duration: 99 QT Interval:  391 QTC Calculation: 449 R  Axis:   -8 Text Interpretation:  Sinus rhythm Consider left atrial enlargement Abnormal R-wave progression, late transition Borderline T abnormalities, anterior leads Baseline wander in lead(s) V3 Abnormal ekg Confirmed by Gerhard Munch [phone]) on 06/23/2017 1:24:19 PM       Radiology Dg Chest 2 View  Result Date: 06/23/2017 CLINICAL DATA:  Shortness of breath, weakness. EXAM: CHEST  2 VIEW COMPARISON:  Chest x-rays dated 02/29/2016 and  01/24/2016. FINDINGS: Heart size and mediastinal contours are stable. Subtle opacities within the left mid and lower lung zones, slightly more prominent than on previous exams. Right lung is clear. No pleural effusion or pneumothorax seen. No acute or suspicious osseous finding. IMPRESSION: Subtle opacities within the left mid and lower lung zones, slightly more prominent than on previous exams suggesting recurrent mild edema or developing pneumonia. Favor mild edema superimposed on chronic scarring/fibrosis if no fever. Electronically Signed   By: Bary Richard M.D.   On: 06/23/2017 14:07   Nm Pulmonary Vent And Perf (v/q Scan)  Result Date: 06/23/2017 CLINICAL DATA:  Shortness of breath and weakness. EXAM: NUCLEAR MEDICINE VENTILATION - PERFUSION LUNG SCAN TECHNIQUE: Ventilation images were obtained in multiple projections using inhaled aerosol Tc-65m DTPA. Perfusion images were obtained in multiple projections after intravenous injection of Tc-31m MAA. RADIOPHARMACEUTICALS:  32.0 mCi of Tc-20m DTPA aerosol inhalation and 4.4 mCi Tc13m MAA-IV COMPARISON:  Chest x-ray June 23, 2017 FINDINGS: Multiple matched defects are identified, worse on ventilation and perfusion. No unmatched defects. IMPRESSION: Very low probability V/Q scan.  No evidence of pulmonary embolus. Electronically Signed   By: Gerome Sam III M.D   On: 06/23/2017 17:15    Procedures Procedures (including critical care time)  Medications Ordered in ED Medications  sodium chloride 0.9 % bolus  1,000 mL (0 mLs Intravenous Stopped 06/23/17 1623)  technetium TC 18M diethylenetriame-pentaacetic acid (DTPA) injection 30 millicurie (30 millicuries Inhalation Given 06/23/17 1630)  technetium albumin aggregated (MAA) injection solution 4 millicurie (4 millicuries Intravenous Contrast Given 06/23/17 1650)  methylPREDNISolone sodium succinate (SOLU-MEDROL) 125 mg/2 mL injection 125 mg (125 mg Intravenous Given 06/23/17 1736)  albuterol (PROVENTIL) (2.5 MG/3ML) 0.083% nebulizer solution 5 mg (5 mg Nebulization Given 06/23/17 1738)     Initial Impression / Assessment and Plan / ED Course  I have reviewed the triage vital signs and the nursing notes.  Pertinent labs & imaging results that were available during my care of the patient were reviewed by me and considered in my medical decision making (see chart for details).   After the initial evaluation patient was placed on supplemental oxygen, given room air hypoxia, 87%. On 4 L nasal cannula patient has oxygen saturation 91%, still abnormal, but acceptable for a patient with COPD.     Update:, Patient remains dyspneic. With positive d-dimer, but also evidence for acute kidney injury, patient will require VQ scan to exclude pulmonary embolism.   6:02 PM Patient and wife aware of all findings including evidence for acute kidney injury, as well as pulmonary disease. No obvious evidence for pneumonia, with no cough, no fever, no leukocytosis or x-ray evidence for this. Symptoms likely secondary to combination of COPD exacerbation with intrinsic pulmonary disease as well as acute kidney injury. Patient has received multiple fluid boluses, steroids, breathing treatments, and his oxygen dependency is persistent, though now on 2 L rather than 4 to maintain saturation greater than 93%.  Patient will be admitted for further evaluation and management.  Final Clinical Impressions(s) / ED Diagnoses   Final diagnoses:  Dehydration  AKI (acute kidney injury)  (HCC)  Hypoxia  COPD exacerbation    Gerhard Munch, MD 06/23/17 [id number]

## 2017-06-23 NOTE — ED Triage Notes (Signed)
Pt was seen at PCP for weakness and SOB. Pt states " I took the steriods and some pills but they didn't help" pt states he he "fell out" yesterday.  Family member said that today he "fel out" again and had a gazed look in his eye.  Pt c/o of weakness and diarrhea today.  Denies hitting head or fever.

## 2017-06-23 NOTE — ED Notes (Signed)
CRITICAL VALUE ALERT  Critical Value:  Trop 0.03  Date & Time Notied:  06/23/17, 1422  Provider Notified: Dr. Jeraldine Loots  Orders Received/Actions taken: no new orders

## 2017-06-23 NOTE — ED Notes (Signed)
Pt placed on NonRebreather at 7L O2. O2 Sats now 91%.

## 2017-06-23 NOTE — ED Notes (Signed)
Per radiology tech pt became dizzy when standing for chest x-ray.  Pt states he felt like he was going to pass out when he stood for his x-ray.  Dr. Jeraldine Loots notified.  Advised pt not to get up.

## 2017-06-24 DIAGNOSIS — N179 Acute kidney failure, unspecified: Secondary | ICD-10-CM

## 2017-06-24 DIAGNOSIS — J441 Chronic obstructive pulmonary disease with (acute) exacerbation: Secondary | ICD-10-CM | POA: Diagnosis not present

## 2017-06-24 LAB — BASIC METABOLIC PANEL
Anion gap: 10 (ref 5–15)
BUN: 41 mg/dL — ABNORMAL HIGH (ref 6–20)
CHLORIDE: 108 mmol/L (ref 101–111)
CO2: 13 mmol/L — AB (ref 22–32)
CREATININE: 1.72 mg/dL — AB (ref 0.61–1.24)
Calcium: 7.5 mg/dL — ABNORMAL LOW (ref 8.9–10.3)
GFR calc non Af Amer: 39 mL/min — ABNORMAL LOW (ref >60)
GFR, EST AFRICAN AMERICAN: 45 mL/min — AB (ref >60)
GLUCOSE: 208 mg/dL — AB (ref 65–99)
Potassium: 3.8 mmol/L (ref 3.5–5.1)
Sodium: 131 mmol/L — ABNORMAL LOW (ref 135–145)

## 2017-06-24 LAB — CBC
HCT: 41.2 % (ref 39.0–52.0)
Hemoglobin: 14.2 g/dL (ref 13.0–17.0)
MCH: 28.5 pg (ref 26.0–34.0)
MCHC: 34.5 g/dL (ref 30.0–36.0)
MCV: 82.7 fL (ref 78.0–100.0)
PLATELETS: 143 10*3/uL — AB (ref 150–400)
RBC: 4.98 MIL/uL (ref 4.22–5.81)
RDW: 14.6 % (ref 11.5–15.5)
WBC: 3.4 10*3/uL — ABNORMAL LOW (ref 4.0–10.5)

## 2017-06-24 MED ORDER — ORAL CARE MOUTH RINSE
15.0000 mL | Freq: Two times a day (BID) | OROMUCOSAL | Status: DC
  Administered 2017-06-24 – 2017-06-25 (×3): 15 mL via OROMUCOSAL

## 2017-06-24 MED ORDER — SODIUM CHLORIDE 0.9 % IV SOLN
INTRAVENOUS | Status: DC
  Administered 2017-06-24 – 2017-06-25 (×2): via INTRAVENOUS

## 2017-06-24 NOTE — Progress Notes (Signed)
PROGRESS NOTE    Darryl George  YQI:[id number] DOB: August 30, 1950 DOA: 06/23/2017 PCP: System, Provider Not In     Brief Narrative:  67 year old man admitted from home on 2/8 due to shortness of breath and passing out.  She was found to be orthostatic and with acute renal failure and admission was requested.   Assessment & Plan:   Principal Problem:   AKI (acute kidney injury) (HCC) Active Problems:   CAD (coronary artery disease) of artery bypass graft   Chronic anticoagulation   Essential hypertension   Paroxysmal atrial fibrillation (HCC)   COPD with acute exacerbation (HCC)   Dehydration   Syncope due to orthostatic hypotension   Syncope   Acute renal failure -Suspect due to prerenal azotemia and ATN with orthostasis. -Improving, will continue IV fluids.  Syncope -Likely secondary to orthostatic hypotension. -Continue IV fluids, check orthostatic vital signs daily.  COPD with acute exacerbation -Continue azithromycin, nebs as well as IV steroids.  Essential hypertension -Given orthostasis, agree with continuing to hold blood pressure agents at this time.   DVT prophylaxis: SCDs Code Status: Full code Family Communication: Patient only Disposition Plan: Anticipate discharge home in 24 hours  Consultants:   None  Procedures:   None  Antimicrobials:  Anti-infectives (From admission, onward)   Start     Dose/Rate Route Frequency Ordered Stop   06/24/17 1000  azithromycin (ZITHROMAX) tablet 250 mg     250 mg Oral Daily 06/23/17 1917 06/19/2017 0959   06/23/17 2000  azithromycin (ZITHROMAX) tablet 500 mg     500 mg Oral Daily 06/23/17 1917 06/23/17 1944       Subjective: In bed, no complaints, shortness of breath has improved, denies chest pain, has been less dizzy with ambulation.  Anxious about discharge home  Objective: Vitals:   06/24/17 0522 06/24/17 0832 06/24/17 1424 06/24/17 1445  BP:      Pulse:      Resp:      Temp:   98.2 F (36.8 C)     TempSrc:      SpO2: 95% 91% 91% 93%  Weight:      Height:        Intake/Output Summary (Last 24 hours) at 06/24/2017 1721 Last data filed at 06/24/2017 1500 Gross per 24 hour  Intake 2348.33 ml  Output 800 ml  Net 1548.33 ml   Filed Weights   06/23/17 2100 06/24/17 0500  Weight: 81.8 kg (180 lb 5.4 oz) 81.2 kg (179 lb 0.2 oz)    Examination:  General exam: Alert, awake, oriented x 3 Respiratory system: Clear to auscultation. Respiratory effort normal. Cardiovascular system:RRR. No murmurs, rubs, gallops. Gastrointestinal system: Abdomen is nondistended, soft and nontender. No organomegaly or masses felt. Normal bowel sounds heard. Central nervous system: Alert and oriented. No focal neurological deficits. Extremities: No C/C/E, +pedal pulses Skin: No rashes, lesions or ulcers Psychiatry: Judgement and insight appear normal. Mood & affect appropriate.     Data Reviewed: I have personally reviewed following labs and imaging studies  CBC: Recent Labs  Lab 06/23/17 1322 06/24/17 0636  WBC 4.5 3.4*  NEUTROABS 3.0  --   HGB 16.3 14.2  HCT 46.5 41.2  MCV 82.0 82.7  PLT 163 143*   Basic Metabolic Panel: Recent Labs  Lab 06/23/17 1322 06/24/17 0636  NA 128* 131*  K 3.5 3.8  CL 103 108  CO2 13* 13*  GLUCOSE 109* 208*  BUN 55* 41*  CREATININE 2.70* 1.72*  CALCIUM 8.4* 7.5*  GFR: Estimated Creatinine Clearance: 46.5 mL/min (A) (by C-G formula based on SCr of 1.72 mg/dL (H)). Liver Function Tests: Recent Labs  Lab 06/23/17 1322  AST 45*  ALT 28  ALKPHOS 97  BILITOT 0.9  PROT 7.0  ALBUMIN 3.5   No results for input(s): LIPASE, AMYLASE in the last 168 hours. No results for input(s): AMMONIA in the last 168 hours. Coagulation Profile: No results for input(s): INR, PROTIME in the last 168 hours. Cardiac Enzymes: Recent Labs  Lab 06/23/17 1322 06/23/17 1807  TROPONINI 0.03* 0.03*   BNP (last 3 results) No results for input(s): PROBNP in the last 8760  hours. HbA1C: No results for input(s): HGBA1C in the last 72 hours. CBG: No results for input(s): GLUCAP in the last 168 hours. Lipid Profile: No results for input(s): CHOL, HDL, LDLCALC, TRIG, CHOLHDL, LDLDIRECT in the last 72 hours. Thyroid Function Tests: No results for input(s): TSH, T4TOTAL, FREET4, T3FREE, THYROIDAB in the last 72 hours. Anemia Panel: No results for input(s): VITAMINB12, FOLATE, FERRITIN, TIBC, IRON, RETICCTPCT in the last 72 hours. Urine analysis:    Component Value Date/Time   COLORURINE STRAW (A) 07/30/2016 1728   APPEARANCEUR CLEAR 07/30/2016 1728   LABSPEC 1.005 07/30/2016 1728   PHURINE 6.0 07/30/2016 1728   GLUCOSEU NEGATIVE 07/30/2016 1728   HGBUR SMALL (A) 07/30/2016 1728   BILIRUBINUR NEGATIVE 07/30/2016 1728   KETONESUR NEGATIVE 07/30/2016 1728   PROTEINUR NEGATIVE 07/30/2016 1728   NITRITE NEGATIVE 07/30/2016 1728   LEUKOCYTESUR NEGATIVE 07/30/2016 1728   Sepsis Labs: @LABRCNTIP (procalcitonin:4,lacticidven:4)  )No results found for this or any previous visit (from the past 240 hour(s)).       Radiology Studies: Dg Chest 2 View  Result Date: 06/23/2017 CLINICAL DATA:  Shortness of breath, weakness. EXAM: CHEST  2 VIEW COMPARISON:  Chest x-rays dated 02/29/2016 and 01/24/2016. FINDINGS: Heart size and mediastinal contours are stable. Subtle opacities within the left mid and lower lung zones, slightly more prominent than on previous exams. Right lung is clear. No pleural effusion or pneumothorax seen. No acute or suspicious osseous finding. IMPRESSION: Subtle opacities within the left mid and lower lung zones, slightly more prominent than on previous exams suggesting recurrent mild edema or developing pneumonia. Favor mild edema superimposed on chronic scarring/fibrosis if no fever. Electronically Signed   By: Bary Richard M.D.   On: 06/23/2017 14:07   Nm Pulmonary Vent And Perf (v/q Scan)  Result Date: 06/23/2017 CLINICAL DATA:  Shortness of  breath and weakness. EXAM: NUCLEAR MEDICINE VENTILATION - PERFUSION LUNG SCAN TECHNIQUE: Ventilation images were obtained in multiple projections using inhaled aerosol Tc-79m DTPA. Perfusion images were obtained in multiple projections after intravenous injection of Tc-14m MAA. RADIOPHARMACEUTICALS:  32.0 mCi of Tc-72m DTPA aerosol inhalation and 4.4 mCi Tc15m MAA-IV COMPARISON:  Chest x-ray June 23, 2017 FINDINGS: Multiple matched defects are identified, worse on ventilation and perfusion. No unmatched defects. IMPRESSION: Very low probability V/Q scan.  No evidence of pulmonary embolus. Electronically Signed   By: Gerome Sam III M.D   On: 06/23/2017 17:15        Scheduled Meds: . aspirin EC  81 mg Oral Daily  . azithromycin  250 mg Oral Daily  . ipratropium-albuterol  3 mL Nebulization TID  . mouth rinse  15 mL Mouth Rinse BID  . Melatonin  6 mg Oral QHS  . methylPREDNISolone (SOLU-MEDROL) injection  80 mg Intravenous Q12H   Continuous Infusions: . sodium chloride 100 mL/hr at 06/24/17 1446  LOS: 0 days    Time spent: 25 minutes. Greater than 50% of this time was spent in direct contact with the patient coordinating care.     Chaya Jan, MD [facility] Pager [phone]  If 7PM-7AM, please contact night-coverage [email or web address] Password [email or web address] 06/24/2017, 5:21 PM

## 2017-06-25 DIAGNOSIS — N179 Acute kidney failure, unspecified: Secondary | ICD-10-CM | POA: Diagnosis not present

## 2017-06-25 DIAGNOSIS — J441 Chronic obstructive pulmonary disease with (acute) exacerbation: Secondary | ICD-10-CM

## 2017-06-25 LAB — BASIC METABOLIC PANEL
ANION GAP: 12 (ref 5–15)
BUN: 32 mg/dL — ABNORMAL HIGH (ref 6–20)
CO2: 15 mmol/L — ABNORMAL LOW (ref 22–32)
Calcium: 7.8 mg/dL — ABNORMAL LOW (ref 8.9–10.3)
Chloride: 108 mmol/L (ref 101–111)
Creatinine, Ser: 1.44 mg/dL — ABNORMAL HIGH (ref 0.61–1.24)
GFR, EST AFRICAN AMERICAN: 56 mL/min — AB (ref >60)
GFR, EST NON AFRICAN AMERICAN: 48 mL/min — AB (ref >60)
Glucose, Bld: 145 mg/dL — ABNORMAL HIGH (ref 65–99)
POTASSIUM: 3.8 mmol/L (ref 3.5–5.1)
Sodium: 135 mmol/L (ref 135–145)

## 2017-06-25 MED ORDER — PREDNISONE 10 MG PO TABS: 10.0000 mg | ORAL_TABLET | Freq: Every day | ORAL | 0 refills | Status: AC

## 2017-06-25 MED ORDER — AZITHROMYCIN 250 MG PO TABS: ORAL_TABLET | ORAL | 0 refills | Status: AC

## 2017-06-25 MED ORDER — ALPRAZOLAM 0.5 MG PO TABS
0.5000 mg | ORAL_TABLET | Freq: Once | ORAL | Status: AC
  Administered 2017-06-25: 0.5 mg via ORAL
  Filled 2017-06-25: qty 1

## 2017-06-25 NOTE — Progress Notes (Signed)
Denies dizziness and neurological checks are within normal range.

## 2017-06-25 NOTE — Care Management Note (Signed)
Case Management Note  Patient Details  Name: [name] MRN: [id number] Date of Birth: October 18, 1950  Subjective/Objective:  67 y.o. To be discharged with home O2 and RW. Made jermaine with [facility] aware. Will need O2 sat note and MD orders                  Action/Plan:CM will sign off for now but will be available should additional discharge needs arise or disposition change.    Expected Discharge Date:  06/25/17               Expected Discharge Plan:     In-House Referral:     Discharge planning Services     Post Acute Care Choice:    Choice offered to:     DME Arranged:  Oxygen, Walker rolling DME Agency:  [facility]  HH Arranged:    [facility] Agency:     Status of Service:  Completed, signed off  If discussed at [employer] of Stay Meetings, dates discussed:    Additional Comments:  Yvone Neu, RN 06/25/2017, 3:00 PM

## 2017-06-25 NOTE — Progress Notes (Signed)
Walk test done.  Needed 4 liters at rest and 8 liters walking to keep 02 sat at 92%.  Have taliked to case worker for setting up Home 02.  Patient says he is going to leave before Home 02 is going to be arranged.  Have contacted Dr. Ardyth Harps

## 2017-06-25 NOTE — Evaluation (Signed)
Physical Therapy Evaluation Patient Details Name: Darryl George MRN: [id number] DOB: [birth year] Today's Date: 06/25/2017   History of Present Illness  Darryl George is a 67 y.o. male with medical history significant of COPD, hypertension comes in with several days of shortness of breath and wheezing with coughing.  He denies any fevers or chills.  He states at least 3 times over the last 2 or 3 days he has had syncopal episodes where twice have been when he is standing where he just passes out.  He denies any chest pain with these events.  He denies any lower extremity edema or swelling.  Patient is not eating or drinking very well in the last several days due to his coughing and respiratory issues.  Today he is found to be significantly orthostatic with acute kidney injury with a creatinine bumped up to 2.7.  Patient is being referred for admission for AK I and dehydration along with orthostatic syncope.  Clinical Impression  Darryl George states that he normally uses no assistive device.  He is I in  Bed mobility and transfers.  Gait is limited due to RESPIRATORY no PHYSICAL limitations.      Follow Up Recommendations No PT follow up    Equipment Recommendations  Rolling walker with 5" wheels       Precautions / Restrictions Precautions Precautions: Fall Restrictions Weight Bearing Restrictions: No      Mobility  Bed Mobility Overal bed mobility: Modified Independent                Transfers Overall transfer level: Modified independent Equipment used: Rolling walker (2 wheeled)                Ambulation/Gait Ambulation/Gait assistance: Modified independent (Device/Increase time) Ambulation Distance (Feet): 60 Feet Assistive device: Rolling walker (2 wheeled) Gait Pattern/deviations: Step-through pattern                 Pertinent Vitals/Pain Pain Assessment: No/denies pain    Home Living Family/patient expects to be discharged to:: Private  residence Living Arrangements: Spouse/significant other Available Help at Discharge: Family Type of Home: House Home Access: Stairs to enter Entrance Stairs-Rails: Right Entrance Stairs-Number of Steps: 3 Home Layout: Able to live on main level with bedroom/bathroom Home Equipment: None      Prior Function    I in all activities              Hand Dominance        Extremity/Trunk Assessment        Lower Extremity Assessment Lower Extremity Assessment: Overall WFL for tasks assessed       Communication    I  Cognition Arousal/Alertness: Awake/alert Behavior During Therapy: WFL for tasks assessed/performed Overall Cognitive Status: Within Functional Limits for tasks assessed                                               Assessment/Plan    PT Assessment Patient needs continued PT services  PT Problem List Decreased activity tolerance       PT Treatment Interventions Gait training;Therapeutic exercise    PT Goals (Current goals can be found in the Care Plan section)  Acute Rehab PT Goals Patient Stated Goal: To go home  PT Goal Formulation: With patient Time For Goal Achievement: 06/27/17 Potential to Achieve Goals: Good  Frequency Min 3X/week              End of Session Equipment Utilized During Treatment: Gait belt Activity Tolerance: Patient tolerated treatment well Patient left: in chair;with call bell/phone within reach   PT Visit Diagnosis: History of falling (Z91.81)    Time: 1245-1310 PT Time Calculation (min) (ACUTE ONLY): 25 min   Charges:   PT Evaluation $PT Eval Low Complexity: 1 Low       Virgina Organ, PT CLT [phone] 06/25/2017, 1:14 PM

## 2017-06-25 NOTE — Discharge Summary (Signed)
Physician Discharge Summary  Darryl George [id number]:[id number] DOB: Jan 28, 1951 DOA: 06/23/2017  PCP: System, Provider Not In  Admit date: 06/23/2017 Discharge date: 06/25/2017  Time spent: 45 minutes  Recommendations for Outpatient Follow-up:  -Will be discharged home today. -Needs follow-up with PCP in 2 weeks, for blood pressure check as all his antihypertensives have been placed on hold due to orthostasis and renal failure. -Needs oxygen for his COPD at 4-6 L, further oxygen needs can be addressed at time of outpatient follow-up.  Discharge Diagnoses:  Principal Problem:   AKI (acute kidney injury) (HCC) Active Problems:   CAD (coronary artery disease) of artery bypass graft   Chronic anticoagulation   Essential hypertension   Paroxysmal atrial fibrillation (HCC)   COPD with acute exacerbation (HCC)   Dehydration   Syncope due to orthostatic hypotension   Syncope   Discharge Condition: Stable and improved  Filed Weights   06/23/17 2100 06/24/17 0500 06/25/17 1638  Weight: 81.8 kg (180 lb 5.4 oz) 81.2 kg (179 lb 0.2 oz) 81.1 kg (178 lb 12.7 oz)    History of present illness:  As per Dr. Onalee George on 2/8: Darryl George is a 67 y.o. male with medical history significant of COPD, hypertension comes in with several days of shortness of breath and wheezing with coughing.  He denies any fevers or chills.  He states at least 3 times over the last 2 or 3 days he has had syncopal episodes where twice have been when he is standing where he just passes out.  He denies any chest pain with these events.  He denies any lower extremity edema or swelling.  Patient is not eating or drinking very well in the last several days due to his coughing and respiratory issues.  Today he is found to be significantly orthostatic with acute kidney injury with a creatinine bumped up to 2.7.  Patient is being referred for admission for AK I and dehydration along with orthostatic syncope.    Hospital Course:    Acute renal failure -Suspect due to prerenal azotemia and ATN with orthostasis. -Improving, Cr down to 1.4 on DC. -nephrotoxic agents remain on hold.  Syncope -Likely secondary to orthostatic hypotension. -Resolved with IVF.  COPD with acute exacerbation -Continue azithromycin, nebs as well as a prednisone taper. -Will be discharged on home oxygen.  Essential hypertension -Given orthostasis, agree with continuing to hold blood pressure agents at this time. -On DC, although he is no longer orthostatic he remains with low normal blood pressure in the low 100s systolic.    Procedures:  None   Consultations:  None  Discharge Instructions  Discharge Instructions    Diet - low sodium heart healthy   Complete by:  As directed    Increase activity slowly   Complete by:  As directed      Allergies as of 06/25/2017   No Known Allergies     Medication List    STOP taking these medications   amLODipine 5 MG tablet Commonly known as:  NORVASC   metoprolol succinate 100 MG 24 hr tablet Commonly known as:  TOPROL-XL   valsartan 320 MG tablet Commonly known as:  DIOVAN     TAKE these medications   albuterol 108 (90 Base) MCG/ACT inhaler Commonly known as:  PROVENTIL HFA;VENTOLIN HFA Inhale 2 puffs into the lungs every 6 (six) hours as needed for wheezing or shortness of breath.   atorvastatin 80 MG tablet Commonly known as:  LIPITOR Take  1 tablet (80 mg total) by mouth daily at 6 PM. What changed:  when to take this   azithromycin 250 MG tablet Commonly known as:  ZITHROMAX Take one tablet daily for 4 days Start taking on:  06/26/2017   Melatonin 3 MG Tabs Take 6 mg by mouth at bedtime.   predniSONE 10 MG tablet Commonly known as:  DELTASONE Take 1 tablet (10 mg total) by mouth daily with breakfast. Take 6 tablets today and then decrease by 1 tablet daily until none are left.            Durable Medical Equipment  (From admission, onward)         Start     Ordered   06/25/17 1600  For home use only DME oxygen  Once    Comments:  4-6 L  Question Answer Comment  Mode or (Route) Nasal cannula   Liters per Minute 4   Oxygen delivery system Gas      06/25/17 1600   06/25/17 1506  For home use only DME Walker rolling  Once    Question:  Patient needs a walker to treat with the following condition  Answer:  Immobility   06/25/17 1506     No Known Allergies Follow-up Information    your regular physician. Schedule an appointment as soon as possible for a visit in 1 week(s).        [facility] - Dme Follow up.   Why:  Oxygen and RW will be provided by above and delivered to your hospital room Contact information: [address] [place] [address] [phone]            The results of significant diagnostics from this hospitalization (including imaging, microbiology, ancillary and laboratory) are listed below for reference.    Significant Diagnostic Studies: Dg Chest 2 View  Result Date: 06/23/2017 CLINICAL DATA:  Shortness of breath, weakness. EXAM: CHEST  2 VIEW COMPARISON:  Chest x-rays dated 02/29/2016 and 01/24/2016. FINDINGS: Heart size and mediastinal contours are stable. Subtle opacities within the left mid and lower lung zones, slightly more prominent than on previous exams. Right lung is clear. No pleural effusion or pneumothorax seen. No acute or suspicious osseous finding. IMPRESSION: Subtle opacities within the left mid and lower lung zones, slightly more prominent than on previous exams suggesting recurrent mild edema or developing pneumonia. Favor mild edema superimposed on chronic scarring/fibrosis if no fever. Electronically Signed   By: Bary Richard M.D.   On: 06/23/2017 14:07   Nm Pulmonary Vent And Perf (v/q Scan)  Result Date: 06/23/2017 CLINICAL DATA:  Shortness of breath and weakness. EXAM: NUCLEAR MEDICINE VENTILATION - PERFUSION LUNG SCAN TECHNIQUE: Ventilation images were obtained  in multiple projections using inhaled aerosol Tc-62m DTPA. Perfusion images were obtained in multiple projections after intravenous injection of Tc-12m MAA. RADIOPHARMACEUTICALS:  32.0 mCi of Tc-22m DTPA aerosol inhalation and 4.4 mCi Tc47m MAA-IV COMPARISON:  Chest x-ray June 23, 2017 FINDINGS: Multiple matched defects are identified, worse on ventilation and perfusion. No unmatched defects. IMPRESSION: Very low probability V/Q scan.  No evidence of pulmonary embolus. Electronically Signed   By: Gerome Sam III M.D   On: 06/23/2017 17:15    Microbiology: No results found for this or any previous visit (from the past 240 hour(s)).   Labs: Basic Metabolic Panel: Recent Labs  Lab 06/23/17 1322 06/24/17 0636 06/25/17 0630  NA 128* 131* 135  K 3.5 3.8 3.8  CL 103 108  108  CO2 13* 13* 15*  GLUCOSE 109* 208* 145*  BUN 55* 41* 32*  CREATININE 2.70* 1.72* 1.44*  CALCIUM 8.4* 7.5* 7.8*   Liver Function Tests: Recent Labs  Lab 06/23/17 1322  AST 45*  ALT 28  ALKPHOS 97  BILITOT 0.9  PROT 7.0  ALBUMIN 3.5   No results for input(s): LIPASE, AMYLASE in the last 168 hours. No results for input(s): AMMONIA in the last 168 hours. CBC: Recent Labs  Lab 06/23/17 1322 06/24/17 0636  WBC 4.5 3.4*  NEUTROABS 3.0  --   HGB 16.3 14.2  HCT 46.5 41.2  MCV 82.0 82.7  PLT 163 143*   Cardiac Enzymes: Recent Labs  Lab 06/23/17 1322 06/23/17 1807  TROPONINI 0.03* 0.03*   BNP: BNP (last 3 results) Recent Labs    06/23/17 1322  BNP 55.0    ProBNP (last 3 results) No results for input(s): PROBNP in the last 8760 hours.  CBG: No results for input(s): GLUCAP in the last 168 hours.     Signed:  Chaya Jan  [facility] Pager: [phone] 06/25/2017, 4:00 PM

## 2017-06-25 NOTE — Progress Notes (Signed)
SATURATION QUALIFICATIONS: (This note is used to comply with regulatory documentation for home oxygen)  Patient Saturations on Room Air at Rest = 83%  Patient Saturations on Room Air while Ambulating = 79%  Patient Saturations on 8Liters of oxygen while Ambulating = 92%  Please briefly explain why patient needs home oxygen: DESATS ON ROOM AIR

## 2017-06-28 ENCOUNTER — Emergency Department (HOSPITAL_COMMUNITY): Payer: Medicare HMO

## 2017-06-28 ENCOUNTER — Other Ambulatory Visit: Payer: Self-pay

## 2017-06-28 ENCOUNTER — Encounter (HOSPITAL_COMMUNITY): Payer: Self-pay

## 2017-06-28 ENCOUNTER — Inpatient Hospital Stay (HOSPITAL_COMMUNITY)
Admission: EM | Admit: 2017-06-28 | Discharge: 2017-07-14 | DRG: 853 | Disposition: E | Payer: Medicare HMO | Attending: Pulmonary Disease | Admitting: Pulmonary Disease

## 2017-06-28 DIAGNOSIS — Z803 Family history of malignant neoplasm of breast: Secondary | ICD-10-CM

## 2017-06-28 DIAGNOSIS — I10 Essential (primary) hypertension: Secondary | ICD-10-CM | POA: Diagnosis present

## 2017-06-28 DIAGNOSIS — I361 Nonrheumatic tricuspid (valve) insufficiency: Secondary | ICD-10-CM | POA: Diagnosis not present

## 2017-06-28 DIAGNOSIS — Z841 Family history of disorders of kidney and ureter: Secondary | ICD-10-CM

## 2017-06-28 DIAGNOSIS — R6521 Severe sepsis with septic shock: Secondary | ICD-10-CM | POA: Diagnosis present

## 2017-06-28 DIAGNOSIS — Z8249 Family history of ischemic heart disease and other diseases of the circulatory system: Secondary | ICD-10-CM | POA: Diagnosis not present

## 2017-06-28 DIAGNOSIS — Z87891 Personal history of nicotine dependence: Secondary | ICD-10-CM

## 2017-06-28 DIAGNOSIS — J440 Chronic obstructive pulmonary disease with acute lower respiratory infection: Secondary | ICD-10-CM | POA: Diagnosis present

## 2017-06-28 DIAGNOSIS — R739 Hyperglycemia, unspecified: Secondary | ICD-10-CM | POA: Diagnosis not present

## 2017-06-28 DIAGNOSIS — T462X5A Adverse effect of other antidysrhythmic drugs, initial encounter: Secondary | ICD-10-CM | POA: Diagnosis not present

## 2017-06-28 DIAGNOSIS — J189 Pneumonia, unspecified organism: Secondary | ICD-10-CM | POA: Diagnosis present

## 2017-06-28 DIAGNOSIS — Z66 Do not resuscitate: Secondary | ICD-10-CM | POA: Diagnosis not present

## 2017-06-28 DIAGNOSIS — Y95 Nosocomial condition: Secondary | ICD-10-CM | POA: Diagnosis present

## 2017-06-28 DIAGNOSIS — N179 Acute kidney failure, unspecified: Secondary | ICD-10-CM | POA: Diagnosis not present

## 2017-06-28 DIAGNOSIS — L98429 Non-pressure chronic ulcer of back with unspecified severity: Secondary | ICD-10-CM | POA: Diagnosis not present

## 2017-06-28 DIAGNOSIS — E871 Hypo-osmolality and hyponatremia: Secondary | ICD-10-CM | POA: Diagnosis present

## 2017-06-28 DIAGNOSIS — Z9289 Personal history of other medical treatment: Secondary | ICD-10-CM

## 2017-06-28 DIAGNOSIS — E870 Hyperosmolality and hypernatremia: Secondary | ICD-10-CM | POA: Diagnosis not present

## 2017-06-28 DIAGNOSIS — Z515 Encounter for palliative care: Secondary | ICD-10-CM

## 2017-06-28 DIAGNOSIS — Z8673 Personal history of transient ischemic attack (TIA), and cerebral infarction without residual deficits: Secondary | ICD-10-CM

## 2017-06-28 DIAGNOSIS — J80 Acute respiratory distress syndrome: Secondary | ICD-10-CM | POA: Diagnosis present

## 2017-06-28 DIAGNOSIS — Z7189 Other specified counseling: Secondary | ICD-10-CM

## 2017-06-28 DIAGNOSIS — Z951 Presence of aortocoronary bypass graft: Secondary | ICD-10-CM

## 2017-06-28 DIAGNOSIS — J449 Chronic obstructive pulmonary disease, unspecified: Secondary | ICD-10-CM | POA: Diagnosis not present

## 2017-06-28 DIAGNOSIS — R0600 Dyspnea, unspecified: Secondary | ICD-10-CM | POA: Diagnosis not present

## 2017-06-28 DIAGNOSIS — I469 Cardiac arrest, cause unspecified: Secondary | ICD-10-CM

## 2017-06-28 DIAGNOSIS — Z452 Encounter for adjustment and management of vascular access device: Secondary | ICD-10-CM

## 2017-06-28 DIAGNOSIS — I9789 Other postprocedural complications and disorders of the circulatory system, not elsewhere classified: Secondary | ICD-10-CM

## 2017-06-28 DIAGNOSIS — Z9981 Dependence on supplemental oxygen: Secondary | ICD-10-CM | POA: Diagnosis not present

## 2017-06-28 DIAGNOSIS — E876 Hypokalemia: Secondary | ICD-10-CM | POA: Diagnosis present

## 2017-06-28 DIAGNOSIS — A419 Sepsis, unspecified organism: Secondary | ICD-10-CM | POA: Diagnosis present

## 2017-06-28 DIAGNOSIS — I2510 Atherosclerotic heart disease of native coronary artery without angina pectoris: Secondary | ICD-10-CM | POA: Diagnosis present

## 2017-06-28 DIAGNOSIS — J441 Chronic obstructive pulmonary disease with (acute) exacerbation: Secondary | ICD-10-CM

## 2017-06-28 DIAGNOSIS — G9341 Metabolic encephalopathy: Secondary | ICD-10-CM | POA: Diagnosis present

## 2017-06-28 DIAGNOSIS — L8990 Pressure ulcer of unspecified site, unspecified stage: Secondary | ICD-10-CM

## 2017-06-28 DIAGNOSIS — J9601 Acute respiratory failure with hypoxia: Secondary | ICD-10-CM

## 2017-06-28 DIAGNOSIS — J9600 Acute respiratory failure, unspecified whether with hypoxia or hypercapnia: Secondary | ICD-10-CM

## 2017-06-28 DIAGNOSIS — J9690 Respiratory failure, unspecified, unspecified whether with hypoxia or hypercapnia: Secondary | ICD-10-CM

## 2017-06-28 DIAGNOSIS — E440 Moderate protein-calorie malnutrition: Secondary | ICD-10-CM

## 2017-06-28 DIAGNOSIS — I4891 Unspecified atrial fibrillation: Secondary | ICD-10-CM

## 2017-06-28 DIAGNOSIS — I252 Old myocardial infarction: Secondary | ICD-10-CM | POA: Diagnosis not present

## 2017-06-28 DIAGNOSIS — Z4659 Encounter for fitting and adjustment of other gastrointestinal appliance and device: Secondary | ICD-10-CM

## 2017-06-28 DIAGNOSIS — J9621 Acute and chronic respiratory failure with hypoxia: Secondary | ICD-10-CM

## 2017-06-28 DIAGNOSIS — R001 Bradycardia, unspecified: Secondary | ICD-10-CM | POA: Diagnosis not present

## 2017-06-28 LAB — I-STAT CHEM 8, ED
BUN: 20 mg/dL (ref 6–20)
CHLORIDE: 105 mmol/L (ref 101–111)
Calcium, Ion: 1.06 mmol/L — ABNORMAL LOW (ref 1.15–1.40)
Creatinine, Ser: 1.2 mg/dL (ref 0.61–1.24)
Glucose, Bld: 129 mg/dL — ABNORMAL HIGH (ref 65–99)
HEMATOCRIT: 37 % — AB (ref 39.0–52.0)
Hemoglobin: 12.6 g/dL — ABNORMAL LOW (ref 13.0–17.0)
POTASSIUM: 3.4 mmol/L — AB (ref 3.5–5.1)
SODIUM: 137 mmol/L (ref 135–145)
TCO2: 19 mmol/L — AB (ref 22–32)

## 2017-06-28 LAB — COMPREHENSIVE METABOLIC PANEL
ALK PHOS: 149 U/L — AB (ref 38–126)
ALT: 44 U/L (ref 17–63)
AST: 55 U/L — ABNORMAL HIGH (ref 15–41)
Albumin: 2.7 g/dL — ABNORMAL LOW (ref 3.5–5.0)
Anion gap: 15 (ref 5–15)
BUN: 20 mg/dL (ref 6–20)
CALCIUM: 7.7 mg/dL — AB (ref 8.9–10.3)
CO2: 16 mmol/L — AB (ref 22–32)
CREATININE: 1.35 mg/dL — AB (ref 0.61–1.24)
Chloride: 99 mmol/L — ABNORMAL LOW (ref 101–111)
GFR calc non Af Amer: 52 mL/min — ABNORMAL LOW (ref >60)
GLUCOSE: 134 mg/dL — AB (ref 65–99)
Potassium: 3.2 mmol/L — ABNORMAL LOW (ref 3.5–5.1)
SODIUM: 130 mmol/L — AB (ref 135–145)
Total Bilirubin: 1.4 mg/dL — ABNORMAL HIGH (ref 0.3–1.2)
Total Protein: 6.3 g/dL — ABNORMAL LOW (ref 6.5–8.1)

## 2017-06-28 LAB — BLOOD GAS, ARTERIAL
ACID-BASE EXCESS: 4.5 mmol/L — AB (ref 0.0–2.0)
Acid-base deficit: 7.2 mmol/L — ABNORMAL HIGH (ref 0.0–2.0)
BICARBONATE: 28.1 mmol/L — AB (ref 20.0–28.0)
Bicarbonate: 17.8 mmol/L — ABNORMAL LOW (ref 20.0–28.0)
Drawn by: 21310
FIO2: 100
FIO2: 100
LHR: 30 {breaths}/min
MECHVT: 640 mL
O2 SAT: 83.8 %
O2 Saturation: 83.7 %
PATIENT TEMPERATURE: 37
PCO2 ART: 47.3 mmHg (ref 32.0–48.0)
PEEP/CPAP: 16 cmH2O
PEEP: 8 cmH2O
PH ART: 7.23 — AB (ref 7.350–7.450)
PO2 ART: 63.1 mmHg — AB (ref 83.0–108.0)
Patient temperature: 36.6
RATE: 18 resp/min
VT: 400 mL
pCO2 arterial: 40.7 mmHg (ref 32.0–48.0)
pH, Arterial: 7.456 — ABNORMAL HIGH (ref 7.350–7.450)
pO2, Arterial: 48.9 mmHg — ABNORMAL LOW (ref 83.0–108.0)

## 2017-06-28 LAB — CBC WITH DIFFERENTIAL/PLATELET
Basophils Absolute: 0.1 10*3/uL (ref 0.0–0.1)
Basophils Relative: 0 %
EOS ABS: 0 10*3/uL (ref 0.0–0.7)
Eosinophils Relative: 0 %
HCT: 38.1 % — ABNORMAL LOW (ref 39.0–52.0)
HEMOGLOBIN: 12.9 g/dL — AB (ref 13.0–17.0)
LYMPHS ABS: 2.1 10*3/uL (ref 0.7–4.0)
LYMPHS PCT: 12 %
MCH: 28 pg (ref 26.0–34.0)
MCHC: 33.9 g/dL (ref 30.0–36.0)
MCV: 82.8 fL (ref 78.0–100.0)
Monocytes Absolute: 1 10*3/uL (ref 0.1–1.0)
Monocytes Relative: 6 %
NEUTROS PCT: 82 %
Neutro Abs: 14.1 10*3/uL (ref 1.7–7.7)
Platelets: 153 10*3/uL (ref 150–400)
RBC: 4.6 MIL/uL (ref 4.22–5.81)
RDW: 15.1 % (ref 11.5–15.5)
WBC: 17.3 10*3/uL — AB (ref 4.0–10.5)

## 2017-06-28 LAB — URINALYSIS, ROUTINE W REFLEX MICROSCOPIC
BACTERIA UA: NONE SEEN
Bilirubin Urine: NEGATIVE
GLUCOSE, UA: NEGATIVE mg/dL
Ketones, ur: NEGATIVE mg/dL
Leukocytes, UA: NEGATIVE
NITRITE: NEGATIVE
Protein, ur: 100 mg/dL — AB
SPECIFIC GRAVITY, URINE: 1.02 (ref 1.005–1.030)
pH: 5 (ref 5.0–8.0)

## 2017-06-28 LAB — BRAIN NATRIURETIC PEPTIDE: B Natriuretic Peptide: 267 pg/mL — ABNORMAL HIGH (ref 0.0–100.0)

## 2017-06-28 LAB — I-STAT TROPONIN, ED: Troponin i, poc: 0.01 ng/mL (ref 0.00–0.08)

## 2017-06-28 LAB — I-STAT CG4 LACTIC ACID, ED: Lactic Acid, Venous: 0.97 mmol/L (ref 0.5–1.9)

## 2017-06-28 LAB — TROPONIN I: Troponin I: 0.03 ng/mL (ref <0.03)

## 2017-06-28 LAB — TSH: TSH: 0.145 u[IU]/mL — AB (ref 0.350–4.500)

## 2017-06-28 MED ORDER — MIDAZOLAM HCL 2 MG/2ML IJ SOLN
2.0000 mg | Freq: Once | INTRAMUSCULAR | Status: AC
Start: 2017-06-28 — End: 2017-06-28
  Administered 2017-06-28: 2 mg via INTRAVENOUS

## 2017-06-28 MED ORDER — SODIUM BICARBONATE 8.4 % IV SOLN
200.0000 meq | Freq: Once | INTRAVENOUS | Status: AC
  Administered 2017-06-28: 200 meq via INTRAVENOUS
  Filled 2017-06-28: qty 50

## 2017-06-28 MED ORDER — SODIUM CHLORIDE 0.9 % IV BOLUS (SEPSIS)
1000.0000 mL | Freq: Once | INTRAVENOUS | Status: AC
  Administered 2017-06-28: 1000 mL via INTRAVENOUS

## 2017-06-28 MED ORDER — PROPOFOL 1000 MG/100ML IV EMUL
INTRAVENOUS | Status: AC
  Administered 2017-06-28: 45 ug/kg/min via INTRAVENOUS
  Filled 2017-06-28: qty 100

## 2017-06-28 MED ORDER — ROCURONIUM BROMIDE 50 MG/5ML IV SOLN
50.0000 mg | Freq: Once | INTRAVENOUS | Status: AC
  Administered 2017-06-28: 50 mg via INTRAVENOUS

## 2017-06-28 MED ORDER — AMIODARONE HCL IN DEXTROSE 360-4.14 MG/200ML-% IV SOLN
30.0000 mg/h | INTRAVENOUS | Status: DC
  Administered 2017-06-29 (×2): 30 mg/h via INTRAVENOUS
  Filled 2017-06-28 (×2): qty 200

## 2017-06-28 MED ORDER — ETOMIDATE 2 MG/ML IV SOLN
20.0000 mg | Freq: Once | INTRAVENOUS | Status: AC
  Administered 2017-06-28: 20 mg via INTRAVENOUS

## 2017-06-28 MED ORDER — VANCOMYCIN HCL IN DEXTROSE 1-5 GM/200ML-% IV SOLN
1000.0000 mg | Freq: Once | INTRAVENOUS | Status: AC
  Administered 2017-06-28: 1000 mg via INTRAVENOUS
  Filled 2017-06-28: qty 200

## 2017-06-28 MED ORDER — AMIODARONE LOAD VIA INFUSION
150.0000 mg | Freq: Once | INTRAVENOUS | Status: AC
  Administered 2017-06-28: 150 mg via INTRAVENOUS
  Filled 2017-06-28: qty 83.34

## 2017-06-28 MED ORDER — SODIUM CHLORIDE 0.9 % IV SOLN
2.0000 mg/h | INTRAVENOUS | Status: DC
Start: 2017-06-28 — End: 2017-06-29
  Administered 2017-06-28: 2 mg/h via INTRAVENOUS
  Filled 2017-06-28: qty 10

## 2017-06-28 MED ORDER — FENTANYL CITRATE (PF) 2500 MCG/50ML IJ SOLN
INTRAMUSCULAR | Status: AC
  Filled 2017-06-28: qty 50

## 2017-06-28 MED ORDER — PHENYLEPHRINE HCL 10 MG/ML IJ SOLN
0.0000 ug/min | INTRAMUSCULAR | Status: DC
  Administered 2017-06-28: 50 ug/min via INTRAVENOUS
  Filled 2017-06-28: qty 1

## 2017-06-28 MED ORDER — MIDAZOLAM HCL 2 MG/2ML IJ SOLN
2.0000 mg | Freq: Once | INTRAMUSCULAR | Status: AC
  Administered 2017-06-28: 2 mg via INTRAVENOUS
  Filled 2017-06-28: qty 2

## 2017-06-28 MED ORDER — AMIODARONE HCL IN DEXTROSE 360-4.14 MG/200ML-% IV SOLN
60.0000 mg/h | INTRAVENOUS | Status: AC
  Administered 2017-06-28 – 2017-06-29 (×2): 60 mg/h via INTRAVENOUS
  Filled 2017-06-28 (×2): qty 200

## 2017-06-28 MED ORDER — PIPERACILLIN-TAZOBACTAM 3.375 G IVPB 30 MIN
3.3750 g | Freq: Once | INTRAVENOUS | Status: AC
  Administered 2017-06-28: 3.375 g via INTRAVENOUS
  Filled 2017-06-28: qty 50

## 2017-06-28 MED ORDER — PHENYLEPHRINE HCL-NACL 10-0.9 MG/250ML-% IV SOLN
INTRAVENOUS | Status: AC
  Filled 2017-06-28: qty 250

## 2017-06-28 MED ORDER — MIDAZOLAM 50MG/50ML (1MG/ML) PREMIX INFUSION
INTRAVENOUS | Status: AC
  Filled 2017-06-28: qty 50

## 2017-06-28 MED ORDER — SODIUM CHLORIDE 0.9 % IV SOLN
20.0000 ug/h | INTRAVENOUS | Status: DC
  Administered 2017-06-28: 20 ug/h via INTRAVENOUS
  Filled 2017-06-28: qty 50

## 2017-06-28 MED ORDER — MIDAZOLAM HCL 2 MG/2ML IJ SOLN
INTRAMUSCULAR | Status: AC
  Filled 2017-06-28: qty 2

## 2017-06-28 MED ORDER — NOREPINEPHRINE BITARTRATE 1 MG/ML IV SOLN
2.0000 ug/min | INTRAVENOUS | Status: DC
  Administered 2017-06-28: 2 ug/min via INTRAVENOUS
  Filled 2017-06-28: qty 4

## 2017-06-28 MED ORDER — NOREPINEPHRINE 4 MG/250ML-% IV SOLN
INTRAVENOUS | Status: AC
  Filled 2017-06-28: qty 250

## 2017-06-28 MED ORDER — SODIUM CHLORIDE 0.9 % IV BOLUS (SEPSIS)
500.0000 mL | Freq: Once | INTRAVENOUS | Status: AC
  Administered 2017-06-28: 500 mL via INTRAVENOUS

## 2017-06-28 MED ORDER — PROPOFOL 1000 MG/100ML IV EMUL
30.0000 ug/kg/min | Freq: Once | INTRAVENOUS | Status: AC
  Administered 2017-06-28: 45 ug/kg/min via INTRAVENOUS

## 2017-06-28 MED ORDER — SUCCINYLCHOLINE CHLORIDE 20 MG/ML IJ SOLN
100.0000 mg | Freq: Once | INTRAMUSCULAR | Status: AC
  Administered 2017-06-28: 100 mg via INTRAVENOUS

## 2017-06-28 MED ORDER — SODIUM BICARBONATE 8.4 % IV SOLN
INTRAVENOUS | Status: AC
  Filled 2017-06-28: qty 150

## 2017-06-28 MED ORDER — METHYLPREDNISOLONE SODIUM SUCC 125 MG IJ SOLR
80.0000 mg | Freq: Once | INTRAMUSCULAR | Status: AC
  Administered 2017-06-28: 80 mg via INTRAVENOUS
  Filled 2017-06-28: qty 2

## 2017-06-28 NOTE — ED Notes (Signed)
Dr. Hyacinth Meeker at bedside.  50% sat on non-rebreather

## 2017-06-28 NOTE — ED Notes (Signed)
Rocuronium wasted with Nathanial Rancher into sink.

## 2017-06-28 NOTE — ED Notes (Signed)
Tube size 7.5, 23 at the lip.

## 2017-06-28 NOTE — ED Notes (Signed)
Temp foley placed at 1910

## 2017-06-28 NOTE — ED Notes (Signed)
Ver order completed to give Bolus, per Dr. Hyacinth Meeker.

## 2017-06-28 NOTE — ED Triage Notes (Signed)
Patient was admitted recently and d/c Sunday. Patient came into triage respiratory distress. Patient started oxygen  durning last hospital stay.   58% on oxygen in triage.

## 2017-06-28 NOTE — ED Notes (Signed)
Patient intubated by Dr. Hyacinth Meeker.

## 2017-06-28 NOTE — ED Notes (Signed)
Verbal order per Dr. Hyacinth Meeker: Give Diprovan 50mg  IV Push for sedation. Orders completed.

## 2017-06-28 NOTE — Consult Note (Signed)
Medical Consultation   DAVONNE DEHNER  VOJ:[id number]  DOB: 1950-07-11  DOA: 07/13/17  PCP: System, Provider Not In   Outpatient Specialists: None   Requesting physician: Dr Hyacinth Meeker  Reason for consultation: Medical management until the transfer   History of Present Illness: LENOXX CASSADA is an 67 y.o. male history of COPD, essential hypertension came to the hospital for shortness of breath.  Patient was discharged from here 3 days ago after being treated for acute kidney injury and COPD/acute bronchitis.  During his stay he received IV fluids he was discharged on steroids and azithromycin. Patient is currently sedated and intubated therefore history prior patient's girlfriend and daughter who are at bedside.  According to them after going home patient continued to feel short of breath but refused to come back to the ER for further evaluation.  Eventually today his breathing became very labored and when his oxygen levels checked at home he was 60% on 4 L nasal cannula therefore family member insisted for him to come to the ER. In the ER patient appeared in significant respiratory distress and hypoxic even on high flow nasal cannula therefore was intubated.  Initially he was in atrial fibrillation with RVR and elevated blood pressure.  After receiving sedatives for his intubation his blood pressure did drop but still remained tachycardic.  His labs showed sodium of 130 which is close to his baseline, creatinine 1.32, potassium 3.2, troponin 0.3, WBC 7.3.  Lactate was pending.  Chest x-ray showed severe/diffuse infiltrate concerning for ARDS versus multifocal pneumonia.  He was started on broad-spectrum antibiotics vancomycin and Zosyn.  He was also placed on sedation including Versed, fentanyl and propofol.  He was also on Levophed.  Central line was placed by the ER provider and case was discussed with Dr Deterding from Critical Care.   During my evaluation patient was still  hypoxic at 80% on 100% FiO2 with PEEP of 8.  His PEEP was increased to 12 and still remained hypoxic at 81%.  I spoke with Dr. Darrick Penna again who advised starting the patient on Amiodarone and changing his pressors from Levophed to phenylephrine.  Due to patient's severity of condition it was deemed more emergent for him to be transferred at [facility] where critical care physician is available in house.       Review of Systems:  ROS As per HPI otherwise 10 point review of systems negative.     Past Medical History: Past Medical History:  Diagnosis Date  . Back pain   . COPD (chronic obstructive pulmonary disease) (HCC)   . Hypertension   . Stroke [facility])    Noted on MRI scan performed at [facility]  . Tobacco abuse     Past Surgical History: Past Surgical History:  Procedure Laterality Date  . CARDIAC CATHETERIZATION N/A 01/23/2016   Procedure: Left Heart Cath and Coronary Angiography;  Surgeon: Lyn Records, MD;  Location: [facility] INVASIVE CV LAB;  Service: Cardiovascular;  Laterality: N/A;  . CORONARY ARTERY BYPASS GRAFT N/A 01/25/2016   Procedure: CORONARY ARTERY BYPASS GRAFTING (CABG) times five using left internal mammary artery and right saphenous vein.;  Surgeon: Kerin Perna, MD;  Location: [facility] OR;  Service: Open Heart Surgery;  Laterality: N/A;  . EYE SURGERY    . HERNIA REPAIR    . TEE WITHOUT CARDIOVERSION  01/25/2016   Procedure: TRANSESOPHAGEAL ECHOCARDIOGRAM (TEE);  Surgeon: Theron Arista  Donata Clay, MD;  Location: [facility] OR;  Service: Open Heart Surgery;;     Allergies:  No Known Allergies   Social History:  reports that he has quit smoking. His smoking use included cigarettes. He smoked 1.00 pack per day. he has never used smokeless tobacco. He reports that he does not drink alcohol or use drugs.   Family History: Family History  Problem Relation Age of Onset  . Cancer Mother   . Heart failure Mother   . Kidney disease Mother   . Aneurysm Father   .  Hypertension Father   . Cancer Other   . Breast cancer Sister      Acceptable: Family history reviewed   Physical Exam: Vitals:   July 26, 2017 2030 July 26, 2017 2040 07-26-2017 2048 07/26/17 2050  BP: 115/61 108/71  (!) 97/58  Pulse: (!) 135 (!) 134  (!) 126  Resp: (!) 26 (!) 29  (!) 28  Temp: 99.3 F (37.4 C) 99 F (37.2 C)  98.8 F (37.1 C)  SpO2: (!) 81% (!) 81% (!) 81% (!) 84%  Weight:      Height:        Constitutional: Currently sedated, intubated Eyes: PERLA,  irises appear normal, anicteric sclera,  ENMT: ET tube is in place Neck: neck appears normal, no masses, normal ROM, no thyromegaly, no JVD  CVS: Tachycardia with irregularly irregular rhythm S1-S2 clear, no murmur rubs or gallops, no LE edema, normal pedal pulses  Respiratory: Diffuse diminished breath sounds Abdomen: soft n nondistended, normal bowel sounds, no hepatosplenomegaly, no hernias  Musculoskeletal: : no cyanosis, clubbing or edema noted bilaterally Neuro: Unable to assess Psych: We will to assess Skin: no rashes or lesions or ulcers, no induration or nodules    Data reviewed:  I have personally reviewed following labs and imaging studies Labs:  CBC: Recent Labs  Lab 06/23/17 1322 06/24/17 0636 07-26-2017 1912 07/26/2017 1915  WBC 4.5 3.4*  --  17.3*  NEUTROABS 3.0  --   --  14.1  HGB 16.3 14.2 12.6* 12.9*  HCT 46.5 41.2 37.0* 38.1*  MCV 82.0 82.7  --  82.8  PLT 163 143*  --  153    Basic Metabolic Panel: Recent Labs  Lab 06/23/17 1322 06/24/17 0636 06/25/17 0630 07/26/2017 1912 2017-07-26 1915  NA 128* 131* 135 137 130*  K 3.5 3.8 3.8 3.4* 3.2*  CL 103 108 108 105 99*  CO2 13* 13* 15*  --  16*  GLUCOSE 109* 208* 145* 129* 134*  BUN 55* 41* 32* 20 20  CREATININE 2.70* 1.72* 1.44* 1.20 1.35*  CALCIUM 8.4* 7.5* 7.8*  --  7.7*   GFR Estimated Creatinine Clearance: 59.2 mL/min (A) (by C-G formula based on SCr of 1.35 mg/dL (H)). Liver Function Tests: Recent Labs  Lab 06/23/17 1322  07-26-2017 1915  AST 45* 55*  ALT 28 44  ALKPHOS 97 149*  BILITOT 0.9 1.4*  PROT 7.0 6.3*  ALBUMIN 3.5 2.7*   No results for input(s): LIPASE, AMYLASE in the last 168 hours. No results for input(s): AMMONIA in the last 168 hours. Coagulation profile No results for input(s): INR, PROTIME in the last 168 hours.  Cardiac Enzymes: Recent Labs  Lab 06/23/17 1322 06/23/17 1807 July 26, 2017 1915  TROPONINI 0.03* 0.03* 0.03*   BNP: Invalid input(s): POCBNP CBG: No results for input(s): GLUCAP in the last 168 hours. D-Dimer No results for input(s): DDIMER in the last 72 hours. Hgb A1c No results for input(s): HGBA1C in the last  72 hours. Lipid Profile No results for input(s): CHOL, HDL, LDLCALC, TRIG, CHOLHDL, LDLDIRECT in the last 72 hours. Thyroid function studies No results for input(s): TSH, T4TOTAL, T3FREE, THYROIDAB in the last 72 hours.  Invalid input(s): FREET3 Anemia work up No results for input(s): VITAMINB12, FOLATE, FERRITIN, TIBC, IRON, RETICCTPCT in the last 72 hours. Urinalysis    Component Value Date/Time   COLORURINE YELLOW 06/19/2017 1946   APPEARANCEUR HAZY (A) 06/24/2017 1946   LABSPEC 1.020 07/06/2017 1946   PHURINE 5.0 07/06/2017 1946   GLUCOSEU NEGATIVE 06/25/2017 1946   HGBUR SMALL (A) 06/25/2017 1946   BILIRUBINUR NEGATIVE 06/23/2017 1946   KETONESUR NEGATIVE 07/13/2017 1946   PROTEINUR 100 (A) 06/24/2017 1946   NITRITE NEGATIVE 06/17/2017 1946   LEUKOCYTESUR NEGATIVE 06/16/2017 1946     Microbiology No results found for this or any previous visit (from the past 240 hour(s)).     Inpatient Medications:   Scheduled Meds: . amiodarone  150 mg Intravenous Once  . methylPREDNISolone (SOLU-MEDROL) injection  80 mg Intravenous Once   Continuous Infusions: . amiodarone     Followed by  . [START ON 06/29/2017] amiodarone    . fentaNYL infusion INTRAVENOUS 20 mcg/hr (07/06/2017 2037)  . midazolam (VERSED) infusion 2 mg/hr (07/08/2017 2052)  .  phenylephrine (NEO-SYNEPHRINE) Adult infusion       Radiological Exams on Admission: Dg Chest Portable 1 View  Result Date: 06/23/2017 CLINICAL DATA:  OG tube placement.  COPD EXAM: PORTABLE CHEST 1 VIEW COMPARISON:  06/17/2017 FINDINGS: Endotracheal tube unchanged. Introduction of nasogastric tube which extends the stomach. Dense airspace disease on the LEFT. Diffuse airspace disease on the RIGHT. IMPRESSION: 1. Introduction of NG tube which extends into the stomach. 2. Endotracheal tube in good position. 3. Dense bilateral airspace disease. Electronically Signed   By: Genevive Bi M.D.   On: 07/13/2017 19:28   Dg Chest Portable 1 View  Result Date: 07/06/2017 CLINICAL DATA:  67 year old male who presented and respiratory distress. Intubated. EXAM: PORTABLE CHEST 1 VIEW COMPARISON:  06/23/2017 and earlier. FINDINGS: Portable AP supine view at 1850 hrs. Endotracheal tube tip in good position between the level the clavicles and carina. If an enteric tube is in place, it does not extend below the T1 thoracic inlet level. Stable lung volumes compared to 06/23/2017 but new diffuse bilateral pulmonary interstitial opacity. Probable left lower lobe and perihilar consolidation. Additional patchy/confluent opacity in the inferior aspect of the right upper lobe, and the right lower lobe. No pleural effusion is evident. Grossly stable mediastinal contours. Prior CABG. No pneumothorax. IMPRESSION: 1. Endotracheal tube tip in good position. If an enteric tube is in place it does not extend below the T1/thoracic inlet level. 2. Multi lobar consolidation in the left lung with superimposed acute bilateral pulmonary interstitial opacity, and patchy opacity in the right lung. Favor acute multifocal pneumonia. Consider superimposed pulmonary edema. No pleural effusion is evident. Electronically Signed   By: Odessa Fleming M.D.   On: 06/29/2017 19:14    Impression/Recommendations Active Problems:   * No active hospital  problems. *  Acute respiratory failure with significant hypoxia, 80% on FiO2 Severe ARDS Multifocal pneumonia-HCAP Acute COPD exacerbation - Patient is to be admitted to the intensive care unit at [facility] - Currently he is intubated-on sedation Versed, propofol and fentanyl.  Maintain RAAS of -2.  Will wean him off sedation as deemed necessary -Change Levophed to phenylephrine.  Maintain map above 65 - Sepsis protocol, broad-spectrum antibiotics vancomycin and Zosyn  given in the ER -Foley in place, monitor urine output -Slightly elevated BNP at 267, previous admission was 55.  If blood pressure maintains we will consider giving him a dose of Lasix -Lactate is pending -Give a dose of Solu-Medrol IV once for now -RT to perform recruiting alveoli recruiting maneuver.  Increase PEEP from 12 to 16.  - Continue to provide supportive care -Has central line in place-routine care to be performed  Atrial fibrillation with RVR - Patient is to be started on amiodarone drip followed by bolus - check TSH and get repeat echocardiogram when stable.  Last echocardiogram in March 2018 showed ejection fraction 55-60% with grade 2 diastolic dysfunction -If no better, consider cardiology consult  Essential hypertension -Continue to hold home medications  Tobacco use - Nicotine patch if needed when he wakes up.  Case discussed by me with Dr Darrick Penna.  [facility] is to transfer the patient soon given the severity of his condition.  Critical Condition, Prognosis - Guarded.  Family at bedside- Discussed care with them.    Thank you for this consultation.  Our [facility] hospitalist team will follow the patient with you, while patient is here at [facility].    Critical Care Time Spent: 50 mins   Haydn Hutsell Joline Maxcy M.D. [facility] 06/19/2017, 9:07 PM

## 2017-06-28 NOTE — ED Notes (Signed)
Dr. Hyacinth Meeker at bedside for central Line Placement.

## 2017-06-28 NOTE — Progress Notes (Signed)
Amiodarone Drug - Drug Interaction Consult Note  Recommendations:  Amiodarone is metabolized by the cytochrome P450 system and therefore has the potential to cause many drug interactions. Amiodarone has an average plasma half-life of 50 days (range 20 to 100 days).   There is potential for drug interactions to occur several weeks or months after stopping treatment and the onset of drug interactions may be slow after initiating amiodarone.   []  Statins: Increased risk of myopathy. Simvastatin- restrict dose to 20mg  daily. Other statins: counsel patients to report any muscle pain or weakness immediately.  []  Anticoagulants: Amiodarone can increase anticoagulant effect. Consider warfarin dose reduction. Patients should be monitored closely and the dose of anticoagulant altered accordingly, remembering that amiodarone levels take several weeks to stabilize.  []  Antiepileptics: Amiodarone can increase plasma concentration of phenytoin, the dose should be reduced. Note that small changes in phenytoin dose can result in large changes in levels. Monitor patient and counsel on signs of toxicity.  []  Beta blockers: increased risk of bradycardia, AV block and myocardial depression. Sotalol - avoid concomitant use.  []   Calcium channel blockers (diltiazem and verapamil): increased risk of bradycardia, AV block and myocardial depression.  []   Cyclosporine: Amiodarone increases levels of cyclosporine. Reduced dose of cyclosporine is recommended.  []  Digoxin dose should be halved when amiodarone is started.  []  Diuretics: increased risk of cardiotoxicity if hypokalemia occurs.  []  Oral hypoglycemic agents (glyburide, glipizide, glimepiride): increased risk of hypoglycemia. Patient's glucose levels should be monitored closely when initiating amiodarone therapy.   []  Drugs that prolong the QT interval:  Torsades de pointes risk may be increased with concurrent use - avoid if possible.  Monitor QTc, also  keep magnesium/potassium WNL if concurrent therapy can't be avoided. Marland Kitchen Antibiotics: e.g. fluoroquinolones, erythromycin. . Antiarrhythmics: e.g. quinidine, procainamide, disopyramide, sotalol. . Antipsychotics: e.g. phenothiazines, haloperidol.  . Lithium, tricyclic antidepressants, and methadone. Thank You,  Talbert Cage Poteet  07/01/17 9:24 PM     Amiodarone Drug - Drug Interaction Consult Note  Recommendations: No drug interactions noted at the present time Amiodarone is metabolized by the cytochrome P450 system and therefore has the potential to cause many drug interactions. Amiodarone has an average plasma half-life of 50 days (range 20 to 100 days).   There is potential for drug interactions to occur several weeks or months after stopping treatment and the onset of drug interactions may be slow after initiating amiodarone.   []  Statins: Increased risk of myopathy. Simvastatin- restrict dose to 20mg  daily. Other statins: counsel patients to report any muscle pain or weakness immediately.  []  Anticoagulants: Amiodarone can increase anticoagulant effect. Consider warfarin dose reduction. Patients should be monitored closely and the dose of anticoagulant altered accordingly, remembering that amiodarone levels take several weeks to stabilize.  []  Antiepileptics: Amiodarone can increase plasma concentration of phenytoin, the dose should be reduced. Note that small changes in phenytoin dose can result in large changes in levels. Monitor patient and counsel on signs of toxicity.  []  Beta blockers: increased risk of bradycardia, AV block and myocardial depression. Sotalol - avoid concomitant use.  []   Calcium channel blockers (diltiazem and verapamil): increased risk of bradycardia, AV block and myocardial depression.  []   Cyclosporine: Amiodarone increases levels of cyclosporine. Reduced dose of cyclosporine is recommended.  []  Digoxin dose should be halved when amiodarone is  started.  []  Diuretics: increased risk of cardiotoxicity if hypokalemia occurs.  []  Oral hypoglycemic agents (glyburide, glipizide, glimepiride): increased risk of hypoglycemia. Patient's glucose levels  should be monitored closely when initiating amiodarone therapy.   []  Drugs that prolong the QT interval:  Torsades de pointes risk may be increased with concurrent use - avoid if possible.  Monitor QTc, also keep magnesium/potassium WNL if concurrent therapy can't be avoided. Marland Kitchen Antibiotics: e.g. fluoroquinolones, erythromycin. . Antiarrhythmics: e.g. quinidine, procainamide, disopyramide, sotalol. . Antipsychotics: e.g. phenothiazines, haloperidol.  . Lithium, tricyclic antidepressants, and methadone. Thank You,  Talbert Cage Poteet  06/17/2017 9:24 PM

## 2017-06-28 NOTE — ED Notes (Signed)
CRITICAL VALUE ALERT  Critical Value:  Troponin 0.03  Date & Time Notied: 07/09/2017 & 2012 hrs  Provider Notified: Dr. Hyacinth Meeker  Orders Received/Actions taken: N/A

## 2017-06-28 NOTE — ED Notes (Signed)
Patient bagging at this time.

## 2017-06-28 NOTE — ED Provider Notes (Addendum)
[facility] EMERGENCY DEPARTMENT Provider Note   CSN: [id number] Arrival date & time: 07/11/2017  1836     History   Chief Complaint Chief Complaint  Patient presents with  . Respiratory Distress    HPI JAHIR HALT is a 67 y.o. male.  HPI  The patient is a 67 year old male, he is presenting to the hospital with his significant other in acute distress and is unable to give any information.  He presents with severe respiratory distress which the significant other states is worsened over the last couple of days.  He had a recent admission to the hospital for an acute kidney injury, generalized weakness and after some hydration his creatinine returned to close to baseline at 1.4.  He had a VQ scan done during the emergency department visit which was very low probability for PE.  Of note the patient has had a prior MI, prior coronary bypass grafting but also has COPD.  During his admission he was also given antibiotics and treated for COPD exacerbation with medications including Zithromax for which she was discharged with as well.  The patient is unable to give any information, he is in severe distress with severe hypoxia to 50%, he is pale mottled and unable to give any information.  He required immediate intubation  Past Medical History:  Diagnosis Date  . Back pain   . COPD (chronic obstructive pulmonary disease) (HCC)   . Hypertension   . Stroke [employer])    Noted on MRI scan performed at [facility]  . Tobacco abuse     Patient Active Problem List   Diagnosis Date Noted  . COPD with acute exacerbation (HCC) 06/23/2017  . AKI (acute kidney injury) (HCC) 06/23/2017  . Dehydration 06/23/2017  . Syncope due to orthostatic hypotension 06/23/2017  . Syncope 06/23/2017  . TIA (transient ischemic attack) 07/30/2016  . Paroxysmal atrial fibrillation (HCC) 01/28/2016  . Essential hypertension   . COPD (chronic obstructive pulmonary disease) (HCC)   . Stroke (HCC)   . Tobacco abuse   .  CAD (coronary artery disease) of artery bypass graft 01/23/2016  . Chronic anticoagulation 01/23/2016    Past Surgical History:  Procedure Laterality Date  . CARDIAC CATHETERIZATION N/A 01/23/2016   Procedure: Left Heart Cath and Coronary Angiography;  Surgeon: Lyn Records, MD;  Location: [facility] INVASIVE CV LAB;  Service: Cardiovascular;  Laterality: N/A;  . CORONARY ARTERY BYPASS GRAFT N/A 01/25/2016   Procedure: CORONARY ARTERY BYPASS GRAFTING (CABG) times five using left internal mammary artery and right saphenous vein.;  Surgeon: Kerin Perna, MD;  Location: [facility] OR;  Service: Open Heart Surgery;  Laterality: N/A;  . EYE SURGERY    . HERNIA REPAIR    . TEE WITHOUT CARDIOVERSION  01/25/2016   Procedure: TRANSESOPHAGEAL ECHOCARDIOGRAM (TEE);  Surgeon: Kerin Perna, MD;  Location: [facility] OR;  Service: Open Heart Surgery;;       Home Medications    Prior to Admission medications   Medication Sig Start Date End Date Taking? Authorizing Provider  albuterol (PROVENTIL HFA;VENTOLIN HFA) 108 (90 Base) MCG/ACT inhaler Inhale 2 puffs into the lungs every 6 (six) hours as needed for wheezing or shortness of breath.    [provider]  atorvastatin (LIPITOR) 80 MG tablet Take 1 tablet (80 mg total) by mouth daily at 6 PM. Patient taking differently: Take 80 mg by mouth at bedtime.  02/11/16   Leone Brand, NP  azithromycin (ZITHROMAX) 250 MG tablet Take one tablet  daily for 4 days 06/26/17   Philip Aspen, Limmie Patricia, MD  Melatonin 3 MG TABS Take 6 mg by mouth at bedtime.    [provider]  predniSONE (DELTASONE) 10 MG tablet Take 1 tablet (10 mg total) by mouth daily with breakfast. Take 6 tablets today and then decrease by 1 tablet daily until none are left. 06/25/17   Philip Aspen, Limmie Patricia, MD    Family History Family History  Problem Relation Age of Onset  . Cancer Mother   . Heart failure Mother   . Kidney disease Mother   . Aneurysm Father   . Hypertension Father    . Cancer Other   . Breast cancer Sister     Social History Social History   Tobacco Use  . Smoking status: Former Smoker    Packs/day: 1.00    Types: Cigarettes  . Smokeless tobacco: Never Used  Substance Use Topics  . Alcohol use: No    Comment: once a month  . Drug use: No     Allergies   Patient has no known allergies.   Review of Systems Review of Systems  Unable to perform ROS: Acuity of condition     Physical Exam Updated Vital Signs BP 109/61   Pulse 100   Temp 99.9 F (37.7 C)   Resp (!) 30   Ht 6\' 1"  (1.854 m)   Wt 80.7 kg (178 lb)   SpO2 (!) 79%   BMI 23.48 kg/m   Physical Exam  Constitutional:  Severe respiratory distress  HENT:  Cyanotic mucous membranes, dry  Eyes:  Conjunctive are clear, pupils are reactive  Neck:  No lymphadenopathy, trachea midline, no JVD  Cardiovascular:  Micardis 160 bpm, strong pulses at the bilateral radial arteries, no edema  Pulmonary/Chest:  The patient is in severe respiratory distress unable to speak at all.  His oxygenation is 50% on high flow nonrebreather, he is mottled, he has abnormal lung sounds diffusely with rales rhonchi and wheezing.  Abdominal:  Very soft nontender abdomen  Musculoskeletal:  No edema, mottled, no deformities, supple joints  Neurological:  The patient is able to stand and get on the bed but collapses in generalized weakness.  He has able to open his eyes and open his mouth but is unable to speak to answer questions due to respiratory distress  Skin:  Pale cyanotic and mottled     ED Treatments / Results  Labs (all labs ordered are listed, but only abnormal results are displayed) Labs Reviewed  BRAIN NATRIURETIC PEPTIDE - Abnormal; Notable for the following components:      Result Value   B Natriuretic Peptide 267.0 (*)    All other components within normal limits  CBC WITH DIFFERENTIAL/PLATELET - Abnormal; Notable for the following components:   WBC 17.3 (*)     Hemoglobin 12.9 (*)    HCT 38.1 (*)    All other components within normal limits  COMPREHENSIVE METABOLIC PANEL - Abnormal; Notable for the following components:   Sodium 130 (*)    Potassium 3.2 (*)    Chloride 99 (*)    CO2 16 (*)    Glucose, Bld 134 (*)    Creatinine, Ser 1.35 (*)    Calcium 7.7 (*)    Total Protein 6.3 (*)    Albumin 2.7 (*)    AST 55 (*)    Alkaline Phosphatase 149 (*)    Total Bilirubin 1.4 (*)    GFR calc non Af Denyse Dago  52 (*)    All other components within normal limits  TROPONIN I - Abnormal; Notable for the following components:   Troponin I 0.03 (*)    All other components within normal limits  BLOOD GAS, ARTERIAL - Abnormal; Notable for the following components:   pH, Arterial 7.230 (*)    pO2, Arterial 63.1 (*)    Bicarbonate 17.8 (*)    Acid-base deficit 7.2 (*)    All other components within normal limits  URINALYSIS, ROUTINE W REFLEX MICROSCOPIC - Abnormal; Notable for the following components:   APPearance HAZY (*)    Hgb urine dipstick SMALL (*)    Protein, ur 100 (*)    Squamous Epithelial / LPF 0-5 (*)    All other components within normal limits  I-STAT CHEM 8, ED - Abnormal; Notable for the following components:   Potassium 3.4 (*)    Glucose, Bld 129 (*)    Calcium, Ion 1.06 (*)    TCO2 19 (*)    Hemoglobin 12.6 (*)    HCT 37.0 (*)    All other components within normal limits  CULTURE, BLOOD (ROUTINE X 2)  CULTURE, BLOOD (ROUTINE X 2)  URINE CULTURE  I-STAT TROPONIN, ED  I-STAT CG4 LACTIC ACID, ED    ED ECG REPORT  I personally interpreted this EKG   Date: 07/11/2017   Rate: 156  Rhythm: indeterminate  QRS Axis: normal  Intervals: normal  ST/T Wave abnormalities: nonspecific ST/T changes  Conduction Disutrbances:none  Narrative Interpretation:   Old EKG Reviewed: changes noted - now much faster, poor baseline   Radiology Dg Chest Portable 1 View  Result Date: 07/11/17 CLINICAL DATA:  OG tube placement.  COPD EXAM:  PORTABLE CHEST 1 VIEW COMPARISON:  07/11/2017 FINDINGS: Endotracheal tube unchanged. Introduction of nasogastric tube which extends the stomach. Dense airspace disease on the LEFT. Diffuse airspace disease on the RIGHT. IMPRESSION: 1. Introduction of NG tube which extends into the stomach. 2. Endotracheal tube in good position. 3. Dense bilateral airspace disease. Electronically Signed   By: Genevive Bi M.D.   On: 07/11/17 19:28   Dg Chest Portable 1 View  Result Date: 11-Jul-2017 CLINICAL DATA:  67 year old male who presented and respiratory distress. Intubated. EXAM: PORTABLE CHEST 1 VIEW COMPARISON:  06/23/2017 and earlier. FINDINGS: Portable AP supine view at 1850 hrs. Endotracheal tube tip in good position between the level the clavicles and carina. If an enteric tube is in place, it does not extend below the T1 thoracic inlet level. Stable lung volumes compared to 06/23/2017 but new diffuse bilateral pulmonary interstitial opacity. Probable left lower lobe and perihilar consolidation. Additional patchy/confluent opacity in the inferior aspect of the right upper lobe, and the right lower lobe. No pleural effusion is evident. Grossly stable mediastinal contours. Prior CABG. No pneumothorax. IMPRESSION: 1. Endotracheal tube tip in good position. If an enteric tube is in place it does not extend below the T1/thoracic inlet level. 2. Multi lobar consolidation in the left lung with superimposed acute bilateral pulmonary interstitial opacity, and patchy opacity in the right lung. Favor acute multifocal pneumonia. Consider superimposed pulmonary edema. No pleural effusion is evident. Electronically Signed   By: Odessa Fleming M.D.   On: 07/11/2017 19:14    Procedures .Critical Care Performed by: Eber Hong, MD Authorized by: Eber Hong, MD   Critical care provider statement:    Critical care time (minutes):  80   Critical care time was exclusive of:  Separately billable procedures and treating other  patients and teaching time   Critical care was necessary to treat or prevent imminent or life-threatening deterioration of the following conditions:  Respiratory failure and sepsis   Critical care was time spent personally by me on the following activities:  Blood draw for specimens, development of treatment plan with patient or surrogate, discussions with consultants, evaluation of patient's response to treatment, examination of patient, obtaining history from patient or surrogate, ordering and performing treatments and interventions, ordering and review of laboratory studies, ordering and review of radiographic studies, pulse oximetry, re-evaluation of patient's condition and review of old charts Procedure Name: Intubation Date/Time: 06-30-17 7:22 PM Performed by: Eber Hong, MD Pre-anesthesia Checklist: Patient identified, Patient being monitored, Emergency Drugs available, Timeout performed and Suction available Oxygen Delivery Method: Non-rebreather mask Preoxygenation: Pre-oxygenation with 100% oxygen Induction Type: Rapid sequence Ventilation: Mask ventilation without difficulty Laryngoscope Size: 4 Grade View: Grade I Tube size: 7.5 mm Number of attempts: 1 Airway Equipment and Method: Stylet Placement Confirmation: ETT inserted through vocal cords under direct vision,  CO2 detector and Breath sounds checked- equal and bilateral Secured at: 23 cm Tube secured with: ETT holder Dental Injury: Teeth and Oropharynx as per pre-operative assessment  Comments:        .Central Line Date/Time: 06-30-2017 7:23 PM Performed by: Eber Hong, MD Authorized by: Eber Hong, MD   Consent:    Consent obtained:  Emergent situation Pre-procedure details:    Hand hygiene: Hand hygiene performed prior to insertion     Skin preparation:  2% chlorhexidine   Skin preparation agent: Skin preparation agent completely dried prior to procedure   Anesthesia (see MAR for exact dosages):     Anesthesia method:  None Procedure details:    Location:  R femoral   Patient position:  Flat   Procedural supplies:  Triple lumen   Landmarks identified: yes     Ultrasound guidance: no     Number of attempts:  1   Successful placement: yes   Post-procedure details:    Post-procedure:  Dressing applied and line sutured   Assessment:  Blood return through all ports and free fluid flow   Patient tolerance of procedure:  Tolerated well, no immediate complications Comments:         OG placement Date/Time: 06/30/2017 8:35 PM Performed by: Eber Hong, MD Authorized by: Eber Hong, MD  Consent: The procedure was performed in an emergent situation. Required items: required blood products, implants, devices, and special equipment available Patient identity confirmed: provided demographic data Time out: Immediately prior to procedure a "time out" was called to verify the correct patient, procedure, equipment, support staff and site/side marked as required. Preparation: Patient was prepped and draped in the usual sterile fashion. Local anesthesia used: no  Anesthesia: Local anesthesia used: no  Sedation: Patient sedated: yes Sedation: etomidate (from intubation) Vitals: Vital signs were monitored during sedation.  Patient tolerance: Patient tolerated the procedure well with no immediate complications    (including critical care time)  Medications Ordered in ED Medications  norepinephrine (LEVOPHED) 4 mg in dextrose 5 % 250 mL (0.016 mg/mL) infusion (2 mcg/min Intravenous New Bag/Given 30-Jun-2017 2027)  sodium chloride 0.9 % bolus 1,000 mL (0 mLs Intravenous Stopped June 30, 2017 2009)    And  sodium chloride 0.9 % bolus 1,000 mL (1,000 mLs Intravenous New Bag/Given 06-30-2017 1953)    And  sodium chloride 0.9 % bolus 500 mL (not administered)  midazolam (VERSED) 50 mg in sodium chloride 0.9 % 50 mL (1 mg/mL)  infusion (not administered)  fentaNYL (SUBLIMAZE) 2,500 mcg in sodium  chloride 0.9 % 250 mL (10 mcg/mL) infusion (not administered)  etomidate (AMIDATE) injection 20 mg (20 mg Intravenous Given Jul 23, 2017 1851)  succinylcholine (ANECTINE) injection 100 mg (100 mg Intravenous Given July 23, 2017 1852)  vancomycin (VANCOCIN) IVPB 1000 mg/200 mL premix (1,000 mg Intravenous New Bag/Given Jul 23, 2017 1928)  piperacillin-tazobactam (ZOSYN) IVPB 3.375 g (0 g Intravenous Stopped 07-23-17 2002)  rocuronium (ZEMURON) injection 50 mg (50 mg Intravenous Given 2017/07/23 1905)  propofol (DIPRIVAN) 1000 MG/100ML infusion (60 mcg/kg/min  80.7 kg Intravenous Rate/Dose Change 07/23/2017 1950)  midazolam (VERSED) injection 2 mg (2 mg Intravenous Given Jul 23, 2017 1929)  midazolam (VERSED) injection 2 mg (2 mg Intravenous Given 07/23/17 2023)     Initial Impression / Assessment and Plan / ED Course  I have reviewed the triage vital signs and the nursing notes.  Pertinent labs & imaging results that were available during my care of the patient were reviewed by me and considered in my medical decision making (see chart for details).    I personally reviewed the two-view PA and lateral chest x-ray and believe there to be multifocal infiltrates, he has the appearance of respiratory distress syndrome and is lungs with an acute lung injury.  He has severe hypoxia requiring intubation and even on high flow his oxygenation only comes up to 85%.  His blood pressure was initially severely elevated while he was in distress.  This came down significantly after intubation, paralyzation and sedation.  Labs drawn, the patient will likely need to be admitted to an intensive care unit as he is severely life-threatening ill with severe life-threatening acute pulmonary process.  This is less likely to be congestive heart failure.  Critical care provided  Central line placed due to poor peripheral access, only a 22-gauge IV was used in the initial intubation as there was no other peripheral IV access.  See separate note  I  placed the NG tube after the first NG tube that was placed by nursing, coiled in the mouth.  Second NG tube was confirmed on x-ray.  Critical care paged at 7:35 PM  Levophed started at 7:45 for progressive hypotension BP of 100 systolic at this point.  Pt with ongoing agitation and needing sedation despite 50 of propofol drip.  Dr. Darrick Penna requests 4 amps of bicarb (200), and requests some changes in vent setting prior to transfer.  I have ordered this.  Versed and fentanyl drips initiated.  Discussed with critical care at 7:50 PM, they have accepted the patient however there is no beds available.  He will be placed in the intensive care unit here until a bed becomes available.  Discussed with the hospitalist at 8:30 PM, he is agreeable to admission to the intensive care unit.  Review of the lab work shows white blood cell count of over 17,000, lactic acid is still pending  The patient has been given his complete fluid bolus and still remains borderline hypotensive thus pressors were started.  Final Clinical Impressions(s) / ED Diagnoses   Final diagnoses:  Septic shock (HCC)  Acute respiratory failure with hypoxia (HCC)      Eber Hong, MD 07/23/17 0998    Eber Hong, MD 07-23-17 2202

## 2017-06-29 ENCOUNTER — Inpatient Hospital Stay (HOSPITAL_COMMUNITY): Payer: Medicare HMO

## 2017-06-29 ENCOUNTER — Other Ambulatory Visit (HOSPITAL_COMMUNITY): Payer: Medicare HMO

## 2017-06-29 DIAGNOSIS — R6521 Severe sepsis with septic shock: Secondary | ICD-10-CM

## 2017-06-29 DIAGNOSIS — J80 Acute respiratory distress syndrome: Secondary | ICD-10-CM

## 2017-06-29 DIAGNOSIS — J9621 Acute and chronic respiratory failure with hypoxia: Secondary | ICD-10-CM

## 2017-06-29 DIAGNOSIS — J9601 Acute respiratory failure with hypoxia: Secondary | ICD-10-CM

## 2017-06-29 DIAGNOSIS — A419 Sepsis, unspecified organism: Principal | ICD-10-CM

## 2017-06-29 LAB — RESPIRATORY PANEL BY PCR
ADENOVIRUS-RVPPCR: NOT DETECTED
BORDETELLA PERTUSSIS-RVPCR: NOT DETECTED
CHLAMYDOPHILA PNEUMONIAE-RVPPCR: NOT DETECTED
CORONAVIRUS HKU1-RVPPCR: NOT DETECTED
CORONAVIRUS NL63-RVPPCR: NOT DETECTED
Coronavirus 229E: NOT DETECTED
Coronavirus OC43: NOT DETECTED
INFLUENZA A-RVPPCR: NOT DETECTED
Influenza B: NOT DETECTED
MYCOPLASMA PNEUMONIAE-RVPPCR: NOT DETECTED
Metapneumovirus: NOT DETECTED
Parainfluenza Virus 1: NOT DETECTED
Parainfluenza Virus 2: NOT DETECTED
Parainfluenza Virus 3: NOT DETECTED
Parainfluenza Virus 4: NOT DETECTED
Respiratory Syncytial Virus: NOT DETECTED
Rhinovirus / Enterovirus: NOT DETECTED

## 2017-06-29 LAB — GLUCOSE, CAPILLARY
GLUCOSE-CAPILLARY: 303 mg/dL — AB (ref 65–99)
GLUCOSE-CAPILLARY: 282 mg/dL — AB (ref 65–99)
GLUCOSE-CAPILLARY: 222 mg/dL — AB (ref 65–99)
GLUCOSE-CAPILLARY: 144 mg/dL — AB (ref 65–99)
GLUCOSE-CAPILLARY: 122 mg/dL — AB (ref 65–99)
GLUCOSE-CAPILLARY: 122 mg/dL — AB (ref 65–99)
Glucose-Capillary: 121 mg/dL — ABNORMAL HIGH (ref 65–99)
Glucose-Capillary: 132 mg/dL — ABNORMAL HIGH (ref 65–99)
Glucose-Capillary: 190 mg/dL — ABNORMAL HIGH (ref 65–99)
Glucose-Capillary: 240 mg/dL — ABNORMAL HIGH (ref 65–99)
Glucose-Capillary: 304 mg/dL — ABNORMAL HIGH (ref 65–99)
Glucose-Capillary: 321 mg/dL — ABNORMAL HIGH (ref 65–99)

## 2017-06-29 LAB — PHOSPHORUS: PHOSPHORUS: 6.1 mg/dL — AB (ref 2.5–4.6)

## 2017-06-29 LAB — CBC
HCT: 36.1 % — ABNORMAL LOW (ref 39.0–52.0)
Hemoglobin: 12.2 g/dL — ABNORMAL LOW (ref 13.0–17.0)
MCH: 28.6 pg (ref 26.0–34.0)
MCHC: 33.8 g/dL (ref 30.0–36.0)
MCV: 84.7 fL (ref 78.0–100.0)
PLATELETS: 170 10*3/uL (ref 150–400)
RBC: 4.26 MIL/uL (ref 4.22–5.81)
RDW: 15.9 % — ABNORMAL HIGH (ref 11.5–15.5)
WBC: 20.7 10*3/uL — AB (ref 4.0–10.5)

## 2017-06-29 LAB — POCT I-STAT 3, ART BLOOD GAS (G3+)
ACID-BASE EXCESS: 2 mmol/L (ref 0.0–2.0)
ACID-BASE EXCESS: 2 mmol/L (ref 0.0–2.0)
Acid-base deficit: 3 mmol/L — ABNORMAL HIGH (ref 0.0–2.0)
Acid-base deficit: 7 mmol/L — ABNORMAL HIGH (ref 0.0–2.0)
BICARBONATE: 29.7 mmol/L — AB (ref 20.0–28.0)
Bicarbonate: 22 mmol/L (ref 20.0–28.0)
Bicarbonate: 22.9 mmol/L (ref 20.0–28.0)
Bicarbonate: 29.1 mmol/L — ABNORMAL HIGH (ref 20.0–28.0)
O2 SAT: 91 %
O2 Saturation: 98 %
O2 Saturation: 98 %
O2 Saturation: 97 %
PCO2 ART: 67.1 mmHg — AB (ref 32.0–48.0)
PCO2 ART: 62 mmHg — AB (ref 32.0–48.0)
PCO2 ART: 55.5 mmHg — AB (ref 32.0–48.0)
PH ART: 7.346 — AB (ref 7.350–7.450)
PH ART: 7.14 — AB (ref 7.350–7.450)
PH ART: 7.288 — AB (ref 7.350–7.450)
PO2 ART: 118 mmHg — AB (ref 83.0–108.0)
Patient temperature: 36.9
TCO2: 23 mmol/L (ref 22–32)
TCO2: 25 mmol/L (ref 22–32)
TCO2: 32 mmol/L (ref 22–32)
TCO2: 31 mmol/L (ref 22–32)
pCO2 arterial: 40.3 mmHg (ref 32.0–48.0)
pH, Arterial: 7.328 — ABNORMAL LOW (ref 7.350–7.450)
pO2, Arterial: 65 mmHg — ABNORMAL LOW (ref 83.0–108.0)
pO2, Arterial: 140 mmHg — ABNORMAL HIGH (ref 83.0–108.0)
pO2, Arterial: 96 mmHg (ref 83.0–108.0)

## 2017-06-29 LAB — BASIC METABOLIC PANEL
ANION GAP: 11 (ref 5–15)
BUN: 22 mg/dL — ABNORMAL HIGH (ref 6–20)
CO2: 22 mmol/L (ref 22–32)
CREATININE: 1.78 mg/dL — AB (ref 0.61–1.24)
Calcium: 6.8 mg/dL — ABNORMAL LOW (ref 8.9–10.3)
Chloride: 107 mmol/L (ref 101–111)
GFR, EST AFRICAN AMERICAN: 43 mL/min — AB (ref >60)
GFR, EST NON AFRICAN AMERICAN: 37 mL/min — AB (ref >60)
Glucose, Bld: 221 mg/dL — ABNORMAL HIGH (ref 65–99)
Potassium: 4.6 mmol/L (ref 3.5–5.1)
SODIUM: 140 mmol/L (ref 135–145)

## 2017-06-29 LAB — MAGNESIUM: Magnesium: 2.2 mg/dL (ref 1.7–2.4)

## 2017-06-29 LAB — MRSA PCR SCREENING: MRSA BY PCR: NEGATIVE

## 2017-06-29 MED ORDER — SODIUM CHLORIDE 0.9 % IV SOLN
0.0000 ug/min | INTRAVENOUS | Status: DC
  Administered 2017-06-29: 40 ug/min via INTRAVENOUS
  Administered 2017-06-29: 200 ug/min via INTRAVENOUS
  Administered 2017-06-29: 100 ug/min via INTRAVENOUS
  Administered 2017-06-29: 200 ug/min via INTRAVENOUS
  Administered 2017-07-03: 20 ug/min via INTRAVENOUS
  Administered 2017-07-04: 40 ug/min via INTRAVENOUS
  Filled 2017-06-29: qty 4
  Filled 2017-06-29: qty 40
  Filled 2017-06-29 (×5): qty 4

## 2017-06-29 MED ORDER — SODIUM CHLORIDE 0.9 % IV SOLN
INTRAVENOUS | Status: DC
  Administered 2017-06-29: 2.4 [IU]/h via INTRAVENOUS
  Filled 2017-06-29 (×2): qty 1

## 2017-06-29 MED ORDER — ORAL CARE MOUTH RINSE
15.0000 mL | OROMUCOSAL | Status: DC
  Administered 2017-06-29 – 2017-07-10 (×116): 15 mL via OROMUCOSAL

## 2017-06-29 MED ORDER — NOREPINEPHRINE BITARTRATE 1 MG/ML IV SOLN
0.0000 ug/min | INTRAVENOUS | Status: DC
  Administered 2017-06-29: 2 ug/min via INTRAVENOUS
  Administered 2017-06-29: 8 ug/min via INTRAVENOUS
  Administered 2017-06-29: 6 ug/min via INTRAVENOUS
  Administered 2017-07-05: 2 ug/min via INTRAVENOUS
  Filled 2017-06-29 (×6): qty 4

## 2017-06-29 MED ORDER — MIDAZOLAM HCL 2 MG/2ML IJ SOLN
2.0000 mg | Freq: Once | INTRAMUSCULAR | Status: AC | PRN
  Administered 2017-07-06: 2 mg via INTRAVENOUS

## 2017-06-29 MED ORDER — FENTANYL CITRATE (PF) 100 MCG/2ML IJ SOLN
50.0000 ug | Freq: Once | INTRAMUSCULAR | Status: AC
  Administered 2017-06-29: 50 ug via INTRAVENOUS

## 2017-06-29 MED ORDER — SODIUM CHLORIDE 0.9 % IV SOLN
3.0000 ug/kg/min | INTRAVENOUS | Status: DC
  Administered 2017-06-29: 1.5 ug/kg/min via INTRAVENOUS
  Administered 2017-06-29: 3 ug/kg/min via INTRAVENOUS
  Administered 2017-07-03: 3.5 ug/kg/min via INTRAVENOUS
  Administered 2017-07-03: 3 ug/kg/min via INTRAVENOUS
  Administered 2017-07-04: 4 ug/kg/min via INTRAVENOUS
  Administered 2017-07-05: 4.5 ug/kg/min via INTRAVENOUS
  Administered 2017-07-06: 5 ug/kg/min via INTRAVENOUS
  Administered 2017-07-06: 6 ug/kg/min via INTRAVENOUS
  Administered 2017-07-06 – 2017-07-07 (×4): 5 ug/kg/min via INTRAVENOUS
  Administered 2017-07-08: 7 ug/kg/min via INTRAVENOUS
  Administered 2017-07-08 (×2): 5 ug/kg/min via INTRAVENOUS
  Administered 2017-07-09: 8 ug/kg/min via INTRAVENOUS
  Filled 2017-06-29 (×18): qty 20

## 2017-06-29 MED ORDER — HEPARIN SODIUM (PORCINE) 5000 UNIT/ML IJ SOLN
5000.0000 [IU] | Freq: Three times a day (TID) | INTRAMUSCULAR | Status: DC
  Administered 2017-06-29 – 2017-07-10 (×34): 5000 [IU] via SUBCUTANEOUS
  Filled 2017-06-29 (×35): qty 1

## 2017-06-29 MED ORDER — VITAL HIGH PROTEIN PO LIQD: 1000.0000 mL | ORAL | Status: DC

## 2017-06-29 MED ORDER — FENTANYL CITRATE (PF) 100 MCG/2ML IJ SOLN
100.0000 ug | Freq: Once | INTRAMUSCULAR | Status: AC
  Administered 2017-06-29: 100 ug via INTRAVENOUS

## 2017-06-29 MED ORDER — PIPERACILLIN-TAZOBACTAM 3.375 G IVPB
3.3750 g | Freq: Three times a day (TID) | INTRAVENOUS | Status: DC
  Administered 2017-06-29 – 2017-06-30 (×4): 3.375 g via INTRAVENOUS
  Filled 2017-06-29 (×5): qty 50

## 2017-06-29 MED ORDER — MIDAZOLAM BOLUS VIA INFUSION
1.0000 mg | INTRAVENOUS | Status: DC | PRN
  Filled 2017-06-29: qty 2

## 2017-06-29 MED ORDER — SODIUM CHLORIDE 0.9% FLUSH
3.0000 mL | Freq: Two times a day (BID) | INTRAVENOUS | Status: DC
  Administered 2017-06-29 – 2017-07-06 (×12): 3 mL via INTRAVENOUS

## 2017-06-29 MED ORDER — CHLORHEXIDINE GLUCONATE CLOTH 2 % EX PADS
6.0000 | MEDICATED_PAD | Freq: Every day | CUTANEOUS | Status: DC
  Administered 2017-06-30 – 2017-07-09 (×11): 6 via TOPICAL

## 2017-06-29 MED ORDER — VITAL AF 1.2 CAL PO LIQD
1000.0000 mL | ORAL | Status: DC
  Administered 2017-06-29 – 2017-07-05 (×6): 1000 mL

## 2017-06-29 MED ORDER — FENTANYL CITRATE (PF) 100 MCG/2ML IJ SOLN
100.0000 ug | Freq: Once | INTRAMUSCULAR | Status: AC | PRN
  Administered 2017-07-06: 100 ug via INTRAVENOUS

## 2017-06-29 MED ORDER — FENTANYL BOLUS VIA INFUSION
50.0000 ug | INTRAVENOUS | Status: DC | PRN
  Administered 2017-07-02 – 2017-07-10 (×28): 50 ug via INTRAVENOUS
  Filled 2017-06-29 (×2): qty 50

## 2017-06-29 MED ORDER — CHLORHEXIDINE GLUCONATE 0.12% ORAL RINSE (MEDLINE KIT)
15.0000 mL | Freq: Two times a day (BID) | OROMUCOSAL | Status: DC
  Administered 2017-06-29 – 2017-07-10 (×24): 15 mL via OROMUCOSAL

## 2017-06-29 MED ORDER — ARTIFICIAL TEARS OPHTHALMIC OINT
1.0000 "application " | TOPICAL_OINTMENT | Freq: Three times a day (TID) | OPHTHALMIC | Status: DC
  Administered 2017-06-29 – 2017-06-30 (×6): 1 via OPHTHALMIC
  Filled 2017-06-29 (×2): qty 3.5

## 2017-06-29 MED ORDER — MIDAZOLAM BOLUS VIA INFUSION
2.0000 mg | INTRAVENOUS | Status: DC | PRN
  Administered 2017-07-03 – 2017-07-10 (×27): 2 mg via INTRAVENOUS
  Filled 2017-06-29: qty 2

## 2017-06-29 MED ORDER — SODIUM CHLORIDE 0.9 % IV SOLN: 250.0000 mL | INTRAVENOUS | Status: DC | PRN

## 2017-06-29 MED ORDER — SODIUM CHLORIDE 0.9% FLUSH
10.0000 mL | Freq: Two times a day (BID) | INTRAVENOUS | Status: DC
  Administered 2017-06-29 – 2017-07-01 (×4): 10 mL
  Administered 2017-07-01: 30 mL
  Administered 2017-07-02 – 2017-07-03 (×3): 10 mL
  Administered 2017-07-03: 30 mL
  Administered 2017-07-04: 10 mL
  Administered 2017-07-04: 30 mL
  Administered 2017-07-05: 10 mL
  Administered 2017-07-05: 30 mL
  Administered 2017-07-06: 10 mL

## 2017-06-29 MED ORDER — SODIUM BICARBONATE 8.4 % IV SOLN
200.0000 meq | Freq: Once | INTRAVENOUS | Status: AC
  Administered 2017-06-29: 200 meq via INTRAVENOUS
  Filled 2017-06-29: qty 200

## 2017-06-29 MED ORDER — MIDAZOLAM HCL 2 MG/2ML IJ SOLN
2.0000 mg | Freq: Once | INTRAMUSCULAR | Status: AC
  Administered 2017-06-29: 2 mg via INTRAVENOUS

## 2017-06-29 MED ORDER — SODIUM CHLORIDE 0.9% FLUSH
10.0000 mL | INTRAVENOUS | Status: DC | PRN
  Administered 2017-06-29: 10 mL
  Filled 2017-06-29: qty 40

## 2017-06-29 MED ORDER — FENTANYL 2500MCG IN NS 250ML (10MCG/ML) PREMIX INFUSION
100.0000 ug/h | INTRAVENOUS | Status: DC
  Administered 2017-06-29 – 2017-06-30 (×5): 300 ug/h via INTRAVENOUS
  Administered 2017-07-01 (×2): 250 ug/h via INTRAVENOUS
  Administered 2017-07-01: 275 ug/h via INTRAVENOUS
  Administered 2017-07-02: 250 ug/h via INTRAVENOUS
  Administered 2017-07-02: 200 ug/h via INTRAVENOUS
  Administered 2017-07-03: 275 ug/h via INTRAVENOUS
  Administered 2017-07-03 (×2): 300 ug/h via INTRAVENOUS
  Administered 2017-07-04 (×2): 275 ug/h via INTRAVENOUS
  Administered 2017-07-04: 300 ug/h via INTRAVENOUS
  Administered 2017-07-05: 250 ug/h via INTRAVENOUS
  Administered 2017-07-05 (×2): 300 ug/h via INTRAVENOUS
  Administered 2017-07-06: 250 ug/h via INTRAVENOUS
  Administered 2017-07-06 – 2017-07-07 (×6): 300 ug/h via INTRAVENOUS
  Administered 2017-07-08 (×2): 250 ug/h via INTRAVENOUS
  Administered 2017-07-08 – 2017-07-09 (×2): 300 ug/h via INTRAVENOUS
  Administered 2017-07-09: 325 ug/h via INTRAVENOUS
  Administered 2017-07-09: 300 ug/h via INTRAVENOUS
  Administered 2017-07-10 (×3): 400 ug/h via INTRAVENOUS
  Filled 2017-06-29 (×36): qty 250

## 2017-06-29 MED ORDER — SODIUM CHLORIDE 0.9% FLUSH: 3.0000 mL | INTRAVENOUS | Status: DC | PRN

## 2017-06-29 MED ORDER — SODIUM BICARBONATE 8.4 % IV SOLN
INTRAVENOUS | Status: DC
  Administered 2017-06-29: 07:00:00 via INTRAVENOUS
  Filled 2017-06-29 (×2): qty 150

## 2017-06-29 MED ORDER — INSULIN ASPART 100 UNIT/ML ~~LOC~~ SOLN: 2.0000 [IU] | SUBCUTANEOUS | Status: DC

## 2017-06-29 MED ORDER — VANCOMYCIN HCL IN DEXTROSE 750-5 MG/150ML-% IV SOLN
750.0000 mg | Freq: Two times a day (BID) | INTRAVENOUS | Status: DC
  Administered 2017-06-29 – 2017-07-02 (×7): 750 mg via INTRAVENOUS
  Filled 2017-06-29 (×7): qty 150

## 2017-06-29 MED ORDER — FENTANYL BOLUS VIA INFUSION
25.0000 ug | INTRAVENOUS | Status: DC | PRN
  Filled 2017-06-29: qty 25

## 2017-06-29 MED ORDER — FENTANYL 2500MCG IN NS 250ML (10MCG/ML) PREMIX INFUSION: 25.0000 ug/h | INTRAVENOUS | Status: DC

## 2017-06-29 MED ORDER — SODIUM CHLORIDE 0.9 % IV SOLN
2.0000 mg/h | INTRAVENOUS | Status: DC
  Administered 2017-06-29: 10 mg/h via INTRAVENOUS
  Administered 2017-06-29 (×2): 8 mg/h via INTRAVENOUS
  Administered 2017-06-30: 5 mg/h via INTRAVENOUS
  Administered 2017-07-01: 6 mg/h via INTRAVENOUS
  Administered 2017-07-02: 5 mg/h via INTRAVENOUS
  Administered 2017-07-02: 4 mg/h via INTRAVENOUS
  Administered 2017-07-03 (×4): 10 mg/h via INTRAVENOUS
  Administered 2017-07-04: 6 mg/h via INTRAVENOUS
  Administered 2017-07-04 – 2017-07-06 (×4): 10 mg/h via INTRAVENOUS
  Administered 2017-07-06: 6 mg/h via INTRAVENOUS
  Administered 2017-07-07: 10 mg/h via INTRAVENOUS
  Administered 2017-07-07 – 2017-07-08 (×3): 8 mg/h via INTRAVENOUS
  Administered 2017-07-09 – 2017-07-10 (×4): 10 mg/h via INTRAVENOUS
  Filled 2017-06-29: qty 10
  Filled 2017-06-29 (×7): qty 20
  Filled 2017-06-29 (×4): qty 10
  Filled 2017-06-29 (×2): qty 20
  Filled 2017-06-29 (×3): qty 10
  Filled 2017-06-29: qty 20
  Filled 2017-06-29 (×2): qty 10
  Filled 2017-06-29 (×2): qty 20
  Filled 2017-06-29 (×2): qty 10
  Filled 2017-06-29: qty 20
  Filled 2017-06-29: qty 10
  Filled 2017-06-29: qty 20
  Filled 2017-06-29 (×2): qty 10

## 2017-06-29 MED ORDER — PANTOPRAZOLE SODIUM 40 MG IV SOLR
40.0000 mg | Freq: Every day | INTRAVENOUS | Status: DC
  Administered 2017-06-29 – 2017-06-30 (×2): 40 mg via INTRAVENOUS
  Filled 2017-06-29 (×2): qty 40

## 2017-06-29 MED ORDER — SODIUM CHLORIDE 0.9 % IV SOLN
0.0000 mg/h | INTRAVENOUS | Status: DC
  Administered 2017-06-29: 9 mg/h via INTRAVENOUS
  Filled 2017-06-29: qty 10

## 2017-06-29 MED ORDER — CISATRACURIUM BOLUS VIA INFUSION
5.0000 mg | Freq: Once | INTRAVENOUS | Status: AC
  Administered 2017-06-29: 5 mg via INTRAVENOUS
  Filled 2017-06-29: qty 5

## 2017-06-29 MED ORDER — POTASSIUM CHLORIDE 10 MEQ/100ML IV SOLN
10.0000 meq | INTRAVENOUS | Status: AC
  Administered 2017-06-29 (×4): 10 meq via INTRAVENOUS
  Filled 2017-06-29 (×4): qty 100

## 2017-06-29 MED FILL — Medication: Qty: 1 | Status: AC

## 2017-06-29 NOTE — Progress Notes (Addendum)
Panic ABG results called to Dr. Darrick Penna, orders received for 4 amps of bicarb and to start a bicarb gtt. Will implement orders and continue to monitor.  Herma Ard, RN

## 2017-06-29 NOTE — Progress Notes (Signed)
PULMONARY / CRITICAL CARE MEDICINE   Name: Darryl George MRN: [id number] DOB: Nov 23, 1950    ADMISSION DATE:  2017-07-02 CONSULTATION DATE:  02-Jul-2017  REFERRING MD:  Dr. Marthann Schiller Penn  CHIEF COMPLAINT:  hypoxia  HISTORY OF PRESENT ILLNESS:   67 year old male with past medical history as below, which is significant for COPD, hypertension, stroke, and tobacco abuse.  He was recently admitted for COPD exacerbation in which she was discharged (2/10) on 4-6 L of supplemental oxygen, azithromycin, and a steroid taper.  He then presented to [facility] emergency department on 2/13 with complaints of severe dyspnea.  He was found to hypoxemic with oxygen saturations in the 50s on a nonrebreather.  He was emergently intubated.  Also developed atrial fibrillation with rapid ventricular rate and was started on amiodarone.  Chest x-ray demonstrated severe diffuse infiltrates.  He immediately required high FiO2 and high PEEP in order to maintain adequate saturations.  He was started on broad-spectrum antibiotics and deeply sedated for vent synchrony.  He required levophed for blood pressure support.  He was transferred to [facility] for higher level ICU care in the setting of presumed ARDS.   SUBJECTIVE:  Paralyzed overnight for vent dyssynchrony  VITAL SIGNS: BP 106/66   Pulse (!) 58   Temp 98.2 F (36.8 C)   Resp (!) 30   Ht 6\' 1"  (1.854 m)   Wt 182 lb 12.2 oz (82.9 kg)   SpO2 98%   BMI 24.11 kg/m   HEMODYNAMICS:    VENTILATOR SETTINGS: Vent Mode: PRVC FiO2 (%):  [70 %-100 %] 70 % Set Rate:  [18 bmp-30 bmp] 30 bmp Vt Set:  [400 mL-640 mL] 480 mL PEEP:  [8 cmH20-18 cmH20] 16 cmH20 Plateau Pressure:  [25 cmH20-28 cmH20] 25 cmH20  INTAKE / OUTPUT: I/O last 3 completed shifts: In: 5046.2 [I.V.:1696.2; IV Piggyback:3350] Out: 270 [Urine:270]  PHYSICAL EXAMINATION: General:  Elderly male in NAD, sedated and paralyzed on the vent Neuro:  Sedated and paralyzed HEENT:  [place]/AT, PERRL, no  JVD Cardiovascular:  RRR, Nl S1/S2 and -M/R/G. Lungs:  Diffuse crackles Abdomen:  Soft, non-distened Musculoskeletal:  No acute deformity or ROM limitation Skin:  Grossly intact  LABS:  BMET Recent Labs  Lab 06/25/17 0630 02-Jul-2017 1912 July 02, 2017 1915 06/29/17 0500  NA 135 137 130* 140  K 3.8 3.4* 3.2* 4.6  CL 108 105 99* 107  CO2 15*  --  16* 22  BUN 32* 20 20 22*  CREATININE 1.44* 1.20 1.35* 1.78*  GLUCOSE 145* 129* 134* 221*   Electrolytes Recent Labs  Lab 06/25/17 0630 Jul 02, 2017 1915 06/29/17 0500  CALCIUM 7.8* 7.7* 6.8*  MG  --   --  2.2  PHOS  --   --  6.1*   CBC Recent Labs  Lab 06/24/17 0636 Jul 02, 2017 1912 Jul 02, 2017 1915 06/29/17 0500  WBC 3.4*  --  17.3* 20.7*  HGB 14.2 12.6* 12.9* 12.2*  HCT 41.2 37.0* 38.1* 36.1*  PLT 143*  --  153 170   Coag's No results for input(s): APTT, INR in the last 168 hours.  Sepsis Markers Recent Labs  Lab 06/23/17 1754 07-02-17 2146  LATICACIDVEN 0.71 0.97   ABG Recent Labs  Lab 07/02/17 2215 06/29/17 0113 06/29/17 0552  PHART 7.456* 7.346* 7.140*  PCO2ART 40.7 40.3 67.1*  PO2ART 48.9* 65.0* 140.0*   Liver Enzymes Recent Labs  Lab 06/23/17 1322 July 02, 2017 1915  AST 45* 55*  ALT 28 44  ALKPHOS 97 149*  BILITOT 0.9  1.4*  ALBUMIN 3.5 2.7*   Cardiac Enzymes Recent Labs  Lab 06/23/17 1322 06/23/17 1807 06/30/2017 1915  TROPONINI 0.03* 0.03* 0.03*   Glucose No results for input(s): GLUCAP in the last 168 hours.  Imaging Ct Chest Wo Contrast  Result Date: 06/29/2017 CLINICAL DATA:  Acute respiratory illness. Dense consolidation, rule out lung abscess. EXAM: CT CHEST WITHOUT CONTRAST TECHNIQUE: Multidetector CT imaging of the chest was performed following the standard protocol without IV contrast. COMPARISON:  Radiographs yesterday, additional priors. FINDINGS: Cardiovascular: Aortic atherosclerosis and tortuosity. No periaortic stranding. Post CABG with calcifications of native coronary arteries. Mild  cardiomegaly. No pericardial effusion. Mediastinum/Nodes: Small mediastinal nodes, largest in the AP window measures 10 mm. Limited assessment for hilar adenopathy given lack of IV contrast. Endotracheal and enteric tubes in place. No visualized thyroid nodule. Lungs/Pleura: Moderate emphysema. Multilobar bronchiectasis. Multifocal ground-glass, consolidative, and nodular opacities throughout all lobes of both lungs. Improved left lower lobe aeration from prior radiographs. No dense consolidation or lung abscess. Mild dependent pleural thickening without frank pleural effusion. Upper Abdomen: No acute abnormality. Musculoskeletal: Post median sternotomy. There are no acute or suspicious osseous abnormalities. IMPRESSION: 1. Multifocal ground-glass, consolidative and nodular opacities throughout both lungs. Findings most consistent with multifocal pneumonia, a degree of concurrent pulmonary edema is also considered. Improved left lung base aeration from prior radiograph without dense consolidation or lung abscess. Consider follow-up CT in 3-6 months to ensure resolution of nodular opacities and exclude neoplastic etiology. 2. Moderate emphysema. Emphysema (ICD10-J43.9). Multifocal bronchiectasis. 3. Aortic atherosclerosis.   Post CABG. Electronically Signed   By: Rubye Oaks M.D.   On: 06/29/2017 04:21   Dg Chest Portable 1 View  Result Date: Jun 30, 2017 CLINICAL DATA:  OG tube placement.  COPD EXAM: PORTABLE CHEST 1 VIEW COMPARISON:  Jun 30, 2017 FINDINGS: Endotracheal tube unchanged. Introduction of nasogastric tube which extends the stomach. Dense airspace disease on the LEFT. Diffuse airspace disease on the RIGHT. IMPRESSION: 1. Introduction of NG tube which extends into the stomach. 2. Endotracheal tube in good position. 3. Dense bilateral airspace disease. Electronically Signed   By: Genevive Bi M.D.   On: 06-30-17 19:28   Dg Chest Portable 1 View  Result Date: 2017/06/30 CLINICAL DATA:   67 year old male who presented and respiratory distress. Intubated. EXAM: PORTABLE CHEST 1 VIEW COMPARISON:  06/23/2017 and earlier. FINDINGS: Portable AP supine view at 1850 hrs. Endotracheal tube tip in good position between the level the clavicles and carina. If an enteric tube is in place, it does not extend below the T1 thoracic inlet level. Stable lung volumes compared to 06/23/2017 but new diffuse bilateral pulmonary interstitial opacity. Probable left lower lobe and perihilar consolidation. Additional patchy/confluent opacity in the inferior aspect of the right upper lobe, and the right lower lobe. No pleural effusion is evident. Grossly stable mediastinal contours. Prior CABG. No pneumothorax. IMPRESSION: 1. Endotracheal tube tip in good position. If an enteric tube is in place it does not extend below the T1/thoracic inlet level. 2. Multi lobar consolidation in the left lung with superimposed acute bilateral pulmonary interstitial opacity, and patchy opacity in the right lung. Favor acute multifocal pneumonia. Consider superimposed pulmonary edema. No pleural effusion is evident. Electronically Signed   By: Odessa Fleming M.D.   On: Jun 30, 2017 19:14   STUDIES:   CULTURES: BCx2 2/13 > Urine 2/13 > RVP 2/14 > Tracheal aspirate 2/14 >  ANTIBIOTICS: Zosyn 2/13 > Vancomycin 2/13 >  SIGNIFICANT EVENTS:   LINES/TUBES: ETT 2/13 > Fem CVL  2/13 >  DISCUSSION: 67 year old male with COPD recently admitted for exacerbation presented again 2/13 with profound hypoxia. PResumed ARDS. Intubated. Transferred to [facility] for ICU care. Deeply sedated and neuromuscular blockade.   ASSESSMENT / PLAN:  PULMONARY A: ARDS COPD on home O2  P:   Continue ARDS protocol ABG now and adjust vent for ABG Deep sedation to facilitate vent synchrony Continue nimbex for today VAP bundle Chest CT I reviewed myself, no abscess noted Maintain as dry as able  CARDIOVASCULAR A:  Shock: presumably septic shock,  however, could be related to high PEEP and sedation needs.  Atrial fibrillation with RVR now sinus P:  Telemetry monitoring Phenylephrine to keep MAP > and SBP > Decrease levophed dose and use neo more KVO IVF Echo pending Amiodarone infusion  RENAL A:   Elevated creatinine. Seems to be improved from when he was discharged 2/10. No history CKD. Hyponatremia Hypokalemia  P:   Follow BMP Replace electrolytes as indicated BUN:Cr ratio not consistent with dehydration, will KVO IVF  GASTROINTESTINAL A:   No acute issues P:   NPO PPI  HEMATOLOGIC A:   No acute issues  P:  Sub q heparin  INFECTIOUS A:   PNA vs respiratory infection.   P:   Broad ABX RVP negative, d/c isolation Tracheal aspirate F/U on cultures  ENDOCRINE A:   No acute issues  P:   CBG monitoring ISS  NEUROLOGIC A:   Acute metabolic encephalopathy P:   RASS goal: -4 to -5 Fent versed Nimbex BIS and TO4  FAMILY  - Updates: Sister, daughter and girlfriend updated at length bedside, full code.  - Inter-disciplinary family meet or Palliative Care meeting due by:  2/20  The patient is critically ill with multiple organ systems failure and requires high complexity decision making for assessment and support, frequent evaluation and titration of therapies, application of advanced monitoring technologies and extensive interpretation of multiple databases.   Critical Care Time devoted to patient care services described in this note is  45  Minutes. This time reflects time of care of this signee Dr Koren Bound. This critical care time does not reflect procedure time, or teaching time or supervisory time of PA/NP/Med student/Med Resident etc but could involve care discussion time.  Alyson Reedy, M.D. [facility] Pulmonary/Critical Care Medicine. Pager: [phone]. After hours pager: [phone].  06/29/2017 11:08 AM

## 2017-06-29 NOTE — Procedures (Signed)
Arterial Catheter Insertion Procedure Note Darryl George [id number] 11-12-1950  Procedure: Insertion of Arterial Catheter  Indications: Blood pressure monitoring and Frequent blood sampling  Procedure Details Consent: Unable to obtain consent because of altered level of consciousness. Time Out: Verified patient identification, verified procedure, site/side was marked, verified correct patient position, special equipment/implants available, medications/allergies/relevent history reviewed, required imaging and test results available.  Performed   Maximum sterile technique was used including antiseptics, cap, gloves, gown, hand hygiene, mask and sheet. Skin prep: Chlorhexidine; local anesthetic administered 20 gauge catheter was inserted into right radial artery using the Seldinger technique.  Evaluation Blood flow good; BP tracing good. Complications: No apparent complications.   Darryl George 06/29/2017

## 2017-06-29 NOTE — H&P (Signed)
PULMONARY / CRITICAL CARE MEDICINE   Name: Darryl George MRN: [id number] DOB: [birth year]    ADMISSION DATE:  07/03/2017 CONSULTATION DATE:  07/12/2017  REFERRING MD:  Dr. Marthann Schiller Penn  CHIEF COMPLAINT:  hypoxia  HISTORY OF PRESENT ILLNESS:   67 year old male with past medical history as below, which is significant for COPD, hypertension, stroke, and tobacco abuse.  He was recently admitted for COPD exacerbation in which she was discharged (2/10) on 4-6 L of supplemental oxygen, azithromycin, and a steroid taper.  He then presented to [facility] emergency department on 2/13 with complaints of severe dyspnea.  He was found to hypoxemic with oxygen saturations in the 50s on a nonrebreather.  He was emergently intubated.  Also developed atrial fibrillation with rapid ventricular rate and was started on amiodarone.  Chest x-ray demonstrated severe diffuse infiltrates.  He immediately required high FiO2 and high PEEP in order to maintain adequate saturations.  He was started on broad-spectrum antibiotics and deeply sedated for vent synchrony.  He required levophed for blood pressure support.  He was transferred to [facility] for higher level ICU care in the setting of presumed ARDS.   PAST MEDICAL HISTORY :  He  has a past medical history of Back pain, COPD (chronic obstructive pulmonary disease) (HCC), Hypertension, Stroke (HCC), and Tobacco abuse.  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  No Known Allergies  No current facility-administered medications on file prior to encounter.    Current Outpatient Medications on File Prior to Encounter  Medication Sig  . albuterol (PROVENTIL HFA;VENTOLIN HFA) 108 (90 Base) MCG/ACT inhaler Inhale 2 puffs into the lungs every 6 (six) hours as needed for wheezing or  shortness of breath.  Marland Kitchen atorvastatin (LIPITOR) 80 MG tablet Take 1 tablet (80 mg total) by mouth daily at 6 PM. (Patient taking differently: Take 80 mg by mouth at bedtime. )  . azithromycin (ZITHROMAX) 250 MG tablet Take one tablet daily for 4 days  . Melatonin 3 MG TABS Take 6 mg by mouth at bedtime.  . predniSONE (DELTASONE) 10 MG tablet Take 1 tablet (10 mg total) by mouth daily with breakfast. Take 6 tablets today and then decrease by 1 tablet daily until none are left.    FAMILY HISTORY:  His indicated that his mother is deceased. He indicated that his father is deceased. He indicated that the status of his sister is unknown. He indicated that the status of his other is unknown.   SOCIAL HISTORY: He  reports that he has quit smoking. His smoking use included cigarettes. He smoked 1.00 pack per day. he has never used smokeless tobacco. He reports that he does not drink alcohol or use drugs.  REVIEW OF SYSTEMS:   unable  SUBJECTIVE:  unable  VITAL SIGNS: BP (!) 86/67   Pulse 70   Temp 98.1 F (36.7 C)   Resp 10   Ht 6\' 1"  (1.854 m)   Wt 80.7 kg (178 lb)   SpO2 94%   BMI 23.48 kg/m   HEMODYNAMICS:    VENTILATOR SETTINGS: Vent Mode: PRVC FiO2 (%):  [100 %] 100 % Set Rate:  [18 bmp-30 bmp] 30 bmp Vt Set:  [400 mL-640 mL] 400 mL PEEP:  [8 cmH20-16 cmH20] 16 cmH20  INTAKE / OUTPUT: No intake/output data recorded.  PHYSICAL EXAMINATION: General:  Elderly male  in NAD Neuro:  sedated HEENT:  [place]/AT, PERRL, no JVD Cardiovascular:  RRR, no MRG Lungs:  Clear bilateral breath sounds Abdomen:  Soft, non-distened Musculoskeletal:  No acute deformity or ROM limitation Skin:  Grossly intact  LABS:  BMET Recent Labs  Lab 06/24/17 0636 06/25/17 0630 06/25/2017 1912 06/23/2017 1915  NA 131* 135 137 130*  K 3.8 3.8 3.4* 3.2*  CL 108 108 105 99*  CO2 13* 15*  --  16*  BUN 41* 32* 20 20  CREATININE 1.72* 1.44* 1.20 1.35*  GLUCOSE 208* 145* 129* 134*     Electrolytes Recent Labs  Lab 06/24/17 0636 06/25/17 0630 06/22/2017 1915  CALCIUM 7.5* 7.8* 7.7*    CBC Recent Labs  Lab 06/23/17 1322 06/24/17 0636 07/13/2017 1912 06/18/2017 1915  WBC 4.5 3.4*  --  17.3*  HGB 16.3 14.2 12.6* 12.9*  HCT 46.5 41.2 37.0* 38.1*  PLT 163 143*  --  153    Coag's No results for input(s): APTT, INR in the last 168 hours.  Sepsis Markers Recent Labs  Lab 06/23/17 1754 06/27/2017 2146  LATICACIDVEN 0.71 0.97    ABG Recent Labs  Lab 06/29/2017 1930 06/16/2017 2215  PHART 7.230* 7.456*  PCO2ART 47.3 40.7  PO2ART 63.1* 48.9*    Liver Enzymes Recent Labs  Lab 06/23/17 1322 07/01/2017 1915  AST 45* 55*  ALT 28 44  ALKPHOS 97 149*  BILITOT 0.9 1.4*  ALBUMIN 3.5 2.7*    Cardiac Enzymes Recent Labs  Lab 06/23/17 1322 06/23/17 1807 06/26/2017 1915  TROPONINI 0.03* 0.03* 0.03*    Glucose No results for input(s): GLUCAP in the last 168 hours.  Imaging Dg Chest Portable 1 View  Result Date: 06/20/2017 CLINICAL DATA:  OG tube placement.  COPD EXAM: PORTABLE CHEST 1 VIEW COMPARISON:  06/17/2017 FINDINGS: Endotracheal tube unchanged. Introduction of nasogastric tube which extends the stomach. Dense airspace disease on the LEFT. Diffuse airspace disease on the RIGHT. IMPRESSION: 1. Introduction of NG tube which extends into the stomach. 2. Endotracheal tube in good position. 3. Dense bilateral airspace disease. Electronically Signed   By: Genevive Bi M.D.   On: 06/30/2017 19:28   Dg Chest Portable 1 View  Result Date: 07/03/2017 CLINICAL DATA:  67 year old male who presented and respiratory distress. Intubated. EXAM: PORTABLE CHEST 1 VIEW COMPARISON:  06/23/2017 and earlier. FINDINGS: Portable AP supine view at 1850 hrs. Endotracheal tube tip in good position between the level the clavicles and carina. If an enteric tube is in place, it does not extend below the T1 thoracic inlet level. Stable lung volumes compared to 06/23/2017 but new  diffuse bilateral pulmonary interstitial opacity. Probable left lower lobe and perihilar consolidation. Additional patchy/confluent opacity in the inferior aspect of the right upper lobe, and the right lower lobe. No pleural effusion is evident. Grossly stable mediastinal contours. Prior CABG. No pneumothorax. IMPRESSION: 1. Endotracheal tube tip in good position. If an enteric tube is in place it does not extend below the T1/thoracic inlet level. 2. Multi lobar consolidation in the left lung with superimposed acute bilateral pulmonary interstitial opacity, and patchy opacity in the right lung. Favor acute multifocal pneumonia. Consider superimposed pulmonary edema. No pleural effusion is evident. Electronically Signed   By: Odessa Fleming M.D.   On: 06/20/2017 19:14     STUDIES:    CULTURES: BCx2 2/13 > Urine 2/13 > RVP 2/14 > Tracheal aspirate 2/14 >  ANTIBIOTICS: Zosyn 2/13 > Vancomycin 2/13 >  SIGNIFICANT EVENTS:   LINES/TUBES:  ETT 2/13 > Fem CVL 2/13 >  DISCUSSION: 67 year old male with COPD recently admitted for exacerbation presented again 2/13 with profound hypoxia. PResumed ARDS. Intubated. Transferred to [facility] for ICU care. Deeply sedated and neuromuscular blockade.   ASSESSMENT / PLAN:  PULMONARY A: ARDS COPD on home O2  P:   Full vent support per ARDS protocol 6cc/kg Deep sedation to facilitate vent synchrony May need neuromuscular blockade.  VAP bundle Art line insert for frequent ABG CT chest to rule out abscess on L. Densely consolidated on CXR  CARDIOVASCULAR A:  Shock: presumably septic shock, however, could be related to high PEEP and sedation needs.  Atrial fibrillation with RVR now sinus P:  Telemetry monitoring Phenylephrine to keep MAP > and SBP > Add norepi if needed, although caution given AF KVO IVF Echo Amiodarone infusion  RENAL A:   Elevated creatinine. Seems to be improved from when he was discharged 2/10. No history  CKD. Hyponatremia Hypokalemia  P:   Follow BMP Supp K  GASTROINTESTINAL A:   No acute issues P:   NPO PPI  HEMATOLOGIC A:   No acute issues  P:  Sub q heparin  INFECTIOUS A:   PNA vs respiratory infection.   P:   Broad ABX RVP Tracheal aspirate  ENDOCRINE A:   No acute issues  P:   CBG monitoring  NEUROLOGIC A:   Acute metabolic encephalopathy P:   RASS goal: -4 to -5 Fent versed Nimbex BIS and TO4   FAMILY  - Updates: Significant other katie updated  - Inter-disciplinary family meet or Palliative Care meeting due by:  2/20   Joneen Roach, AGACNP-BC [facility]/Critical Care Pager [phone] or [phone]  06/29/2017 1:22 AM

## 2017-06-29 NOTE — Progress Notes (Signed)
Initial Nutrition Assessment  DOCUMENTATION CODES:   Not applicable  INTERVENTION:   -Initiate TF via OGT with Vital AF 1.2 at goal rate of 70 ml/h (1680 ml per day)  to provide 2016 kcals, 126 gm protein, 1362 ml free water daily.  NUTRITION DIAGNOSIS:   Inadequate oral intake related to inability to eat as evidenced by NPO status.  GOAL:   Patient will meet greater than or equal to 90% of their needs  MONITOR:   Vent status, Labs, Weight trends, TF tolerance, Skin, I & O's  REASON FOR ASSESSMENT:   Ventilator, Consult Enteral/tube feeding initiation and management  ASSESSMENT:   67 year old male with COPD recently admitted for exacerbation presented again 2/13 with profound hypoxia. PResumed ARDS. Intubated. Transferred to [facility] for ICU care. Deeply sedated and neuromuscular blockade.   Case discussed with RN prior to visit, who confirmed that pt is on ARDS protocol and with plans to start TF today.   Patient is currently intubated on ventilator support. OGT in place.  MV: 14.0 L/min Temp (24hrs), Avg:98.6 F (37 C), Min:97.7 F (36.5 C), Max:100.6 F (38.1 C)  Spoke with pt family members (daughter, girlfriend, and sister) at bedside. They report pt has a hearty appetite at baseline and loves "country cooking". However, pt with diminished appetite for 1 week PTA (bites and sips) due to respiratory distress and recent prior hospitalization last week at [facility]. They suspect pt has lost weight, but unsure how much. Pt sister denies any changes in appearance.   Reviewed wt hx; noted UBW around 180#. No significant wt changes noted per wt hx.   Discussed with pt family how pt will receive nutrition while on ventilator.   Labs reviewed: Phos: 6.1.  NUTRITION - FOCUSED PHYSICAL EXAM:    Most Recent Value  Orbital Region  No depletion  Upper Arm Region  No depletion  Thoracic and Lumbar Region  No depletion  Buccal Region  No depletion  Temple Region  No depletion   Clavicle Bone Region  No depletion  Clavicle and Acromion Bone Region  No depletion  Scapular Bone Region  No depletion  Dorsal Hand  No depletion  Patellar Region  Mild depletion  Anterior Thigh Region  No depletion  Posterior Calf Region  Mild depletion  Edema (RD Assessment)  None  Hair  Reviewed  Eyes  Reviewed  Mouth  Reviewed  Skin  Reviewed  Nails  Reviewed       Diet Order:  Diet NPO time specified  EDUCATION NEEDS:   Education needs have been addressed  Skin:  Skin Assessment: Reviewed RN Assessment  Last BM:  PTA  Height:   Ht Readings from Last 1 Encounters:  06/29/17 6\' 1"  (1.854 m)    Weight:   Wt Readings from Last 1 Encounters:  06/29/17 182 lb 12.2 oz (82.9 kg)    Ideal Body Weight:  83.6 kg  BMI:  Body mass index is 24.11 kg/m.  Estimated Nutritional Needs:   Kcal:  2120  Protein:  115-130 grams  Fluid:  > 2 L    Airyonna Franklyn A. Mayford Knife, RD, LDN, CDE Pager: [phone] After hours Pager: [phone]

## 2017-06-29 NOTE — Progress Notes (Signed)
Pharmacy Antibiotic Note  Darryl George is a 67 y.o. male admitted on 06/22/2017 with pneumonia.  Pharmacy has been consulted for Vancomycin/Zosyn dosing. WBC elevated. Scr elevated at 1.35. CXR with bilateral airspace disease.   Plan: -Vancomycin 750 mg IV q12h, decrease dose if renal function worsens -Zosyn 3.375G IV q8h to be infused over 4 hours -Trend WBC, temp, renal function  -F/U infectious work-up -Drug levels as indicated   Height: 6\' 1"  (185.4 cm) Weight: 178 lb (80.7 kg) IBW/kg (Calculated) : 79.9  Temp (24hrs), Avg:98.7 F (37.1 C), Min:97.7 F (36.5 C), Max:100.6 F (38.1 C)  Recent Labs  Lab 06/23/17 1322 06/23/17 1754 06/24/17 0636 06/25/17 0630 07/01/2017 1912 06/19/2017 1915 07/09/2017 2146  WBC 4.5  --  3.4*  --   --  17.3*  --   CREATININE 2.70*  --  1.72* 1.44* 1.20 1.35*  --   LATICACIDVEN  --  0.71  --   --   --   --  0.97    Estimated Creatinine Clearance: 59.2 mL/min (A) (by C-G formula based on SCr of 1.35 mg/dL (H)).    No Known Allergies   [name] 06/29/2017 3:59 AM

## 2017-06-29 NOTE — Plan of Care (Signed)
  Progressing Education: Knowledge of General Education information will improve 06/29/2017 1051 - Progressing by Marcelino Scot, RN Skin Integrity: Risk for impaired skin integrity will decrease 06/29/2017 1051 - Progressing by Marcelino Scot, RN Respiratory: Ability to maintain a clear airway and adequate ventilation will improve 06/29/2017 1051 - Progressing by Marcelino Scot, RN Role Relationship: Method of communication will improve 06/29/2017 1051 - Progressing by Marcelino Scot, RN

## 2017-06-30 ENCOUNTER — Inpatient Hospital Stay (HOSPITAL_COMMUNITY): Payer: Medicare HMO

## 2017-06-30 DIAGNOSIS — I361 Nonrheumatic tricuspid (valve) insufficiency: Secondary | ICD-10-CM

## 2017-06-30 LAB — GLUCOSE, CAPILLARY
GLUCOSE-CAPILLARY: 123 mg/dL — AB (ref 65–99)
GLUCOSE-CAPILLARY: 138 mg/dL — AB (ref 65–99)
GLUCOSE-CAPILLARY: 138 mg/dL — AB (ref 65–99)
GLUCOSE-CAPILLARY: 149 mg/dL — AB (ref 65–99)
GLUCOSE-CAPILLARY: 159 mg/dL — AB (ref 65–99)
GLUCOSE-CAPILLARY: 162 mg/dL — AB (ref 65–99)
GLUCOSE-CAPILLARY: 173 mg/dL — AB (ref 65–99)
GLUCOSE-CAPILLARY: 176 mg/dL — AB (ref 65–99)
Glucose-Capillary: 126 mg/dL — ABNORMAL HIGH (ref 65–99)
Glucose-Capillary: 134 mg/dL — ABNORMAL HIGH (ref 65–99)
Glucose-Capillary: 151 mg/dL — ABNORMAL HIGH (ref 65–99)
Glucose-Capillary: 153 mg/dL — ABNORMAL HIGH (ref 65–99)
Glucose-Capillary: 153 mg/dL — ABNORMAL HIGH (ref 65–99)
Glucose-Capillary: 172 mg/dL — ABNORMAL HIGH (ref 65–99)
Glucose-Capillary: 177 mg/dL — ABNORMAL HIGH (ref 65–99)
Glucose-Capillary: 183 mg/dL — ABNORMAL HIGH (ref 65–99)
Glucose-Capillary: 184 mg/dL — ABNORMAL HIGH (ref 65–99)
Glucose-Capillary: 71 mg/dL (ref 65–99)

## 2017-06-30 LAB — BLOOD GAS, ARTERIAL
Acid-Base Excess: 4.1 mmol/L — ABNORMAL HIGH (ref 0.0–2.0)
Bicarbonate: 28.7 mmol/L — ABNORMAL HIGH (ref 20.0–28.0)
Drawn by: 41422
FIO2: 60
LHR: 30 {breaths}/min
O2 Saturation: 97.7 %
PEEP: 10 cmH2O
PO2 ART: 108 mmHg (ref 83.0–108.0)
Patient temperature: 98.6
VT: 480 mL
pCO2 arterial: 48.6 mmHg — ABNORMAL HIGH (ref 32.0–48.0)
pH, Arterial: 7.39 (ref 7.350–7.450)

## 2017-06-30 LAB — BASIC METABOLIC PANEL
Anion gap: 11 (ref 5–15)
BUN: 24 mg/dL — ABNORMAL HIGH (ref 6–20)
CALCIUM: 6.5 mg/dL — AB (ref 8.9–10.3)
CO2: 26 mmol/L (ref 22–32)
CREATININE: 1.97 mg/dL — AB (ref 0.61–1.24)
Chloride: 106 mmol/L (ref 101–111)
GFR calc Af Amer: 38 mL/min — ABNORMAL LOW (ref >60)
GFR calc non Af Amer: 33 mL/min — ABNORMAL LOW (ref >60)
Glucose, Bld: 189 mg/dL — ABNORMAL HIGH (ref 65–99)
Potassium: 3.4 mmol/L — ABNORMAL LOW (ref 3.5–5.1)
SODIUM: 143 mmol/L (ref 135–145)

## 2017-06-30 LAB — CBC
HCT: 32.1 % — ABNORMAL LOW (ref 39.0–52.0)
Hemoglobin: 10.6 g/dL — ABNORMAL LOW (ref 13.0–17.0)
MCH: 28 pg (ref 26.0–34.0)
MCHC: 33 g/dL (ref 30.0–36.0)
MCV: 84.9 fL (ref 78.0–100.0)
Platelets: 152 10*3/uL (ref 150–400)
RBC: 3.78 MIL/uL — ABNORMAL LOW (ref 4.22–5.81)
RDW: 16.2 % — AB (ref 11.5–15.5)
WBC: 19.6 10*3/uL — ABNORMAL HIGH (ref 4.0–10.5)

## 2017-06-30 LAB — PHOSPHORUS: Phosphorus: 3.2 mg/dL (ref 2.5–4.6)

## 2017-06-30 LAB — URINE CULTURE: CULTURE: NO GROWTH

## 2017-06-30 LAB — MAGNESIUM: MAGNESIUM: 2.2 mg/dL (ref 1.7–2.4)

## 2017-06-30 LAB — ECHOCARDIOGRAM COMPLETE
HEIGHTINCHES: 73 in
Weight: 3079.39 oz

## 2017-06-30 LAB — VANCOMYCIN, TROUGH: Vancomycin Tr: 17 ug/mL (ref 15–20)

## 2017-06-30 MED ORDER — INSULIN ASPART 100 UNIT/ML ~~LOC~~ SOLN
2.0000 [IU] | SUBCUTANEOUS | Status: DC
  Administered 2017-06-30: 2 [IU] via SUBCUTANEOUS
  Administered 2017-06-30: 4 [IU] via SUBCUTANEOUS
  Administered 2017-07-01 – 2017-07-06 (×9): 2 [IU] via SUBCUTANEOUS
  Administered 2017-07-06: 4 [IU] via SUBCUTANEOUS
  Administered 2017-07-07 – 2017-07-08 (×6): 2 [IU] via SUBCUTANEOUS
  Administered 2017-07-08: 4 [IU] via SUBCUTANEOUS
  Administered 2017-07-09 – 2017-07-10 (×5): 2 [IU] via SUBCUTANEOUS

## 2017-06-30 MED ORDER — POTASSIUM CHLORIDE 20 MEQ/15ML (10%) PO SOLN
40.0000 meq | Freq: Once | ORAL | Status: AC
  Administered 2017-06-30: 40 meq
  Filled 2017-06-30: qty 30

## 2017-06-30 MED ORDER — PANTOPRAZOLE SODIUM 40 MG PO PACK
40.0000 mg | PACK | Freq: Every day | ORAL | Status: DC
  Administered 2017-07-01 – 2017-07-10 (×10): 40 mg
  Filled 2017-06-30 (×10): qty 20

## 2017-06-30 MED ORDER — INSULIN DETEMIR 100 UNIT/ML ~~LOC~~ SOLN
5.0000 [IU] | Freq: Two times a day (BID) | SUBCUTANEOUS | Status: DC
  Administered 2017-06-30 – 2017-07-10 (×16): 5 [IU] via SUBCUTANEOUS
  Filled 2017-06-30 (×21): qty 0.05

## 2017-06-30 MED ORDER — SODIUM CHLORIDE 0.9 % IV SOLN
1.0000 g | Freq: Once | INTRAVENOUS | Status: AC
  Administered 2017-06-30: 1 g via INTRAVENOUS
  Filled 2017-06-30: qty 10

## 2017-06-30 MED ORDER — PIPERACILLIN-TAZOBACTAM 3.375 G IVPB
3.3750 g | Freq: Three times a day (TID) | INTRAVENOUS | Status: DC
  Administered 2017-06-30 – 2017-07-10 (×31): 3.375 g via INTRAVENOUS
  Filled 2017-06-30 (×32): qty 50

## 2017-06-30 NOTE — Plan of Care (Signed)
  Progressing Education: Knowledge of General Education information will improve 06/30/2017 0755 - Progressing by Arlington Calix, RN Health Behavior/Discharge Planning: Ability to manage health-related needs will improve 06/30/2017 0755 - Progressing by Arlington Calix, RN Clinical Measurements: Ability to maintain clinical measurements within normal limits will improve 06/30/2017 0755 - Progressing by Arlington Calix, RN Will remain free from infection 06/30/2017 0755 - Progressing by Arlington Calix, RN Diagnostic test results will improve 06/30/2017 0755 - Progressing by Arlington Calix, RN Respiratory complications will improve 06/30/2017 0755 - Progressing by Arlington Calix, RN Cardiovascular complication will be avoided 06/30/2017 0755 - Progressing by Arlington Calix, RN Activity: Risk for activity intolerance will decrease 06/30/2017 0755 - Progressing by Arlington Calix, RN Nutrition: Adequate nutrition will be maintained 06/30/2017 0755 - Progressing by Arlington Calix, RN Coping: Level of anxiety will decrease 06/30/2017 0755 - Progressing by Arlington Calix, RN Elimination: Will not experience complications related to bowel motility 06/30/2017 0755 - Progressing by Arlington Calix, RN Will not experience complications related to urinary retention 06/30/2017 0755 - Progressing by Arlington Calix, RN Pain Managment: General experience of comfort will improve 06/30/2017 0755 - Progressing by Arlington Calix, RN Safety: Ability to remain free from injury will improve 06/30/2017 0755 - Progressing by Arlington Calix, RN Skin Integrity: Risk for impaired skin integrity will decrease 06/30/2017 0755 - Progressing by Arlington Calix, RN Activity: Ability to tolerate increased activity will improve 06/30/2017 0755 - Progressing by Arlington Calix, RN Respiratory: Ability to maintain a clear airway and adequate ventilation will improve 06/30/2017 0755 - Progressing by  Arlington Calix, RN Role Relationship: Method of communication will improve 06/30/2017 0755 - Progressing by Arlington Calix, RN

## 2017-06-30 NOTE — Progress Notes (Signed)
   06/30/17 1600  Vitals  Temp 100 F (37.8 C)  BP 99/60  MAP (mmHg) 72  Pulse Rate (!) 55  ECG Heart Rate (!) 55  Resp (!) 30  Oxygen Therapy  SpO2 (!) 87 %  Art Line  Arterial Line BP 122/48  Arterial Line MAP (mmHg) 66 mmHg  Pt's SpO2 has continued to drop over the last few hours. Pt on 60% fio2 on the ventilator. 1 O2 breath of 100% oxygen was given.  RT notified to come adjust vent settings. Will continue to monitor.

## 2017-06-30 NOTE — Plan of Care (Signed)
  Progressing Activity: Risk for activity intolerance will decrease 06/30/2017 1244 - Progressing by Marcelino Scot, RN Nutrition: Adequate nutrition will be maintained 06/30/2017 1244 - Progressing by Marcelino Scot, RN Skin Integrity: Risk for impaired skin integrity will decrease 06/30/2017 1244 - Progressing by Marcelino Scot, RN Respiratory: Ability to maintain a clear airway and adequate ventilation will improve 06/30/2017 1244 - Progressing by Marcelino Scot, RN

## 2017-06-30 NOTE — Progress Notes (Signed)
  Echocardiogram 2D Echocardiogram has been performed.  Darryl George 06/30/2017, 8:52 AM

## 2017-06-30 NOTE — Progress Notes (Signed)
PULMONARY / CRITICAL CARE MEDICINE   Name: Darryl George MRN: [id number] DOB: 14-Sep-1950    ADMISSION DATE:  07/07/17 CONSULTATION DATE:  07-07-17  REFERRING MD:  Dr. Marthann Schiller Penn  CHIEF COMPLAINT:  hypoxia  HISTORY OF PRESENT ILLNESS:   67 year old male with past medical history as below, which is significant for COPD, hypertension, stroke, and tobacco abuse.  He was recently admitted for COPD exacerbation in which she was discharged (2/10) on 4-6 L of supplemental oxygen, azithromycin, and a steroid taper.  He then presented to [facility] emergency department on 2/13 with complaints of severe dyspnea.  He was found to hypoxemic with oxygen saturations in the 50s on a nonrebreather.  He was emergently intubated.  Also developed atrial fibrillation with rapid ventricular rate and was started on amiodarone.  Chest x-ray demonstrated severe diffuse infiltrates.  He immediately required high FiO2 and high PEEP in order to maintain adequate saturations.  He was started on broad-spectrum antibiotics and deeply sedated for vent synchrony.  He required levophed for blood pressure support.  He was transferred to [facility] for higher level ICU care in the setting of presumed ARDS.   SUBJECTIVE:  Currently on neuromuscular blockade.  We will try off neuromuscular blockade to see if he continues to tolerate ventilatory support.  VITAL SIGNS: BP (!) 98/57   Pulse (!) 59   Temp 99.3 F (37.4 C)   Resp (!) 30   Ht 6\' 1"  (1.854 m)   Wt 87.3 kg (192 lb 7.4 oz)   SpO2 92%   BMI 25.39 kg/m   HEMODYNAMICS:    VENTILATOR SETTINGS: Vent Mode: PRVC FiO2 (%):  [50 %-70 %] 50 % Set Rate:  [30 bmp] 30 bmp Vt Set:  [480 mL] 480 mL PEEP:  [10 cmH20-14 cmH20] 10 cmH20 Plateau Pressure:  [19 cmH20-23 cmH20] 20 cmH20  INTAKE / OUTPUT: I/O last 3 completed shifts: In: 22 [I.V.:4888.7; NG/GT:469.3; IV Piggyback:3600] Out: 1535 [Urine:1535]  PHYSICAL EXAMINATION: General: Elderly male who  appears older than stated age, currently neuromuscular blockade sedated with fentanyl and Versed. HEENT: Endotracheal tube to ventilator, orogastric tube and tube feeds PSY: Neuro:  CV: s1s2 rrr, no m/r/g PULM: Coarse rhonchi right greater than left WU:JWJX, nontender, faint bowel sounds left lower quadrant Extremities: warm/dry, 1+ edema  Skin: no rashes or lesions   LABS:  BMET Recent Labs  Lab 07-07-17 1915 06/29/17 0500 06/30/17 0325  NA 130* 140 143  K 3.2* 4.6 3.4*  CL 99* 107 106  CO2 16* 22 26  BUN 20 22* 24*  CREATININE 1.35* 1.78* 1.97*  GLUCOSE 134* 221* 189*   Electrolytes Recent Labs  Lab Jul 07, 2017 1915 06/29/17 0500 06/30/17 0325  CALCIUM 7.7* 6.8* 6.5*  MG  --  2.2 2.2  PHOS  --  6.1* 3.2   CBC Recent Labs  Lab 07/07/17 1915 06/29/17 0500 06/30/17 0325  WBC 17.3* 20.7* 19.6*  HGB 12.9* 12.2* 10.6*  HCT 38.1* 36.1* 32.1*  PLT 153 170 152   Coag's No results for input(s): APTT, INR in the last 168 hours.  Sepsis Markers Recent Labs  Lab 06/23/17 1754 July 07, 2017 2146  LATICACIDVEN 0.71 0.97   ABG Recent Labs  Lab 06/29/17 1105 06/29/17 1713 06/30/17 0324  PHART 7.288* 7.328* 7.390  PCO2ART 62.0* 55.5* 48.6*  PO2ART 118.0* 96.0 108   Liver Enzymes Recent Labs  Lab 06/23/17 1322 07/07/2017 1915  AST 45* 55*  ALT 28 44  ALKPHOS 97 149*  BILITOT  0.9 1.4*  ALBUMIN 3.5 2.7*   Cardiac Enzymes Recent Labs  Lab 06/23/17 1322 06/23/17 1807 2017-07-08 1915  TROPONINI 0.03* 0.03* 0.03*   Glucose Recent Labs  Lab 06/30/17 0425 06/30/17 0436 06/30/17 0540 06/30/17 0647 06/30/17 0741 06/30/17 0846  GLUCAP 71 162* 184* 173* 176* 153*    Imaging Dg Chest Port 1 View  Result Date: 06/30/2017 CLINICAL DATA:  Endotracheal tube EXAM: PORTABLE CHEST 1 VIEW COMPARISON:  2017/07/08 FINDINGS: Endotracheal tube in good position. NG tube enters the stomach with the tip not visualized Diffuse bilateral airspace disease appears unchanged  on the right and mildly improved on the left. No effusion. Underlying chronic lung disease. IMPRESSION: Endotracheal tube in good position Diffuse bilateral airspace disease with some improvement on the left. Probable pneumonia. Electronically Signed   By: Marlan Palau M.D.   On: 06/30/2017 08:00   STUDIES:  06/30/2017 2D echo results pending>> CULTURES: BCx2 2/13 > Urine 2/13 > RVP 2/14 > negative Tracheal aspirate 2/14 >  ANTIBIOTICS: Zosyn 2/13 > Vancomycin 2/13 >  SIGNIFICANT EVENTS:   LINES/TUBES: ETT 2/13 > Fem CVL 2/13 > 06/29/2017 right radial A-line>>  DISCUSSION: 67 year old male with COPD recently admitted for exacerbation presented again 2/13 with profound hypoxia. PResumed ARDS. Intubated. Transferred to [facility] for ICU care. Deeply sedated and neuromuscular blockade.   ASSESSMENT / PLAN:  PULMONARY A: ARDS COPD on home O2  Intake/Output Summary (Last 24 hours) at 06/30/2017 0954 Last data filed at 06/30/2017 0800 Gross per 24 hour  Intake 3491.81 ml  Output 1235 ml  Net 2256.81 ml   P:   Continue ARDS protocol Deep sedation to facilitate vent synchrony 06/30/2017 we will try off neuromuscular blockade if he becomes asynchronous with the vent will resume neuromuscular blockade. VAP bundle Chest CT on 06/29/2017 reviewed by S Minor 06/30/2017 chest x-ray reviewed which demonstrated bilateral airspace disease Maintain as dry as able  CARDIOVASCULAR A:  Shock: presumably septic shock, however, could be related to high PEEP and sedation needs.  Atrial fibrillation with RVR now sinus P:  Telemetry monitoring Phenylephrine to keep MAP > and SBP > Decrease levophed dose and use neo more.  06/30/2017 noted to be off Neo-Synephrine and weaning Levophed.  Since he is in a sinus bradycardia we will not change the new at this time.  If he becomes tachycardic then we can transition to Neo-Synephrine but he is weaning from the Levophed at this time. KVO  IVF Echo pending as of 06/30/2017 Amiodarone infusion stopped on 06/30/2017 for sinus bradycardia  RENAL Lab Results  Component Value Date   CREATININE 1.97 (H) 06/30/2017   CREATININE 1.78 (H) 06/29/2017   CREATININE 1.35 (H) 07/08/17   CREATININE 1.52 (H) 02/11/2016   Recent Labs  Lab July 08, 2017 1915 06/29/17 0500 06/30/17 0325  K 3.2* 4.6 3.4*   Recent Labs  Lab 2017-07-08 1915 06/29/17 0500 06/30/17 0325  NA 130* 140 143    A:   Elevated creatinine. Seems to be improved from when he was discharged 2/10. No history CKD. Hyponatremia resolved Hypokalemia replete as needed Hypocalcemia replete as needed P:   Follow BMP Replace electrolytes as indicated BUN:Cr ratio not consistent with dehydration, will KVO IVF  GASTROINTESTINAL A:   No acute issues P:   NPO PPI Currently on tube feedings  HEMATOLOGIC Recent Labs    06/29/17 0500 06/30/17 0325  HGB 12.2* 10.6*    A:   No acute issues  P:  Sub q  heparin  INFECTIOUS A:   PNA vs respiratory infection.  Respiratory virus panel is negative  P:   Broad ABX on 06/29/2017 to vancomycin and Zosyn RVP negative, d/c isolation Tracheal aspirate F/U on cultures  ENDOCRINE CBG (last 3)  Recent Labs    06/30/17 0647 06/30/17 0741 06/30/17 0846  GLUCAP 173* 176* 153*    A:   No acute issues  P:   CBG monitoring ISS  NEUROLOGIC A:   Acute metabolic encephalopathy P:   RASS goal: -4 to -5, 06/30/2017 try off neuromuscular blockade and use sedation as tolerated. Fent versed Nimbex 06/30/2017 we will try off Nimbex as he has been on it 24 hours. BIS and TO4  FAMILY  - Updates: 215 2018-02-12 no family at the bedside at this time. - Inter-disciplinary family meet or Palliative Care meeting due by:  2/20  App cct 60 min   Brett Canales Minor ACNP [facility] PCCM Pager [phone] till 1 pm If no answer page [phone] 06/30/2017, 9:47 AM

## 2017-06-30 NOTE — Progress Notes (Signed)
Sputum sample obtained and sent to lab by RT. 

## 2017-06-30 NOTE — Progress Notes (Signed)
Pt sinus brady with recent Qtc 0.50 at 0330 pt on Amio infusion at 30 mg/hr. Elink RN called to forward mssg to MD Vassie Loll.

## 2017-06-30 NOTE — Progress Notes (Signed)
Pharmacy Antibiotic Note  Darryl George is a 67 y.o. male admitted on July 15, 2017 with pneumonia.  Pharmacy has been consulted for Vancomycin/Zosyn dosing. Vancomycin trough therapeutic at 17 mcg/ml, drawn appropriately. Renal function has remained relatively stable, cultures negative so far.  Plan: -Continue vancomycin 750mg  IV q12h - F/U LOT -Zosyn 3.375G IV q8h to be infused over 4 hours -Trend WBC, temp, renal function  -F/U infectious work-up   Height: 6\' 1"  (185.4 cm) Weight: 192 lb 7.4 oz (87.3 kg) IBW/kg (Calculated) : 79.9  Temp (24hrs), Avg:98.7 F (37.1 C), Min:91.6 F (33.1 C), Max:100.2 F (37.9 C)  Recent Labs  Lab 06/24/17 0636 06/25/17 0630 Jul 15, 2017 1912 July 15, 2017 1915 2017/07/15 2146 06/29/17 0500 06/30/17 0325 06/30/17 1800  WBC 3.4*  --   --  17.3*  --  20.7* 19.6*  --   CREATININE 1.72* 1.44* 1.20 1.35*  --  1.78* 1.97*  --   LATICACIDVEN  --   --   --   --  0.97  --   --   --   VANCOTROUGH  --   --   --   --   --   --   --  17    Estimated Creatinine Clearance: 40.6 mL/min (A) (by C-G formula based on SCr of 1.97 mg/dL (H)).    No Known Allergies   Antimicrobials: 2/13 vancomycin >> 2/13 zosyn >>  Dose Adjustments: 2/15 VT = 17 - no changes  Microbiology: 2/13 blood x2- ngtd 2/13 urine: sent 2/14 MRSA PCR- neg 2/14 - resp panel - neg   Fredonia Highland, PharmD, BCPS PGY-2 Cardiology Pharmacy Resident Pager: [phone] 06/30/2017

## 2017-06-30 NOTE — Progress Notes (Signed)
eLink Physician-Brief Progress Note Patient Name: Darryl George DOB: 03/19/1951 MRN: [id number]   Date of Service  06/30/2017  HPI/Events of Note  AF-RVR - now in sinus brady with long QT  eICU Interventions  Dc amio gtt     Intervention Category Major Interventions: Arrhythmia - evaluation and management  Daron Breeding V. Wagner Tanzi 06/30/2017, 4:07 AM

## 2017-07-01 ENCOUNTER — Inpatient Hospital Stay (HOSPITAL_COMMUNITY): Payer: Medicare HMO

## 2017-07-01 LAB — BASIC METABOLIC PANEL
Anion gap: 11 (ref 5–15)
BUN: 35 mg/dL — AB (ref 6–20)
CO2: 25 mmol/L (ref 22–32)
Calcium: 7 mg/dL — ABNORMAL LOW (ref 8.9–10.3)
Chloride: 110 mmol/L (ref 101–111)
Creatinine, Ser: 2.07 mg/dL — ABNORMAL HIGH (ref 0.61–1.24)
GFR calc Af Amer: 36 mL/min — ABNORMAL LOW (ref >60)
GFR, EST NON AFRICAN AMERICAN: 31 mL/min — AB (ref >60)
Glucose, Bld: 113 mg/dL — ABNORMAL HIGH (ref 65–99)
POTASSIUM: 3.6 mmol/L (ref 3.5–5.1)
SODIUM: 146 mmol/L — AB (ref 135–145)

## 2017-07-01 LAB — POCT I-STAT 3, ART BLOOD GAS (G3+)
ACID-BASE EXCESS: 3 mmol/L — AB (ref 0.0–2.0)
Acid-Base Excess: 4 mmol/L — ABNORMAL HIGH (ref 0.0–2.0)
BICARBONATE: 28.1 mmol/L — AB (ref 20.0–28.0)
Bicarbonate: 29.9 mmol/L — ABNORMAL HIGH (ref 20.0–28.0)
O2 Saturation: 85 %
O2 Saturation: 97 %
PCO2 ART: 50.8 mmHg — AB (ref 32.0–48.0)
Patient temperature: 37.8
TCO2: 30 mmol/L (ref 22–32)
TCO2: 31 mmol/L (ref 22–32)
pCO2 arterial: 45.6 mmHg (ref 32.0–48.0)
pH, Arterial: 7.394 (ref 7.350–7.450)
pH, Arterial: 7.381 (ref 7.350–7.450)
pO2, Arterial: 48 mmHg — ABNORMAL LOW (ref 83.0–108.0)
pO2, Arterial: 97 mmHg (ref 83.0–108.0)

## 2017-07-01 LAB — GLUCOSE, CAPILLARY
GLUCOSE-CAPILLARY: 115 mg/dL — AB (ref 65–99)
GLUCOSE-CAPILLARY: 123 mg/dL — AB (ref 65–99)
GLUCOSE-CAPILLARY: 139 mg/dL — AB (ref 65–99)
GLUCOSE-CAPILLARY: 146 mg/dL — AB (ref 65–99)
Glucose-Capillary: 108 mg/dL — ABNORMAL HIGH (ref 65–99)
Glucose-Capillary: 107 mg/dL — ABNORMAL HIGH (ref 65–99)
Glucose-Capillary: 105 mg/dL — ABNORMAL HIGH (ref 65–99)
Glucose-Capillary: 105 mg/dL — ABNORMAL HIGH (ref 65–99)

## 2017-07-01 LAB — CBC WITH DIFFERENTIAL/PLATELET
BASOS PCT: 0 %
Basophils Absolute: 0 10*3/uL (ref 0.0–0.1)
EOS ABS: 0.1 10*3/uL (ref 0.0–0.7)
Eosinophils Relative: 1 %
HCT: 31.1 % — ABNORMAL LOW (ref 39.0–52.0)
Hemoglobin: 10 g/dL — ABNORMAL LOW (ref 13.0–17.0)
Lymphocytes Relative: 8 %
Lymphs Abs: 1.3 10*3/uL (ref 0.7–4.0)
MCH: 28.1 pg (ref 26.0–34.0)
MCHC: 32.2 g/dL (ref 30.0–36.0)
MCV: 87.4 fL (ref 78.0–100.0)
MONOS PCT: 5 %
Monocytes Absolute: 0.8 10*3/uL (ref 0.1–1.0)
NEUTROS PCT: 86 %
Neutro Abs: 13.8 10*3/uL — ABNORMAL HIGH (ref 1.7–7.7)
PLATELETS: 184 10*3/uL (ref 150–400)
RBC: 3.56 MIL/uL — ABNORMAL LOW (ref 4.22–5.81)
RDW: 16.8 % — ABNORMAL HIGH (ref 11.5–15.5)
WBC: 16.1 10*3/uL — ABNORMAL HIGH (ref 4.0–10.5)

## 2017-07-01 LAB — PHOSPHORUS: Phosphorus: 2.1 mg/dL — ABNORMAL LOW (ref 2.5–4.6)

## 2017-07-01 LAB — MAGNESIUM: MAGNESIUM: 2.3 mg/dL (ref 1.7–2.4)

## 2017-07-01 LAB — PROCALCITONIN: Procalcitonin: 1.89 ng/mL

## 2017-07-01 MED ORDER — FUROSEMIDE 10 MG/ML IJ SOLN
40.0000 mg | Freq: Two times a day (BID) | INTRAMUSCULAR | Status: DC
  Administered 2017-07-01 – 2017-07-02 (×3): 40 mg via INTRAVENOUS
  Filled 2017-07-01 (×3): qty 4

## 2017-07-01 NOTE — Progress Notes (Signed)
ABG obtained on patient per protocol.  Patient ventilator settings were tidal volume of 480, respiratory rate of 30, FIO2 of 70%, PEEP of 10.  Increased FIO2 to 80% and PEEP of 12 per ARDS protocol.     Ref. Range 07/01/2017 08:14  Sample type Unknown ARTERIAL  pH, Arterial Latest Ref Range: 7.350 - 7.450  7.394  pCO2 arterial Latest Ref Range: 32.0 - 48.0 mmHg 45.6  pO2, Arterial Latest Ref Range: 83.0 - 108.0 mmHg 48.0 (L)  TCO2 Latest Ref Range: 22 - 32 mmol/L 30  Acid-Base Excess Latest Ref Range: 0.0 - 2.0 mmol/L 3.0 (H)  Bicarbonate Latest Ref Range: 20.0 - 28.0 mmol/L 28.1 (H)  O2 Saturation Latest Units: % 85.0  Patient temperature Unknown 97.1 F  Collection site Unknown ARTERIAL LINE

## 2017-07-01 NOTE — Progress Notes (Signed)
ABG obtained per ARDS protocol.  Ventilator settings include tidal volume of 480, respiratory rate of 30, FIO2 of 80% and PEEP of 12.    Ref. Range 07/01/2017 11:46  Sample type Unknown ARTERIAL  pH, Arterial Latest Ref Range: 7.350 - 7.450  7.381  pCO2 arterial Latest Ref Range: 32.0 - 48.0 mmHg 50.8 (H)  pO2, Arterial Latest Ref Range: 83.0 - 108.0 mmHg 97.0  TCO2 Latest Ref Range: 22 - 32 mmol/L 31  Acid-Base Excess Latest Ref Range: 0.0 - 2.0 mmol/L 4.0 (H)  Bicarbonate Latest Ref Range: 20.0 - 28.0 mmol/L 29.9 (H)  O2 Saturation Latest Units: % 97.0  Patient temperature Unknown 37.8 C  Collection site Unknown ARTERIAL LINE

## 2017-07-01 NOTE — Progress Notes (Signed)
PULMONARY / CRITICAL CARE MEDICINE   Name: Darryl George MRN: [id number] DOB: 05/17/1950    ADMISSION DATE:  2017-07-27 CONSULTATION DATE:  2017/07/27  REFERRING MD:  Dr. Marthann Schiller Penn  CHIEF COMPLAINT:  hypoxia  HISTORY OF PRESENT ILLNESS:   67 year old male with past medical history as below, which is significant for COPD, hypertension, stroke, and tobacco abuse.  He was recently admitted for COPD exacerbation in which she was discharged (2/10) on 4-6 L of supplemental oxygen, azithromycin, and a steroid taper.  He then presented to [facility] emergency department on 2/13 with complaints of severe dyspnea.  He was found to hypoxemic with oxygen saturations in the 50s on a nonrebreather.  He was emergently intubated.  Also developed atrial fibrillation with rapid ventricular rate and was started on amiodarone.  Chest x-ray demonstrated severe diffuse infiltrates.  He immediately required high FiO2 and high PEEP in order to maintain adequate saturations.  He was started on broad-spectrum antibiotics and deeply sedated for vent synchrony.  He required levophed for blood pressure support.  He was transferred to [facility] for higher level ICU care in the setting of presumed ARDS.   SUBJECTIVE:  Of neuromuscular blockade.  Remains deeply sedated. Still requiring high PEEP and Fio2  VITAL SIGNS: BP (!) 100/56   Pulse 63   Temp 100 F (37.8 C)   Resp (!) 29   Ht 6\' 1"  (1.854 m)   Wt 194 lb 14.2 oz (88.4 kg)   SpO2 95%   BMI 25.71 kg/m   HEMODYNAMICS:    VENTILATOR SETTINGS: Vent Mode: PRVC FiO2 (%):  [50 %-80 %] 80 % Set Rate:  [30 bmp] 30 bmp Vt Set:  [480 mL] 480 mL PEEP:  [10 cmH20-12 cmH20] 12 cmH20 Plateau Pressure:  [17 cmH20-22 cmH20] 21 cmH20  INTAKE / OUTPUT: I/O last 3 completed shifts: In: 5336.9 [I.V.:2536.9; NG/GT:2040; IV Piggyback:760] Out: 1700 [Urine:1700]  PHYSICAL EXAMINATION: Gen:      No acute distress HEENT:  EOMI, sclera anicteric, ET tube in  place Neck:     No masses; no thyromegaly Lungs:    Clear to auscultation bilaterally; normal respiratory effort CV:         Regular rate and rhythm; no murmurs Abd:      + bowel sounds; soft, non-tender; no palpable masses, no distension Ext:    No edema; adequate peripheral perfusion Skin:      Warm and dry; no rash Neuro: Sedated, unresponsive  LABS:  BMET Recent Labs  Lab 06/29/17 0500 06/30/17 0325 07/01/17 0351  NA 140 143 146*  K 4.6 3.4* 3.6  CL 107 106 110  CO2 22 26 25   BUN 22* 24* 35*  CREATININE 1.78* 1.97* 2.07*  GLUCOSE 221* 189* 113*   Electrolytes Recent Labs  Lab 06/29/17 0500 06/30/17 0325 07/01/17 0351  CALCIUM 6.8* 6.5* 7.0*  MG 2.2 2.2 2.3  PHOS 6.1* 3.2 2.1*   CBC Recent Labs  Lab 06/29/17 0500 06/30/17 0325 07/01/17 0351  WBC 20.7* 19.6* 16.1*  HGB 12.2* 10.6* 10.0*  HCT 36.1* 32.1* 31.1*  PLT 170 152 184   Coag's No results for input(s): APTT, INR in the last 168 hours.  Sepsis Markers Recent Labs  Lab 27-Jul-2017 2146  LATICACIDVEN 0.97   ABG Recent Labs  Lab 06/29/17 1713 06/30/17 0324 07/01/17 0814  PHART 7.328* 7.390 7.394  PCO2ART 55.5* 48.6* 45.6  PO2ART 96.0 108 48.0*   Liver Enzymes Recent Labs  Lab 07-27-17 1915  AST 55*  ALT 44  ALKPHOS 149*  BILITOT 1.4*  ALBUMIN 2.7*   Cardiac Enzymes Recent Labs  Lab 07/03/2017 1915  TROPONINI 0.03*   Glucose Recent Labs  Lab 06/30/17 1515 06/30/17 1612 06/30/17 2034 06/30/17 2348 07/01/17 0032 07/01/17 0819  GLUCAP 159* 151* 146* 126* 139* 107*    Imaging Dg Chest Port 1 View  Result Date: 07/01/2017 CLINICAL DATA:  Respiratory failure EXAM: PORTABLE CHEST 1 VIEW COMPARISON:  06/30/2017 FINDINGS: Endotracheal tube in good position.  NG enters the stomach. Bilateral airspace disease, left greater than right similar to yesterday. IMPRESSION: Bilateral airspace disease, left greater than right unchanged. Endotracheal tube remains in good position.  Electronically Signed   By: Marlan Palau M.D.   On: 07/01/2017 08:05   STUDIES:  06/30/2017 2D echo> EF 50-55%, PA peak pressure of 52  CULTURES: BCx2 2/13 > Urine 2/13 > RVP 2/14 > negative Tracheal aspirate 2/14 >  ANTIBIOTICS: Zosyn 2/13 > Vancomycin 2/13 >  SIGNIFICANT EVENTS:  LINES/TUBES: ETT 2/13 > Fem CVL 2/13 > 06/29/2017 right radial A-line>>  DISCUSSION: 67 year old male with COPD recently admitted for exacerbation presented again 2/13 with profound hypoxia. Presumed ARDS. Intubated. Transferred to [facility] for ICU care.  Paralyzed for 24 hours.   ASSESSMENT / PLAN:  PULMONARY A: ARDS, multilobar pneumonia COPD on home O2  P:   Continue ARDS protocol Off paralytics Continue antibiotics for pneumonia  CARDIOVASCULAR A:  Shock: presumably septic shock, however, could be related to high PEEP and sedation needs.  Atrial fibrillation with RVR now sinus P:  Telemetry monitoring Off Neo-Synephrine Start Lasix for diuresis.  Start CVP monitoring. Amiodarone infusion stopped on 06/30/2017 for sinus bradycardia  RENAL A:   Elevated creatinine. Seems to be improved from when he was discharged 2/10. No history CKD. Hyponatremia resolved Hypokalemia replete as needed Hypocalcemia replete as needed P:   Follow BUN/creatinine, urine output.  GASTROINTESTINAL A:   No acute issues P:   NPO PPI Continue tube feeds  HEMATOLOGIC Recent Labs    06/30/17 0325 07/01/17 0351  HGB 10.6* 10.0*    A:   No acute issues  P:  SubQ heparin.  Follow CBC  INFECTIOUS A:   PNA vs respiratory infection.  Respiratory virus panel is negative  P:   Continue Vanco, Zosyn No evidence of flu. Follow cultures, pro calcitonin  ENDOCRINE CBG (last 3)  A:   No acute issues  P:   CBG monitoring SSI  NEUROLOGIC A:   Acute metabolic encephalopathy P:   RASS goal:  Versed, fentanyl. Wean as tolerated  FAMILY  - Updates: Live in girlfriend updated at  bedside. 2/16. - Inter-disciplinary family meet or Palliative Care meeting due by:  2/20  The patient is critically ill with multiple organ system failure and requires high complexity decision making for assessment and support, frequent evaluation and titration of therapies, advanced monitoring, review of radiographic studies and interpretation of complex data.   Critical Care Time devoted to patient care services, exclusive of separately billable procedures, described in this note is 35 minutes.   Chilton Greathouse MD [facility] Pager [phone] If no answer or after 3pm call: [phone] 07/01/2017, 10:38 AM

## 2017-07-01 NOTE — Plan of Care (Signed)
  Progressing Clinical Measurements: Ability to maintain clinical measurements within normal limits will improve 07/01/2017 0326 - Progressing by Arlington Calix, RN Will remain free from infection 07/01/2017 0326 - Progressing by Arlington Calix, RN Diagnostic test results will improve 07/01/2017 0326 - Progressing by Arlington Calix, RN Respiratory complications will improve 07/01/2017 0326 - Progressing by Arlington Calix, RN Cardiovascular complication will be avoided 07/01/2017 0326 - Progressing by Arlington Calix, RN Nutrition: Adequate nutrition will be maintained 07/01/2017 0326 - Progressing by Arlington Calix, RN Coping: Level of anxiety will decrease 07/01/2017 0326 - Progressing by Arlington Calix, RN Elimination: Will not experience complications related to bowel motility 07/01/2017 0326 - Progressing by Arlington Calix, RN Will not experience complications related to urinary retention 07/01/2017 0326 - Progressing by Arlington Calix, RN Pain Managment: General experience of comfort will improve 07/01/2017 0326 - Progressing by Arlington Calix, RN Safety: Ability to remain free from injury will improve 07/01/2017 0326 - Progressing by Arlington Calix, RN Skin Integrity: Risk for impaired skin integrity will decrease 07/01/2017 0326 - Progressing by Arlington Calix, RN Respiratory: Ability to maintain a clear airway and adequate ventilation will improve 07/01/2017 0326 - Progressing by Arlington Calix, RN   Not Progressing Education: Knowledge of General Education information will improve 07/01/2017 0326 - Not Progressing by Arlington Calix, RN Health Behavior/Discharge Planning: Ability to manage health-related needs will improve 07/01/2017 0326 - Not Progressing by Arlington Calix, RN Activity: Risk for activity intolerance will decrease 07/01/2017 0326 - Not Progressing by Arlington Calix, RN Role Relationship: Method of communication will  improve 07/01/2017 0326 - Not Progressing by Arlington Calix, RN   Not Applicable Activity: Ability to tolerate increased activity will improve 07/01/2017 0326 - Not Applicable by Arlington Calix, RN

## 2017-07-02 ENCOUNTER — Inpatient Hospital Stay (HOSPITAL_COMMUNITY): Payer: Medicare HMO

## 2017-07-02 LAB — POCT I-STAT 3, ART BLOOD GAS (G3+)
ACID-BASE EXCESS: 7 mmol/L — AB (ref 0.0–2.0)
Bicarbonate: 33 mmol/L — ABNORMAL HIGH (ref 20.0–28.0)
O2 Saturation: 94 %
PH ART: 7.404 (ref 7.350–7.450)
PO2 ART: 74 mmHg — AB (ref 83.0–108.0)
TCO2: 35 mmol/L — ABNORMAL HIGH (ref 22–32)
pCO2 arterial: 53.1 mmHg — ABNORMAL HIGH (ref 32.0–48.0)

## 2017-07-02 LAB — CBC
HCT: 29.9 % — ABNORMAL LOW (ref 39.0–52.0)
Hemoglobin: 9.5 g/dL — ABNORMAL LOW (ref 13.0–17.0)
MCH: 28 pg (ref 26.0–34.0)
MCHC: 31.8 g/dL (ref 30.0–36.0)
MCV: 88.2 fL (ref 78.0–100.0)
PLATELETS: 179 10*3/uL (ref 150–400)
RBC: 3.39 MIL/uL — ABNORMAL LOW (ref 4.22–5.81)
RDW: 16.8 % — AB (ref 11.5–15.5)
WBC: 14.2 10*3/uL — ABNORMAL HIGH (ref 4.0–10.5)

## 2017-07-02 LAB — BASIC METABOLIC PANEL
Anion gap: 12 (ref 5–15)
BUN: 44 mg/dL — AB (ref 6–20)
CO2: 28 mmol/L (ref 22–32)
CREATININE: 2.43 mg/dL — AB (ref 0.61–1.24)
Calcium: 7.2 mg/dL — ABNORMAL LOW (ref 8.9–10.3)
Chloride: 108 mmol/L (ref 101–111)
GFR calc Af Amer: 30 mL/min — ABNORMAL LOW (ref >60)
GFR, EST NON AFRICAN AMERICAN: 26 mL/min — AB (ref >60)
GLUCOSE: 105 mg/dL — AB (ref 65–99)
Potassium: 3.4 mmol/L — ABNORMAL LOW (ref 3.5–5.1)
Sodium: 148 mmol/L — ABNORMAL HIGH (ref 135–145)

## 2017-07-02 LAB — GLUCOSE, CAPILLARY
GLUCOSE-CAPILLARY: 108 mg/dL — AB (ref 65–99)
GLUCOSE-CAPILLARY: 136 mg/dL — AB (ref 65–99)
GLUCOSE-CAPILLARY: 127 mg/dL — AB (ref 65–99)
Glucose-Capillary: 97 mg/dL (ref 65–99)
Glucose-Capillary: 118 mg/dL — ABNORMAL HIGH (ref 65–99)
Glucose-Capillary: 123 mg/dL — ABNORMAL HIGH (ref 65–99)

## 2017-07-02 LAB — PHOSPHORUS: Phosphorus: 3.3 mg/dL (ref 2.5–4.6)

## 2017-07-02 LAB — MAGNESIUM: Magnesium: 2.2 mg/dL (ref 1.7–2.4)

## 2017-07-02 MED ORDER — VANCOMYCIN HCL 10 G IV SOLR
1250.0000 mg | INTRAVENOUS | Status: DC
  Administered 2017-07-03 – 2017-07-08 (×6): 1250 mg via INTRAVENOUS
  Filled 2017-07-02 (×6): qty 1250

## 2017-07-02 MED ORDER — ACETAMINOPHEN 160 MG/5ML PO SOLN
650.0000 mg | Freq: Once | ORAL | Status: AC
  Administered 2017-07-02: 650 mg
  Filled 2017-07-02: qty 20.3

## 2017-07-02 NOTE — Progress Notes (Signed)
eLink Physician-Brief Progress Note Patient Name: Darryl George DOB: 08/08/1950 MRN: [id number]   Date of Service  07/02/2017  HPI/Events of Note  Fever to 102.7 F - Patient is already on Vancomycin and Zosyn. AST = 55 and Creatinine = 2.43, therefore, can't use Tylenol or Motrin.   eICU Interventions  Will order: 1. Cooling blanket PRN. 2. Blood cultures X 2.      Intervention Category Major Interventions: Infection - evaluation and management  Sommer,Steven Eugene 07/02/2017, 9:54 PM

## 2017-07-02 NOTE — Plan of Care (Signed)
  Progressing Clinical Measurements: Respiratory complications will improve 07/02/2017 1127 - Progressing by Marcelino Scot, RN Cardiovascular complication will be avoided 07/02/2017 1127 - Progressing by Marcelino Scot, RN Nutrition: Adequate nutrition will be maintained 07/02/2017 1127 - Progressing by Marcelino Scot, RN Skin Integrity: Risk for impaired skin integrity will decrease 07/02/2017 1127 - Progressing by Marcelino Scot, RN Respiratory: Ability to maintain a clear airway and adequate ventilation will improve 07/02/2017 1127 - Progressing by Marcelino Scot, RN

## 2017-07-02 NOTE — Progress Notes (Signed)
eLink Physician-Brief Progress Note Patient Name: Darryl George DOB: 1951/03/09 MRN: [id number]   Date of Service  07/02/2017  HPI/Events of Note  Temp = 101.0 F - Currently on Vancomycin and Zosyn. AST = 55 and Creatinine 2.07. Therefore, can't give NSAID and prefer to avoid Tylenol if possible.  eICU Interventions  Will order: 1. Tylenol liquid 650 mg per tube X 1 now.  2. If fever persists --> ice packs.      Intervention Category Major Interventions: Infection - evaluation and management  Sommer,Steven Eugene 07/02/2017, 12:12 AM

## 2017-07-02 NOTE — Progress Notes (Signed)
PULMONARY / CRITICAL CARE MEDICINE   Name: Darryl George MRN: [id number] DOB: 06/14/1950    ADMISSION DATE:  07/09/2017 CONSULTATION DATE:  07/09/2017  REFERRING MD:  Dr. Marthann Schiller Penn  CHIEF COMPLAINT:  hypoxia  HISTORY OF PRESENT ILLNESS:   67 year old male with past medical history as below, which is significant for COPD, hypertension, stroke, and tobacco abuse.  He was recently admitted for COPD exacerbation in which she was discharged (2/10) on 4-6 L of supplemental oxygen, azithromycin, and a steroid taper.  He then presented to [facility] emergency department on 2/13 with complaints of severe dyspnea.  He was found to hypoxemic with oxygen saturations in the 50s on a nonrebreather.  He was emergently intubated.  Also developed atrial fibrillation with rapid ventricular rate and was started on amiodarone.  Chest x-ray demonstrated severe diffuse infiltrates.  He immediately required high FiO2 and high PEEP in order to maintain adequate saturations.  He was started on broad-spectrum antibiotics and deeply sedated for vent synchrony.  He required levophed for blood pressure support.  He was transferred to [facility] for higher level ICU care in the setting of presumed ARDS.   SUBJECTIVE:  Still requiring high PEEP, FiO2.  VITAL SIGNS: BP (!) 103/58   Pulse 82   Temp 99.3 F (37.4 C) (Oral)   Resp (!) 23   Ht 6\' 1"  (1.854 m)   Wt 194 lb 14.2 oz (88.4 kg)   SpO2 93%   BMI 25.71 kg/m   HEMODYNAMICS: CVP:  [4 mmHg-8 mmHg] 8 mmHg  VENTILATOR SETTINGS: Vent Mode: PRVC FiO2 (%):  [70 %-80 %] 70 % Set Rate:  [30 bmp] 30 bmp Vt Set:  [480 mL] 480 mL PEEP:  [10 cmH20] 10 cmH20 Plateau Pressure:  [18 cmH20-22 cmH20] 20 cmH20  INTAKE / OUTPUT: I/O last 3 completed shifts: In: 4921.1 [I.V.:1571.1; NG/GT:2550; IV Piggyback:800] Out: 3600 [Urine:3600]  PHYSICAL EXAMINATION: Gen:      No acute distress HEENT:  EOMI, sclera anicteric, ET tube in place Neck:     No masses; no  thyromegaly Lungs:    Clear to auscultation bilaterally; normal respiratory effort CV:         Regular rate and rhythm; no murmurs Abd:      + bowel sounds; soft, non-tender; no palpable masses, no distension Ext:    No edema; adequate peripheral perfusion Skin:      Warm and dry; no rash Neuro: sedated, unresponsive   LABS:  BMET Recent Labs  Lab 06/30/17 0325 07/01/17 0351 07/02/17 0422  NA 143 146* 148*  K 3.4* 3.6 3.4*  CL 106 110 108  CO2 26 25 28   BUN 24* 35* 44*  CREATININE 1.97* 2.07* 2.43*  GLUCOSE 189* 113* 105*   Electrolytes Recent Labs  Lab 06/30/17 0325 07/01/17 0351 07/02/17 0422  CALCIUM 6.5* 7.0* 7.2*  MG 2.2 2.3 2.2  PHOS 3.2 2.1* 3.3   CBC Recent Labs  Lab 06/30/17 0325 07/01/17 0351 07/02/17 0422  WBC 19.6* 16.1* 14.2*  HGB 10.6* 10.0* 9.5*  HCT 32.1* 31.1* 29.9*  PLT 152 184 179   Coag's No results for input(s): APTT, INR in the last 168 hours.  Sepsis Markers Recent Labs  Lab 07/07/2017 2146 07/01/17 1045  LATICACIDVEN 0.97  --   PROCALCITON  --  1.89   ABG Recent Labs  Lab 07/01/17 0814 07/01/17 1146 07/02/17 0434  PHART 7.394 7.381 7.404  PCO2ART 45.6 50.8* 53.1*  PO2ART 48.0* 97.0 74.0*  Liver Enzymes Recent Labs  Lab 07-15-17 1915  AST 55*  ALT 44  ALKPHOS 149*  BILITOT 1.4*  ALBUMIN 2.7*   Cardiac Enzymes Recent Labs  Lab Jul 15, 2017 1915  TROPONINI 0.03*   Glucose Recent Labs  Lab 07/01/17 1159 07/01/17 1530 07/01/17 2122 07/01/17 2326 07/02/17 0343 07/02/17 0816  GLUCAP 105* 115* 123* 105* 97 108*    Imaging Dg Chest Port 1 View  Result Date: 07/02/2017 CLINICAL DATA:  Acute respiratory failure. EXAM: PORTABLE CHEST 1 VIEW COMPARISON:  07/01/2017 FINDINGS: Endotracheal tube has tip 6.5 cm above the carina. Nasogastric tube courses through the stomach and off the inferior portion of the film as tip is not visualized. Patient is rotated to the left. There is a persistent bilateral patchy airspace  process over the mid lung and left base as findings are worse over the left midlung and not significantly changed likely infection. Mild stable cardiomegaly. Remainder of the exam is unchanged. IMPRESSION: Persistent bilateral patchy multifocal airspace process worse over the left midlung without significant change likely due to infection. Tubes and lines as described. Electronically Signed   By: Elberta Fortis M.D.   On: 07/02/2017 07:30   STUDIES:  06/30/2017 2D echo> EF 50-55%, PA peak pressure of 52  CULTURES: BCx2 2/13 > Urine 2/13 > RVP 2/14 > negative Tracheal aspirate 2/15 > Mold, saprophyte  ANTIBIOTICS: Zosyn 2/13 > Vancomycin 2/13 >  SIGNIFICANT EVENTS:  LINES/TUBES: ETT 2/13 > Fem CVL 2/13 > 06/29/2017 right radial A-line>>  DISCUSSION: 67 year old male with COPD recently admitted for exacerbation presented again 2/13 with profound hypoxia. Presumed ARDS. Intubated. Transferred to [facility] for ICU care.  Paralyzed for 24 hours.  ASSESSMENT / PLAN:  PULMONARY A: ARDS, multilobar pneumonia COPD on home O2  P:   Continue ARDS protocol Off paralytics Continue antibiotics for pneumonia  CARDIOVASCULAR A:  Shock: presumably septic shock, however, could be related to high PEEP and sedation needs.  Atrial fibrillation with RVR now sinus P:  Telemetry monitoring Stop lasix as Cr is higher. Amiodarone infusion stopped on 06/30/2017 for sinus bradycardia  RENAL A:   Elevated creatinine. Seems to be improved from when he was discharged 2/10. No history CKD. Hyponatremia resolved Hypokalemia replete as needed Hypocalcemia replete as needed P:   Follow BUN/creatinine, urine output. Holding lasix  GASTROINTESTINAL A:   No acute issues P:   NPO PPI Continue tube feeds  HEMATOLOGIC Recent Labs    07/01/17 0351 07/02/17 0422  HGB 10.0* 9.5*   A:   No acute issues  P:  SubQ heparin.  Follow CBC  INFECTIOUS A:   PNA vs respiratory infection.   Respiratory virus panel is negative  P:   Continue Vanco, Zosyn Follow cultures, pro calcitonin  ENDOCRINE CBG (last 3)  A:   No acute issues  P:   CBG monitoring SSI  NEUROLOGIC A:   Acute metabolic encephalopathy P:   RASS goal:  Versed, fentanyl. Wean as tolerated  FAMILY  - Updates: Live in girlfriend updated at bedside. 2/16. - Inter-disciplinary family meet or Palliative Care meeting due by:  2/20  The patient is critically ill with multiple organ system failure and requires high complexity decision making for assessment and support, frequent evaluation and titration of therapies, advanced monitoring, review of radiographic studies and interpretation of complex data.   Critical Care Time devoted to patient care services, exclusive of separately billable procedures, described in this note is 35 minutes.   Chilton Greathouse MD  [place] Pager [phone] If no answer or after 3pm call: [phone] 07/02/2017, 11:51 AM

## 2017-07-03 ENCOUNTER — Inpatient Hospital Stay (HOSPITAL_COMMUNITY): Payer: Medicare HMO

## 2017-07-03 LAB — CBC
HCT: 29.8 % — ABNORMAL LOW (ref 39.0–52.0)
HEMOGLOBIN: 9.5 g/dL — AB (ref 13.0–17.0)
MCH: 28.1 pg (ref 26.0–34.0)
MCHC: 31.9 g/dL (ref 30.0–36.0)
MCV: 88.2 fL (ref 78.0–100.0)
PLATELETS: 194 10*3/uL (ref 150–400)
RBC: 3.38 MIL/uL — AB (ref 4.22–5.81)
RDW: 16.7 % — ABNORMAL HIGH (ref 11.5–15.5)
WBC: 17 10*3/uL — AB (ref 4.0–10.5)

## 2017-07-03 LAB — CULTURE, BLOOD (ROUTINE X 2)
CULTURE: NO GROWTH
Culture: NO GROWTH
Special Requests: ADEQUATE
Special Requests: ADEQUATE

## 2017-07-03 LAB — BASIC METABOLIC PANEL
ANION GAP: 13 (ref 5–15)
BUN: 49 mg/dL — AB (ref 6–20)
CHLORIDE: 107 mmol/L (ref 101–111)
CO2: 29 mmol/L (ref 22–32)
Calcium: 7.3 mg/dL — ABNORMAL LOW (ref 8.9–10.3)
Creatinine, Ser: 2.31 mg/dL — ABNORMAL HIGH (ref 0.61–1.24)
GFR, EST AFRICAN AMERICAN: 32 mL/min — AB (ref >60)
GFR, EST NON AFRICAN AMERICAN: 27 mL/min — AB (ref >60)
Glucose, Bld: 140 mg/dL — ABNORMAL HIGH (ref 65–99)
POTASSIUM: 3.1 mmol/L — AB (ref 3.5–5.1)
SODIUM: 149 mmol/L — AB (ref 135–145)

## 2017-07-03 LAB — GLUCOSE, CAPILLARY
GLUCOSE-CAPILLARY: 101 mg/dL — AB (ref 65–99)
GLUCOSE-CAPILLARY: 128 mg/dL — AB (ref 65–99)
Glucose-Capillary: 124 mg/dL — ABNORMAL HIGH (ref 65–99)
Glucose-Capillary: 136 mg/dL — ABNORMAL HIGH (ref 65–99)
Glucose-Capillary: 77 mg/dL (ref 65–99)

## 2017-07-03 LAB — BLOOD GAS, ARTERIAL
ACID-BASE EXCESS: 7.8 mmol/L — AB (ref 0.0–2.0)
BICARBONATE: 32.5 mmol/L — AB (ref 20.0–28.0)
Drawn by: 252031
FIO2: 100
LHR: 30 {breaths}/min
MECHVT: 480 mL
O2 Saturation: 94.4 %
PEEP: 10 cmH2O
PO2 ART: 74.9 mmHg — AB (ref 83.0–108.0)
Patient temperature: 98.6
pCO2 arterial: 51.8 mmHg — ABNORMAL HIGH (ref 32.0–48.0)
pH, Arterial: 7.414 (ref 7.350–7.450)

## 2017-07-03 LAB — POCT I-STAT 3, ART BLOOD GAS (G3+)
Acid-Base Excess: 6 mmol/L — ABNORMAL HIGH (ref 0.0–2.0)
Acid-Base Excess: 8 mmol/L — ABNORMAL HIGH (ref 0.0–2.0)
BICARBONATE: 32 mmol/L — AB (ref 20.0–28.0)
BICARBONATE: 33.6 mmol/L — AB (ref 20.0–28.0)
O2 Saturation: 96 %
O2 Saturation: 100 %
PH ART: 7.381 (ref 7.350–7.450)
PO2 ART: 87 mmHg (ref 83.0–108.0)
PO2 ART: 261 mmHg — AB (ref 83.0–108.0)
Patient temperature: 37
Patient temperature: 37.1
TCO2: 34 mmol/L — ABNORMAL HIGH (ref 22–32)
TCO2: 35 mmol/L — AB (ref 22–32)
pCO2 arterial: 57 mmHg — ABNORMAL HIGH (ref 32.0–48.0)
pCO2 arterial: 56.8 mmHg — ABNORMAL HIGH (ref 32.0–48.0)
pH, Arterial: 7.358 (ref 7.350–7.450)

## 2017-07-03 LAB — CULTURE, RESPIRATORY

## 2017-07-03 LAB — BODY FLUID CELL COUNT WITH DIFFERENTIAL
Eos, Fluid: 0 %
Lymphs, Fluid: 0 %
MONOCYTE-MACROPHAGE-SEROUS FLUID: 1 % — AB (ref 50–90)
Neutrophil Count, Fluid: 99 % — ABNORMAL HIGH (ref 0–25)
WBC FLUID: 2270 uL — AB (ref 0–1000)

## 2017-07-03 LAB — STREP PNEUMONIAE URINARY ANTIGEN: Strep Pneumo Urinary Antigen: NEGATIVE

## 2017-07-03 LAB — PHOSPHORUS: PHOSPHORUS: 4.3 mg/dL (ref 2.5–4.6)

## 2017-07-03 LAB — MAGNESIUM: MAGNESIUM: 2.4 mg/dL (ref 1.7–2.4)

## 2017-07-03 LAB — CULTURE, RESPIRATORY W GRAM STAIN

## 2017-07-03 MED ORDER — IPRATROPIUM-ALBUTEROL 0.5-2.5 (3) MG/3ML IN SOLN: 3.0000 mL | RESPIRATORY_TRACT | Status: DC | PRN

## 2017-07-03 MED ORDER — POTASSIUM CHLORIDE 10 MEQ/50ML IV SOLN
10.0000 meq | INTRAVENOUS | Status: AC
  Administered 2017-07-03 (×3): 10 meq via INTRAVENOUS
  Filled 2017-07-03 (×3): qty 50

## 2017-07-03 MED ORDER — SODIUM CHLORIDE 0.9 % IV SOLN
200.0000 mg | Freq: Once | INTRAVENOUS | Status: AC
  Administered 2017-07-03: 200 mg via INTRAVENOUS
  Filled 2017-07-03: qty 200

## 2017-07-03 MED ORDER — DEXTROSE 50 % IV SOLN
INTRAVENOUS | Status: AC
  Administered 2017-07-03: 50 mL
  Filled 2017-07-03: qty 50

## 2017-07-03 MED ORDER — SODIUM CHLORIDE 0.9 % IV SOLN
100.0000 mg | INTRAVENOUS | Status: DC
  Administered 2017-07-04 – 2017-07-07 (×4): 100 mg via INTRAVENOUS
  Filled 2017-07-03 (×4): qty 100

## 2017-07-03 MED ORDER — STERILE WATER FOR INJECTION IJ SOLN
INTRAMUSCULAR | Status: AC
  Administered 2017-07-03: 03:00:00
  Filled 2017-07-03: qty 10

## 2017-07-03 MED ORDER — VECURONIUM BROMIDE 10 MG IV SOLR
5.0000 mg | Freq: Once | INTRAVENOUS | Status: AC
  Administered 2017-07-03: 5 mg via INTRAVENOUS
  Filled 2017-07-03: qty 10

## 2017-07-03 MED ORDER — ARTIFICIAL TEARS OPHTHALMIC OINT
TOPICAL_OINTMENT | Freq: Three times a day (TID) | OPHTHALMIC | Status: DC
  Administered 2017-07-03: 22:00:00 via OPHTHALMIC
  Administered 2017-07-04 (×2): 1 via OPHTHALMIC
  Administered 2017-07-04 – 2017-07-05 (×2): via OPHTHALMIC
  Administered 2017-07-05 – 2017-07-06 (×4): 1 via OPHTHALMIC
  Administered 2017-07-06 – 2017-07-07 (×2): via OPHTHALMIC
  Administered 2017-07-07 – 2017-07-08 (×3): 1 via OPHTHALMIC
  Administered 2017-07-08: 12:00:00 via OPHTHALMIC
  Administered 2017-07-08 – 2017-07-09 (×2): 1 via OPHTHALMIC
  Administered 2017-07-09 (×2): via OPHTHALMIC
  Administered 2017-07-10: 1 via OPHTHALMIC
  Administered 2017-07-10: 13:00:00 via OPHTHALMIC
  Filled 2017-07-03 (×4): qty 3.5

## 2017-07-03 NOTE — Progress Notes (Signed)
Pharmacy Antibiotic Note  Darryl George is a 67 y.o. male admitted on 06/25/2017 with pneumonia.  Pharmacy has been consulted for Vancomycin/Zosyn dosing.   Patient still febrile at 102.7, wbc 17. No growth in the cultures except some mold in the respiratory culture which could be a contaminant. Scr impaired but stable overnight to 2.3. D/w CCM will add antifungal coverage with continued fevers.   Plan: -Continue vancomycin 750mg  IV q24 - recheck trough later this week -Zosyn 3.375G IV q8h to be infused over 4 hours -Add eraxis to coverage   Height: 6\' 1"  (185.4 cm) Weight: 190 lb 4.1 oz (86.3 kg) IBW/kg (Calculated) : 79.9  Temp (24hrs), Avg:100.2 F (37.9 C), Min:97.7 F (36.5 C), Max:102.7 F (39.3 C)  Recent Labs  Lab 06/21/2017 2146 06/29/17 0500 06/30/17 0325 06/30/17 1800 07/01/17 0351 07/02/17 0422 07/03/17 0406  WBC  --  20.7* 19.6*  --  16.1* 14.2* 17.0*  CREATININE  --  1.78* 1.97*  --  2.07* 2.43* 2.31*  LATICACIDVEN 0.97  --   --   --   --   --   --   VANCOTROUGH  --   --   --  17  --   --   --     Estimated Creatinine Clearance: 34.6 mL/min (A) (by C-G formula based on SCr of 2.31 mg/dL (H)).    No Known Allergies   Antimicrobials: 2/13 vancomycin >> 2/13 zosyn >> 2/18 Eraxis>>  Dose Adjustments: 2/15 VT = 17 - no changes  Microbiology: 2/13 blood x2- neg 2/13 urine: ng 2/14 MRSA PCR- neg 2/14 - resp panel - neg 2/17 bld x2   Sheppard Coil PharmD., BCPS Clinical Pharmacist 07/03/2017 1:59 PM

## 2017-07-03 NOTE — Progress Notes (Signed)
RT NOTE:  PRONE POSITION CHANGE  Head turned to RIGHT. ETT secured @ 25 lip   

## 2017-07-03 NOTE — Progress Notes (Addendum)
Hypoglycemic Event  CBG: 42  Treatment: D50 IV 50 mL  Symptoms: None  Follow-up CBG: Time: 0005 CBG Result: 116   Possible Reasons for Event: Inadequate meal intake  Comments/MD notified: Elink notified.    Derek Mound

## 2017-07-03 NOTE — Progress Notes (Signed)
RT NOTE:  PRONE POSITION CHANGE  Head turned toLEFT. ETT secured @ 25 lip.   

## 2017-07-03 NOTE — Progress Notes (Signed)
eLink Physician-Brief Progress Note Patient Name: Darryl George DOB: [birth year] MRN: [id number]   Date of Service  07/03/2017  HPI/Events of Note  K+ = 3.1 and Creatinine = 2.31.   eICU Interventions  Will replace K+.     Intervention Category Major Interventions: Electrolyte abnormality - evaluation and management  Jaxxon Naeem Eugene 07/03/2017, 6:00 AM

## 2017-07-03 NOTE — Progress Notes (Addendum)
PULMONARY / CRITICAL CARE MEDICINE   Name: Darryl George MRN: [id number] DOB: [birth year]    ADMISSION DATE:  06/17/2017 CONSULTATION DATE:  06/18/2017  REFERRING MD:  Dr. Elyse Jarvis Penn  CHIEF COMPLAINT:  hypoxia  HISTORY OF PRESENT ILLNESS:   67 year old male with past medical history as below, which is significant for COPD, hypertension, stroke, and tobacco abuse.  He was recently admitted for COPD exacerbation in which he was discharged (2/10) on 4-6 L of supplemental oxygen, azithromycin, and a steroid taper.  He then presented to [facility] emergency department on 2/13 with complaints of severe dyspnea.  He was found to hypoxemic with oxygen saturations in the 50s on a nonrebreather.  He was emergently intubated.  Also developed atrial fibrillation with rapid ventricular rate and was started on amiodarone.  Chest x-ray demonstrated severe diffuse infiltrates.  He immediately required high FiO2 and high PEEP in order to maintain adequate saturations.  He was started on broad-spectrum antibiotics and deeply sedated for vent synchrony.  He required levophed for blood pressure support.  He was transferred to [facility] for higher level ICU care in the setting of presumed ARDS.   SUBJECTIVE:  Febrile, Oxygenation is worse with dysynchrony.  VITAL SIGNS: BP (!) 111/53   Pulse 89   Temp 98.6 F (37 C) (Core)   Resp 18   Ht _0  (1.854 m)   Wt 190 lb 4.1 oz (86.3 kg)   SpO2 100%   BMI 25.10 kg/m   HEMODYNAMICS: CVP:  [5 mmHg-9 mmHg] 5 mmHg  VENTILATOR SETTINGS: Vent Mode: PRVC FiO2 (%):  [50 %-100 %] 80 % Set Rate:  [30 bmp] 30 bmp Vt Set:  [480 mL] 480 mL PEEP:  [10 cmH20] 10 cmH20 Plateau Pressure:  [18 cmH20-21 cmH20] 19 cmH20  INTAKE / OUTPUT: I/O last 3 completed shifts: In: 4556.6 [I.V.:1296.6; NG/GT:2610; IV Piggyback:650] Out: 2979 [Urine:5070]  PHYSICAL EXAMINATION: Gen:      No acute distress HEENT:  EOMI, sclera anicteric Neck:     No masses; no thyromegaly,  ETT in place Lungs:    Clear to auscultation bilaterally; normal respiratory effort CV:         Regular rate and rhythm; no murmurs Abd:      + bowel sounds; soft, non-tender; no palpable masses, no distension Ext:    No edema; adequate peripheral perfusion Skin:      Warm and dry; no rash Neuro: Sedated, unresponsive  LABS:  BMET Recent Labs  Lab 07/01/17 0351 07/02/17 0422 07/03/17 0406  NA 146* 148* 149*  K 3.6 3.4* 3.1*  CL 110 108 107  CO2 _1 BUN 35* 44* 49*  CREATININE 2.07* 2.43* 2.31*  GLUCOSE 113* 105* 140*   Electrolytes Recent Labs  Lab 07/01/17 0351 07/02/17 0422 07/03/17 0406  CALCIUM 7.0* 7.2* 7.3*  MG 2.3 2.2 2.4  PHOS 2.1* 3.3 4.3   CBC Recent Labs  Lab 07/01/17 0351 07/02/17 0422 07/03/17 0406  WBC 16.1* 14.2* 17.0*  HGB 10.0* 9.5* 9.5*  HCT 31.1* 29.9* 29.8*  PLT 184 179 194   Coag's No results for input(s): APTT, INR in the last 168 hours.  Sepsis Markers Recent Labs  Lab 06/22/2017 2146 07/01/17 1045  LATICACIDVEN 0.97  --   PROCALCITON  --  1.89   ABG Recent Labs  Lab 07/01/17 1146 07/02/17 0434 07/03/17 0415  PHART 7.381 7.404 7.414  PCO2ART 50.8* 53.1* 51.8*  PO2ART 97.0 74.0* 74.9*   Liver  Enzymes Recent Labs  Lab 07/03/2017 1915  AST 55*  ALT 44  ALKPHOS 149*  BILITOT 1.4*  ALBUMIN 2.7*   Cardiac Enzymes Recent Labs  Lab 07/04/2017 1915  TROPONINI 0.03*   Glucose Recent Labs  Lab 07/02/17 1327 07/02/17 1635 07/02/17 1941 07/02/17 2311 07/03/17 0030 07/03/17 0414  GLUCAP 118* 123* 136* 127* 101* 124*    Imaging Dg Chest Port 1 View  Result Date: 07/03/2017 CLINICAL DATA:  Hypoxia EXAM: PORTABLE CHEST 1 VIEW COMPARISON:  July 02, 2017 and April 30, 2016 FINDINGS: Endotracheal tube tip is 6.0 cm above the carina. Nasogastric tube tip and side port are below the diaphragm. No pneumothorax. There is persistent airspace consolidation in the left mid lung. There is underlying fibrotic change  throughout the lungs bilaterally. There are small pleural effusions bilaterally. Heart is mildly enlarged with pulmonary vascularity within normal limits. There is aortic atherosclerosis. Patient is status post coronary artery bypass grafting. IMPRESSION: Tube positions as described without pneumothorax. Persistent consolidation consistent with pneumonia left mid lung. There is underlying pulmonary fibrotic change. There are small pleural effusions bilaterally. There is underlying mild cardiomegaly. There is aortic atherosclerosis. Aortic Atherosclerosis (ICD10-I70.0). Electronically Signed   By: Lowella Grip III M.D.   On: 07/03/2017 07:55   STUDIES:  06/30/2017 2D echo> EF 50-55%, PA peak pressure of 52  CULTURES: BCx2 2/13 > Urine 2/13 > RVP 2/14 > negative Tracheal aspirate 2/15 > Mold, saprophyte  ANTIBIOTICS: Zosyn 2/13 > Vancomycin 2/13 >  SIGNIFICANT EVENTS:  LINES/TUBES: ETT 2/13 > Fem CVL 2/13 > 06/29/2017 right radial A-line>>  DISCUSSION: 67 year old male with COPD recently admitted for exacerbation presented again 2/13 with profound hypoxia. Presumed ARDS. Intubated. Transferred to [facility] for ICU care.    ASSESSMENT / PLAN:  PULMONARY A: ARDS, multilobar pneumonia COPD on home O2 P:   Continue ARDS protocol Restart nimbex. Will plan on bronchoscopy today and then start proning protocol Continue antibiotics for pneumonia  CARDIOVASCULAR A:  Shock: presumably septic shock, however, could be related to high PEEP and sedation needs.  Atrial fibrillation with RVR now sinus P:  Telemetry monitoring Off lasix as cr is higher Amiodarone infusion stopped on 06/30/2017 for sinus bradycardia D/C femoral line, place IJ line. Check CVP  RENAL A:   Elevated creatinine. Seems to be improved from when he was discharged 2/10. No history CKD. Hyponatremia resolved Hypokalemia replete as needed Hypocalcemia replete as needed P:   Follow BUN/creatinine, urine  output. Holding lasix  GASTROINTESTINAL A:   No acute issues P:   NPO PPI Continue tube feeds  HEMATOLOGIC Recent Labs    07/02/17 0422 07/03/17 0406  HGB 9.5* 9.5*   A:   No acute issues  P:  SubQ heparin.  Follow CBC  INFECTIOUS A:   PNA vs respiratory infection.  Respiratory virus panel is negative Mold in sputum. ? pathogen  P:   Continue Vanco, Zosyn. Add eraxis BAL today. Check fungal cultures.  Follow pro calcitonin Follow urine legionella and pneumococcus  ENDOCRINE CBG (last 3)  A:   No acute issues  P:   CBG monitoring SSI  NEUROLOGIC A:   Acute metabolic encephalopathy P:   RASS goal:  Versed, fentanyl. Restart nimbex  FAMILY  - Updates: Family updated daily.  - Inter-disciplinary family meet or Palliative Care meeting due by:  2/20  The patient is critically ill with multiple organ system failure and requires high complexity decision making for assessment and support, frequent evaluation  and titration of therapies, advanced monitoring, review of radiographic studies and interpretation of complex data.   Critical Care Time devoted to patient care services, exclusive of separately billable procedures, described in this note is 35 minutes.   Marshell Garfinkel MD [facility] Pager [phone] If no answer or after 3pm call: [phone] 07/03/2017, 9:26 AM

## 2017-07-03 NOTE — Progress Notes (Addendum)
eLink Physician-Brief Progress Note Patient Name: Darryl George DOB: [birth year] MRN: [id number]   Date of Service  07/03/2017  HPI/Events of Note  17 M with COPD d/c on Sunday on Z-pack.  Presented to [facility] with resp distress/fever/tachy/diffuse pulm infitrates.  Intubated/CVL. VQ scan done on most recent hosp low prob.  ARDS. Paralysed. Now proned. No order to prone patient.    eICU Interventions  Will order: 1. Keep proned.      Intervention Category Evaluation Type: New Patient Evaluation  [name] 07/03/2017, 8:33 PM

## 2017-07-03 NOTE — Procedures (Signed)
Central Venous Catheter Insertion Procedure Note KEMARION CRAKER [id number] 13-Mar-1951  Procedure: Insertion of Central Venous Catheter Indications: Assessment of intravascular volume, Drug and/or fluid administration and Frequent blood sampling  Procedure Details Consent: Risks of procedure as well as the alternatives and risks of each were explained to the (patient/caregiver).  Consent for procedure obtained. Time Out: Verified patient identification, verified procedure, site/side was marked, verified correct patient position, special equipment/implants available, medications/allergies/relevent history reviewed, required imaging and test results available.  Performed  Maximum sterile technique was used including antiseptics, cap, gloves, gown, hand hygiene, mask and sheet. Skin prep: Chlorhexidine; local anesthetic administered A antimicrobial bonded/coated triple lumen catheter was placed in the left internal jugular vein using the Seldinger technique.  Evaluation Blood flow good Complications: No apparent complications Patient did tolerate procedure well. Chest X-ray ordered to verify placement.  CXR: pending.  Procedure performed under direct ultrasound guidance for real time vessel cannulation.      Rutherford Guys, [place] [facility] [place] [facility] Pager: [phone]  or [phone] 07/03/2017, 12:01 PM

## 2017-07-03 NOTE — Progress Notes (Signed)
Elink notified of need for proning orders. Will continue to monitor closely. Modena Jansky RN 2 Heart

## 2017-07-03 NOTE — Procedures (Signed)
Bronchoscopy Procedure Note Darryl George [id number] 1950-10-24  Procedure: Bronchoscopy Indications: Diagnostic evaluation of the airways and Obtain specimens for culture and/or other diagnostic studies  Procedure Details Consent: Risks of procedure as well as the alternatives and risks of each were explained to the (patient/caregiver).  Consent for procedure obtained. Time Out: Verified patient identification, verified procedure, site/side was marked, verified correct patient position, special equipment/implants available, medications/allergies/relevent history reviewed, required imaging and test results available.  Performed  In preparation for procedure, patient was given 100% FiO2 and bronchoscope lubricated. Sedation: Benzodiazepines and Muscle relaxants  Airway entered and the following bronchi were examined: RUL, RML, RLL, LUL, LLL and Bronchi.  Purulent secretion and mucous plug noted in the left lower lobe. Procedures performed: Bronchial lavage performed in the left lower lobe.  Multiple aliquots of saline instilled and mucous plugs suctioned out to open up the lung.  Evaluation Hemodynamic Status: BP stable throughout; O2 sats: stable throughout Patient's Current Condition: stable Specimens:  Sent purulent fluid Complications: No apparent complications Patient did tolerate procedure well.   Sakshi Sermons 07/03/2017

## 2017-07-03 NOTE — Progress Notes (Signed)
eLink Physician-Brief Progress Note Patient Name: Darryl George DOB: Sep 26, 1950 MRN: [id number]   Date of Service  07/03/2017  HPI/Events of Note  Hypoxia - Sat = 85%. However, patient is not synchronous with the ventilator.   eICU Interventions  Will order: 1. Vecuronium 5 mg IV X 1 now.   If his ventilator synchrony and O2 sats improve with the Vecuronium, will need NMB with Nimbex IV infusion restarted.      Intervention Category Major Interventions: Hypoxemia - evaluation and management  Anahy Esh Eugene 07/03/2017, 2:15 AM

## 2017-07-04 ENCOUNTER — Inpatient Hospital Stay (HOSPITAL_COMMUNITY): Payer: Medicare HMO

## 2017-07-04 LAB — BASIC METABOLIC PANEL
Anion gap: 13 (ref 5–15)
BUN: 46 mg/dL — AB (ref 6–20)
CALCIUM: 7.2 mg/dL — AB (ref 8.9–10.3)
CHLORIDE: 111 mmol/L (ref 101–111)
CO2: 27 mmol/L (ref 22–32)
CREATININE: 2.09 mg/dL — AB (ref 0.61–1.24)
GFR calc non Af Amer: 31 mL/min — ABNORMAL LOW (ref >60)
GFR, EST AFRICAN AMERICAN: 36 mL/min — AB (ref >60)
GLUCOSE: 68 mg/dL (ref 65–99)
Potassium: 3.6 mmol/L (ref 3.5–5.1)
Sodium: 151 mmol/L — ABNORMAL HIGH (ref 135–145)

## 2017-07-04 LAB — POCT I-STAT 3, ART BLOOD GAS (G3+)
ACID-BASE EXCESS: 6 mmol/L — AB (ref 0.0–2.0)
Acid-Base Excess: 3 mmol/L — ABNORMAL HIGH (ref 0.0–2.0)
Acid-Base Excess: 4 mmol/L — ABNORMAL HIGH (ref 0.0–2.0)
Bicarbonate: 32.4 mmol/L — ABNORMAL HIGH (ref 20.0–28.0)
Bicarbonate: 28.9 mmol/L — ABNORMAL HIGH (ref 20.0–28.0)
Bicarbonate: 30.3 mmol/L — ABNORMAL HIGH (ref 20.0–28.0)
O2 SAT: 97 %
O2 SAT: 91 %
O2 SAT: 99 %
PCO2 ART: 54.4 mmHg — AB (ref 32.0–48.0)
PCO2 ART: 50.1 mmHg — AB (ref 32.0–48.0)
PCO2 ART: 53.7 mmHg — AB (ref 32.0–48.0)
PO2 ART: 63 mmHg — AB (ref 83.0–108.0)
Patient temperature: 37
Patient temperature: 37.4
TCO2: 34 mmol/L — AB (ref 22–32)
TCO2: 30 mmol/L (ref 22–32)
TCO2: 32 mmol/L (ref 22–32)
pH, Arterial: 7.382 (ref 7.350–7.450)
pH, Arterial: 7.369 (ref 7.350–7.450)
pH, Arterial: 7.362 (ref 7.350–7.450)
pO2, Arterial: 100 mmHg (ref 83.0–108.0)
pO2, Arterial: 133 mmHg — ABNORMAL HIGH (ref 83.0–108.0)

## 2017-07-04 LAB — CBC
HCT: 29.7 % — ABNORMAL LOW (ref 39.0–52.0)
Hemoglobin: 9.4 g/dL — ABNORMAL LOW (ref 13.0–17.0)
MCH: 28.1 pg (ref 26.0–34.0)
MCHC: 31.6 g/dL (ref 30.0–36.0)
MCV: 88.9 fL (ref 78.0–100.0)
Platelets: 231 10*3/uL (ref 150–400)
RBC: 3.34 MIL/uL — ABNORMAL LOW (ref 4.22–5.81)
RDW: 16.9 % — AB (ref 11.5–15.5)
WBC: 15.6 10*3/uL — ABNORMAL HIGH (ref 4.0–10.5)

## 2017-07-04 LAB — GLUCOSE, CAPILLARY
GLUCOSE-CAPILLARY: 116 mg/dL — AB (ref 65–99)
GLUCOSE-CAPILLARY: 43 mg/dL — AB (ref 65–99)
GLUCOSE-CAPILLARY: 58 mg/dL — AB (ref 65–99)
GLUCOSE-CAPILLARY: 69 mg/dL (ref 65–99)
GLUCOSE-CAPILLARY: 77 mg/dL (ref 65–99)
Glucose-Capillary: 93 mg/dL (ref 65–99)
Glucose-Capillary: 97 mg/dL (ref 65–99)
Glucose-Capillary: 85 mg/dL (ref 65–99)
Glucose-Capillary: 98 mg/dL (ref 65–99)

## 2017-07-04 LAB — ACID FAST SMEAR (AFB): ACID FAST SMEAR - AFSCU2: NEGATIVE

## 2017-07-04 LAB — LEGIONELLA PNEUMOPHILA SEROGP 1 UR AG: L. pneumophila Serogp 1 Ur Ag: NEGATIVE

## 2017-07-04 MED ORDER — DEXTROSE-NACL 5-0.45 % IV SOLN
INTRAVENOUS | Status: DC
  Administered 2017-07-04 – 2017-07-07 (×4): via INTRAVENOUS

## 2017-07-04 MED ORDER — DEXTROSE 50 % IV SOLN
INTRAVENOUS | Status: AC
  Administered 2017-07-04: 25 mL
  Filled 2017-07-04: qty 50

## 2017-07-04 MED ORDER — SODIUM CHLORIDE 0.9 % IV BOLUS (SEPSIS)
500.0000 mL | Freq: Once | INTRAVENOUS | Status: AC
  Administered 2017-07-04: 500 mL via INTRAVENOUS

## 2017-07-04 MED ORDER — DEXTROSE 50 % IV SOLN
INTRAVENOUS | Status: AC
  Administered 2017-07-04: 50 mL
  Filled 2017-07-04: qty 50

## 2017-07-04 NOTE — Progress Notes (Signed)
PULMONARY / CRITICAL CARE MEDICINE   Name: Darryl George MRN: [id number] DOB: 1950/07/18    ADMISSION DATE:  06/27/2017 CONSULTATION DATE:  06/27/2017  REFERRING MD:  Dr. Elyse Jarvis Penn  CHIEF COMPLAINT:  hypoxia  HISTORY OF PRESENT ILLNESS:   67 year old male with past medical history as below, which is significant for COPD, hypertension, stroke, and tobacco abuse.  He was recently admitted for COPD exacerbation in which he was discharged (2/10) on 4-6 L of supplemental oxygen, azithromycin, and a steroid taper.  He then presented to [facility] emergency department on 2/13 with complaints of severe dyspnea.  He was found to hypoxemic with oxygen saturations in the 50s on a nonrebreather.  He was emergently intubated.  Also developed atrial fibrillation with rapid ventricular rate and was started on amiodarone.  Chest x-ray demonstrated severe diffuse infiltrates.  He immediately required high FiO2 and high PEEP in order to maintain adequate saturations.  He was started on broad-spectrum antibiotics and deeply sedated for vent synchrony.  He required levophed for blood pressure support.  He was transferred to [facility] for higher level ICU care in the setting of presumed ARDS.   SUBJECTIVE:  Proned yesterdat. No issue overnight O2 requirements are slightly better. BP is stable  VITAL SIGNS: BP (!) 98/56   Pulse 69   Temp 98.4 F (36.9 C) (Core)   Resp (!) 30   Ht _0  (1.854 m)   Wt 190 lb 4.1 oz (86.3 kg)   SpO2 98%   BMI 25.10 kg/m   HEMODYNAMICS: CVP:  [7 mmHg-17 mmHg] 17 mmHg  VENTILATOR SETTINGS: Vent Mode: PRVC FiO2 (%):  [50 %-100 %] 50 % Set Rate:  [30 bmp] 30 bmp Vt Set:  [480 mL] 480 mL PEEP:  [10 cmH20-12 cmH20] 10 cmH20 Plateau Pressure:  [21 cmH20-26 cmH20] 26 cmH20  INTAKE / OUTPUT: I/O last 3 completed shifts: In: 4917.3 [I.V.:2412.3; NG/GT:1695; IV Piggyback:810] Out: 2300 [Urine:2300]  PHYSICAL EXAMINATION: Gen:      No acute distress HEENT:  EOMI,  sclera anicteric Neck:     No masses; no thyromegaly, ETT tube Lungs:    Clear to auscultation bilaterally; normal respiratory effort CV:         Regular rate and rhythm; no murmurs Abd:      + bowel sounds; soft, non-tender; no palpable masses, no distension Ext:    No edema; adequate peripheral perfusion Skin:      Warm and dry; no rash Neuro: Sedated, paralyzed  LABS:  BMET Recent Labs  Lab 07/02/17 0422 07/03/17 0406 07/04/17 0407  NA 148* 149* 151*  K 3.4* 3.1* 3.6  CL 108 107 111  CO2 _1 BUN 44* 49* 46*  CREATININE 2.43* 2.31* 2.09*  GLUCOSE 105* 140* 68   Electrolytes Recent Labs  Lab 07/01/17 0351 07/02/17 0422 07/03/17 0406 07/04/17 0407  CALCIUM 7.0* 7.2* 7.3* 7.2*  MG 2.3 2.2 2.4  --   PHOS 2.1* 3.3 4.3  --    CBC Recent Labs  Lab 07/02/17 0422 07/03/17 0406 07/04/17 0407  WBC 14.2* 17.0* 15.6*  HGB 9.5* 9.5* 9.4*  HCT 29.9* 29.8* 29.7*  PLT 179 194 231   Coag's No results for input(s): APTT, INR in the last 168 hours.  Sepsis Markers Recent Labs  Lab 07/04/2017 2146 07/01/17 1045  LATICACIDVEN 0.97  --   PROCALCITON  --  1.89   ABG Recent Labs  Lab 07/03/17 1516 07/03/17 2218 07/04/17 2542  PHART 7.358 7.381 7.382  PCO2ART 57.0* 56.8* 54.4*  PO2ART 87.0 261.0* 100.0   Liver Enzymes Recent Labs  Lab 07/08/2017 1915  AST 55*  ALT 44  ALKPHOS 149*  BILITOT 1.4*  ALBUMIN 2.7*   Cardiac Enzymes Recent Labs  Lab 06/25/2017 1915  TROPONINI 0.03*   Glucose Recent Labs  Lab 07/03/17 1932 07/04/17 0012 07/04/17 0411 07/04/17 0512 07/04/17 0747 07/04/17 0816  GLUCAP 77 116* 58* 97 85 77    Imaging Dg Chest Port 1 View  Result Date: 07/03/2017 CLINICAL DATA:  Acute respiratory failure.  History of COPD. EXAM: PORTABLE CHEST 1 VIEW COMPARISON:  07/03/2017 and 07/02/2017 radiographs. FINDINGS: 1642 hr. Endotracheal, enteric and left IJ central venous catheters are unchanged. 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 Left greater than right perihilar airspace opacities have not significantly changed. There is no pneumothorax or significant pleural effusion. The bones appear unchanged. IMPRESSION: Stable support system and lines and bilateral airspace opacities. No pneumothorax. Electronically Signed   By: Richardean Sale M.D.   On: 07/03/2017 16:59   Dg Chest Port 1 View  Result Date: 07/03/2017 CLINICAL DATA:  Central line placement EXAM: PORTABLE CHEST 1 VIEW COMPARISON:  07/03/2017 FINDINGS: Left jugular central venous catheter tip in the SVC in good position. No pneumothorax. Endotracheal tube in good position. NG tube enters the stomach with the tip not visualized Bibasilar infiltrates left greater than right unchanged. No significant effusion. IMPRESSION: Central line in the SVC without pneumothorax. Bibasilar airspace disease left greater than right unchanged. Electronically Signed   By: Franchot Gallo M.D.   On: 07/03/2017 12:47   STUDIES:  06/30/2017 2D echo> EF 50-55%, PA peak pressure of 52  CULTURES: BCx2 2/13 > Urine 2/13 > RVP 2/14 > negative Tracheal aspirate 2/15 > Mold, saprophyte BAL 2/18>   ANTIBIOTICS: Zosyn 2/13 > Vancomycin 2/13 > Eraxis 2/18>  SIGNIFICANT EVENTS:  LINES/TUBES: ETT 2/13 > Fem CVL 2/13 > 2/18 Lt IJ CVL 2/18>  06/29/2017 right radial A-line>>  DISCUSSION: 67 year old male with COPD recently admitted for exacerbation presented again 2/13 with profound hypoxia. Presumed ARDS. Intubated. Transferred to [facility] for ICU care.    ASSESSMENT / PLAN:  PULMONARY A: ARDS, multilobar pneumonia COPD on home O2 P:   Continue ARDS protocol Continue paralysis and proning for 1 more day.  Continue antibiotics for pneumonia. Follow BAL cultures  CARDIOVASCULAR A:  Shock: presumably septic shock, however, could be related to high PEEP and sedation needs.  Atrial fibrillation with RVR now sinus P:  Telemetry monitoring Holding  lasix Amiodarone infusion stopped on 06/30/2017 for sinus bradycardia Check CVP when placed in supine position.  RENAL A:   Elevated creatinine. Seems to be improved from when he was discharged 2/10. No history CKD. Hyponatremia resolved Hypokalemia replete as needed Hypocalcemia replete as needed P:   Follow BUN/creatinine, urine output. Holding lasix  GASTROINTESTINAL A:   No acute issues P:   NPO PPI Holding tube feeds  HEMATOLOGIC Recent Labs    07/03/17 0406 07/04/17 0407  HGB 9.5* 9.4*   A:   No acute issues  P:  SubQ heparin.  Follow CBC  INFECTIOUS A:   PNA vs respiratory infection.  Respiratory virus panel is negative Mold in sputum. ? pathogen  P:   Continue Vanco, Zosyn. Added eraxis Follow pro calcitonin Follow urine legionella and pneumococcus  ENDOCRINE CBG (last 3)  A:   No acute issues  P:  CBG monitoring SSI  NEUROLOGIC A:   Acute metabolic encephalopathy P:   RASS goal:  Versed, fentanyl. Restarted nimbex  FAMILY  - Updates: Family updated daily. Will get Palliative care involved today - Inter-disciplinary family meet or Palliative Care meeting due by:  2/20  The patient is critically ill with multiple organ system failure and requires high complexity decision making for assessment and support, frequent evaluation and titration of therapies, advanced monitoring, review of radiographic studies and interpretation of complex data.   Critical Care Time devoted to patient care services, exclusive of separately billable procedures, described in this note is 35 minutes.   Marshell Garfinkel MD [facility] Pager [phone] If no answer or after 3pm call: [phone] 07/04/2017, 8:56 AM

## 2017-07-04 NOTE — Progress Notes (Signed)
RT NOTE:  PRONE POSITION CHANGE  Head turned toLEFT. ETT secured @ 25 lip.   

## 2017-07-04 NOTE — Progress Notes (Signed)
RT NOTE:  PRONE POSITION CHANGE  Head turned to RIGHT. ETT secured @ 25 lip   

## 2017-07-04 NOTE — Progress Notes (Signed)
RT assisted in repositioning the patient to prone.  ETT secured at 25cm.

## 2017-07-04 NOTE — Progress Notes (Signed)
Nutrition Follow-up  DOCUMENTATION CODES:   Not applicable  INTERVENTION:   Recommend resuming TF tomorrow once proneing complete and pt placed back in supine position  Tube Feeding:  Vital AF 1.2 @ 70 ml/hr Provides 126 g of protein, 2016 kcals, 1361 mL of free water  NUTRITION DIAGNOSIS:   Inadequate oral intake related to inability to eat as evidenced by NPO status.  Continues  GOAL:   Patient will meet greater than or equal to 90% of their needs  Not progressing  MONITOR:   Vent status, Labs, Weight trends, TF tolerance, Skin, I & O's  REASON FOR ASSESSMENT:   Ventilator, Consult Enteral/tube feeding initiation and management  ASSESSMENT:   67 year old male with COPD recently admitted for exacerbation presented again 2/13 with profound hypoxia. PResumed ARDS. Intubated. Transferred to [facility] for ICU care. Deeply sedated and neuromuscular blockade.   Patient is currently intubated on ventilator support MV: 14.1 L/min Temp (24hrs), Avg:98.6 F (37 C), Min:98.4 F (36.9 C), Max:98.8 F (37.1 C)  TF on hold for proneing (TF last received on 2/18) Noted episode of hypoglycemia post holding of TF OG tube in stomach  Recommend POST-PYLORIC feeding tube placement with re-initiation of enteral nutrition if continue to place patient in Prone Position  Discussed with Sam RN. Per RN, Cortrak placement was discussed with MD this AM and no plans to place Cortrak as pt will only be "proned" one more time starting this evening. If this is the plan, recommend resuming TF tomorrow once pt placed back in Supine position  Weight up since admission, + 12 L since admission per I/O flow sheet. Mild generalized edema present.   Labs: sodium 151 (H), Creatinine 2.09, CBGs 43-116 Meds: D5-1/2 NS at  50 ml/hr started today, fentanyl, versed, nimbex, levophed   Diet Order:  Diet NPO time specified  EDUCATION NEEDS:   Education needs have been addressed  Skin:  Skin Assessment:  Skin Integrity Issues: Skin Integrity Issues:: DTI DTI: sacrum  Last BM:  no documented BM  Height:   Ht Readings from Last 1 Encounters:  06/29/17 6\' 1"  (1.854 m)    Weight: used EDW of 82.9 kg in equations  Wt Readings from Last 1 Encounters:  07/03/17 190 lb 4.1 oz (86.3 kg)    Ideal Body Weight:  83.6 kg  BMI:  Body mass index is 25.1 kg/m.  Estimated Nutritional Needs:   Kcal:  2010 kcals  Protein:  125-166 g  Fluid:  > 2 L  [employer] MS, RD, LDN, CNSC [phone] Pager  [phone] Weekend/On-Call Pager

## 2017-07-04 NOTE — Progress Notes (Signed)
Peep changed to 14 per verbal order from Dr. Isaiah Serge. MD asked that peep remain at 14 and only titrate FIO2 based on sats.

## 2017-07-04 NOTE — Plan of Care (Signed)
  Progressing Respiratory: Ability to maintain a clear airway and adequate ventilation will improve 07/04/2017 0215 - Progressing by Derek Mound, RN Note Pt currently proned

## 2017-07-04 NOTE — Progress Notes (Signed)
Inpatient Diabetes Program Recommendations  AACE/ADA: New Consensus Statement on Inpatient Glycemic Control (2015)  Target Ranges:  Prepandial:   less than 140 mg/dL      Peak postprandial:   less than 180 mg/dL (1-2 hours)      Critically ill patients:  140 - 180 mg/dL   Lab Results  Component Value Date   GLUCAP 77 07/04/2017   HGBA1C 5.7 (H) 07/31/2016    Review of Glycemic Control Results for Darryl George, Darryl George (MRN [id number]) as of 07/04/2017 11:21  Ref. Range 07/04/2017 00:12 07/04/2017 04:11 07/04/2017 05:12 07/04/2017 07:47 07/04/2017 08:16  Glucose-Capillary Latest Ref Range: 65 - 99 mg/dL 051 (H) 58 (L) 97 85 77   Inpatient Diabetes Program Recommendations:   Noted hypoglycemia.  -Discontinue Levemir @ this time   Thank you, Billy Fischer. Jarrel Knoke, RN, MSN, CDE  Diabetes Coordinator Inpatient Glycemic Control Team Team Pager [phone] (8am-5pm) 07/04/2017 11:21 AM

## 2017-07-04 NOTE — Progress Notes (Signed)
Head rotated to the right. ETT secured at 25.

## 2017-07-04 NOTE — Progress Notes (Signed)
Hypoglycemic Event  CBG: 53  Treatment: D50 IV 25 mL  Symptoms: None  Follow-up CBG: Time:0445 CBG Result: tbd  Possible Reasons for Event: Inadequate meal intake  Comments/MD notified: MD to round    Derek Mound

## 2017-07-04 NOTE — Progress Notes (Signed)
RT assisted in rotating the pt from prone position to supine position.  ETT secure at 25 cm.  Pt suctioned by ETT following the rotation, pt tolerated well.

## 2017-07-04 NOTE — Progress Notes (Signed)
eLink Physician-Brief Progress Note Patient Name: Darryl George DOB: 1951/01/04 MRN: [id number]   Date of Service  07/04/2017  HPI/Events of Note  Hypoglycemia - Blood glucose = 30's. Given D50. Tube feeds held d/t proning. Lantus also held by report. Na= = 149.  eICU Interventions  Will order: 1. D5 0.45 NaCl to run IV at 50 mL/hour.      Intervention Category Major Interventions: Other:  [name] 07/04/2017, 12:40 AM

## 2017-07-04 NOTE — Progress Notes (Signed)
RT NOTE:  PRONE POSITION CHANGE  Head turned toLEFT. ETT secured @ 25 lip.

## 2017-07-04 NOTE — Care Management Note (Signed)
Case Management Note  Patient Details  Name: Darryl George MRN: [id number] Date of Birth: 11/20/1950  Subjective/Objective:   Pt admitted with ARDS secondary multilobar PNA               Action/Plan:  PTA independent from home.  Pt is now intubated an proned.  Palliative consult ordered   Expected Discharge Date:                  Expected Discharge Plan:     In-House Referral:     Discharge planning Services  CM Consult  Post Acute Care Choice:    Choice offered to:     DME Arranged:    DME Agency:     HH Arranged:    HH Agency:     Status of Service:  In process, will continue to follow  If discussed at Long Length of Stay Meetings, dates discussed:    Additional Comments:  Cherylann Parr, RN 07/04/2017, 2:27 PM

## 2017-07-05 ENCOUNTER — Inpatient Hospital Stay (HOSPITAL_COMMUNITY): Payer: Medicare HMO

## 2017-07-05 DIAGNOSIS — Z515 Encounter for palliative care: Secondary | ICD-10-CM

## 2017-07-05 DIAGNOSIS — Z7189 Other specified counseling: Secondary | ICD-10-CM

## 2017-07-05 LAB — CBC
HEMATOCRIT: 29.7 % — AB (ref 39.0–52.0)
Hemoglobin: 9.3 g/dL — ABNORMAL LOW (ref 13.0–17.0)
MCH: 28.4 pg (ref 26.0–34.0)
MCHC: 31.3 g/dL (ref 30.0–36.0)
MCV: 90.5 fL (ref 78.0–100.0)
PLATELETS: 262 10*3/uL (ref 150–400)
RBC: 3.28 MIL/uL — AB (ref 4.22–5.81)
RDW: 17 % — ABNORMAL HIGH (ref 11.5–15.5)
WBC: 12.9 10*3/uL — AB (ref 4.0–10.5)

## 2017-07-05 LAB — POCT I-STAT 3, ART BLOOD GAS (G3+)
ACID-BASE EXCESS: 4 mmol/L — AB (ref 0.0–2.0)
Acid-Base Excess: 2 mmol/L (ref 0.0–2.0)
BICARBONATE: 29.4 mmol/L — AB (ref 20.0–28.0)
Bicarbonate: 28.4 mmol/L — ABNORMAL HIGH (ref 20.0–28.0)
O2 Saturation: 96 %
O2 Saturation: 90 %
PCO2 ART: 50.4 mmHg — AB (ref 32.0–48.0)
PCO2 ART: 54.9 mmHg — AB (ref 32.0–48.0)
PH ART: 7.374 (ref 7.350–7.450)
PH ART: 7.324 — AB (ref 7.350–7.450)
PO2 ART: 89 mmHg (ref 83.0–108.0)
Patient temperature: 37.1
TCO2: 31 mmol/L (ref 22–32)
TCO2: 30 mmol/L (ref 22–32)
pO2, Arterial: 66 mmHg — ABNORMAL LOW (ref 83.0–108.0)

## 2017-07-05 LAB — CULTURE, BAL-QUANTITATIVE W GRAM STAIN: Special Requests: NORMAL

## 2017-07-05 LAB — GLUCOSE, CAPILLARY
GLUCOSE-CAPILLARY: 100 mg/dL — AB (ref 65–99)
GLUCOSE-CAPILLARY: 109 mg/dL — AB (ref 65–99)
GLUCOSE-CAPILLARY: 115 mg/dL — AB (ref 65–99)
GLUCOSE-CAPILLARY: 150 mg/dL — AB (ref 65–99)
GLUCOSE-CAPILLARY: 80 mg/dL (ref 65–99)
GLUCOSE-CAPILLARY: 82 mg/dL (ref 65–99)
Glucose-Capillary: 109 mg/dL — ABNORMAL HIGH (ref 65–99)

## 2017-07-05 LAB — PHOSPHORUS: Phosphorus: 3.8 mg/dL (ref 2.5–4.6)

## 2017-07-05 LAB — BASIC METABOLIC PANEL
ANION GAP: 11 (ref 5–15)
BUN: 43 mg/dL — AB (ref 6–20)
CHLORIDE: 111 mmol/L (ref 101–111)
CO2: 27 mmol/L (ref 22–32)
Calcium: 7.1 mg/dL — ABNORMAL LOW (ref 8.9–10.3)
Creatinine, Ser: 1.97 mg/dL — ABNORMAL HIGH (ref 0.61–1.24)
GFR calc Af Amer: 38 mL/min — ABNORMAL LOW (ref >60)
GFR, EST NON AFRICAN AMERICAN: 33 mL/min — AB (ref >60)
GLUCOSE: 115 mg/dL — AB (ref 65–99)
POTASSIUM: 4 mmol/L (ref 3.5–5.1)
Sodium: 149 mmol/L — ABNORMAL HIGH (ref 135–145)

## 2017-07-05 LAB — CULTURE, BAL-QUANTITATIVE: CULTURE: NORMAL — AB

## 2017-07-05 LAB — MAGNESIUM: Magnesium: 2.5 mg/dL — ABNORMAL HIGH (ref 1.7–2.4)

## 2017-07-05 LAB — PROCALCITONIN: Procalcitonin: 1 ng/mL

## 2017-07-05 MED ORDER — STERILE WATER FOR INJECTION IJ SOLN
INTRAMUSCULAR | Status: AC
  Administered 2017-07-05: 10 mL
  Filled 2017-07-05: qty 10

## 2017-07-05 MED ORDER — METOCLOPRAMIDE HCL 5 MG/ML IJ SOLN
5.0000 mg | Freq: Once | INTRAMUSCULAR | Status: AC
  Administered 2017-07-05: 5 mg via INTRAVENOUS
  Filled 2017-07-05: qty 2

## 2017-07-05 MED ORDER — VECURONIUM BROMIDE 10 MG IV SOLR
5.0000 mg | Freq: Once | INTRAVENOUS | Status: AC
  Administered 2017-07-05: 5 mg via INTRAVENOUS

## 2017-07-05 MED ORDER — VECURONIUM BROMIDE 10 MG IV SOLR
10.0000 mg | INTRAVENOUS | Status: DC | PRN
  Administered 2017-07-05 (×2): 10 mg via INTRAVENOUS
  Filled 2017-07-05 (×2): qty 10

## 2017-07-05 MED ORDER — ACETAMINOPHEN 650 MG RE SUPP
650.0000 mg | Freq: Four times a day (QID) | RECTAL | Status: DC | PRN
  Administered 2017-07-05 – 2017-07-09 (×3): 650 mg via RECTAL
  Filled 2017-07-05 (×3): qty 1

## 2017-07-05 MED ORDER — VECURONIUM BROMIDE 10 MG IV SOLR
INTRAVENOUS | Status: AC
  Administered 2017-07-05: 5 mg via INTRAVENOUS
  Filled 2017-07-05: qty 10

## 2017-07-05 NOTE — H&P (Addendum)
WOC Nurse wound consult note Reason for Consult: Pt is critically ill and has been receiving Pronation therapy to attempt to optimize lung function.   Wound type: A dark purple -red deep tissue injury, 13X7 was noted to sacrum on 2/18  Pressure Injury POA: No Measurement: Today, the location has decreased slightly; 10X4.5cm, edges beginning to lift into a clear fluid-filled blister. Induration when area palpated. Dressing procedure/placement/frequency: Foam dressing is in place to protect and promote healing.  Deep tissue injuries are high risk to evolve into full thickness tissue loss.  No family present to discuss plan of care and pt is not responsive at this time. Please re-consult if further assistance is needed.  Thank-you,  Cammie Mcgee MSN, RN, CWOCN, [place], CNS [phone]

## 2017-07-05 NOTE — Progress Notes (Signed)
RT NOTE:  PRONE POSITION CHANGE  Head turned to RIGHT. ETT secured @ 25 lip   

## 2017-07-05 NOTE — Progress Notes (Signed)
RT NOTE:  PRONE POSITION CHANGE  Head turned toLEFT. ETT secured @ 25 lip.   

## 2017-07-05 NOTE — Progress Notes (Signed)
RT NOTE:  PRONE POSITION CHANGE  Head turned to RIGHT. ETT secured @ 25 lip

## 2017-07-05 NOTE — Consult Note (Addendum)
Consultation Note Date: 07/05/2017   Patient Name: Darryl George  DOB: [birth year]  MRN: [id number]  Age / Sex: 67 y.o., male  PCP: System, Provider Not In Referring Physician: Marshell Garfinkel, MD  Reason for Consultation: Establishing goals of care and Psychosocial/spiritual support  HPI/Patient Profile: 67 y.o. male  admitted on 07/07/2017 with PMH significant for COPD, hypertension, stroke, and tobacco abuse.    He was recently admitted for COPD exacerbation in which he was discharged (2/10) on 4-6 L of supplemental oxygen, azithromycin, and a steroid taper.  He then presented to [facility] emergency department on 2/13 with complaints of severe dyspnea.    He was found to hypoxemic with oxygen saturations in the 50s on a nonrebreather.  He was emergently intubated.    Also developed atrial fibrillation with rapid ventricular rate and was started on amiodarone.  Chest x-ray demonstrated severe diffuse infiltrates.    Today is day 7 of this hospitalization and he remains intubated 2/2 to ARDS, multilobar pneumonia and underlying COPD.  Family face continue treatment option decisions and  advanced directive decisions.  Palliative medicine team consult   Clinical Assessment and Goals of Care:  Concept of Hospice and Palliative Care were discussed  This NP Wadie Lessen reviewed medical records, received report from team, assessed the patient and then meet at the patient's bedside along with his SO Cain Sieve  to discuss diagnosis, prognosis, GOC, EOL wishes disposition and options.     A detailed discussion was had today regarding advanced directives.  Concepts specific to code status, artifical feeding and hydration, continued IV antibiotics and rehospitalization was had.  The difference between a aggressive medical intervention path  and a palliative comfort care path for this patient at this time was had.   Values and goals of care important to patient and family were attempted to be elicited.  Katie clearly verbalizes that she is not the sole Media planner and directs me to call patient's sister Marko Plume which I did and had the same conversation with her.  All family and significant persons in [name] life are working together in his best interest.  Both significant other and the patient's sister verbalize an understanding of the seriousness of current medical situation and the likely poor prognosis.  Both verbalize and knowing that the patient would not want prolonged trajectory at end of life.  All agree that there will never be a trach or a PEG placed.  I discussed with the patient's sister Bethena Roys the process of a one-way extubation to shift focus of care to comfort, quality and dignity.  Natural trajectory and expectations at EOL were discussed.    Questions and concerns addressed.   Family encouraged to call with questions or concerns.    PMT will continue to support holistically.  Once again there is no documented healthcare power of attorney.  The patient has 2 daughters who are not consistently available for input.  The patient's 2 sisters are his main support and decision makers at this  point in time.  SUMMARY OF RECOMMENDATIONS    Code Status/Advance Care Planning:  Full code   Additional Recommendations (Limitations, Scope, Preferences):  Full Scope Treatment  --until family makes decisions and further delineates goals of care   Sister Bethena Roys tells me that she will call the palliative medicine team schedule a meeting once she gathers all family members together   * She called back and a meeting is scheduled  for Monday at 3:00   *   Psycho-social/Spiritual:   Emotional support offered  Prognosis:   Unable to determine-prognosis is dependent on decision for life prolonging measures  Discharge Planning: To Be Determined      Primary Diagnoses: Present on  Admission: . ARDS (adult respiratory distress syndrome) ([place])   I have reviewed the medical record, interviewed the patient and family, and examined the patient. The following aspects are pertinent.  Past Medical History:  Diagnosis Date  . Back pain   . COPD (chronic obstructive pulmonary disease) ([place])   . Hypertension   . Stroke [facility])    Noted on MRI scan performed at [facility]  . Tobacco abuse    Social History   Socioeconomic History  . Marital status: Divorced    Spouse name: None  . Number of children: None  . Years of education: None  . Highest education level: None  Social Needs  . Financial resource strain: None  . Food insecurity - worry: None  . Food insecurity - inability: None  . Transportation needs - medical: None  . Transportation needs - non-medical: None  Occupational History  . None  Tobacco Use  . Smoking status: Former Smoker    Packs/day: 1.00    Types: Cigarettes  . Smokeless tobacco: Never Used  Substance and Sexual Activity  . Alcohol use: No    Comment: once a month  . Drug use: No  . Sexual activity: None  Other Topics Concern  . None  Social History Narrative  . None   Family History  Problem Relation Age of Onset  . Cancer Mother   . Heart failure Mother   . Kidney disease Mother   . Aneurysm Father   . Hypertension Father   . Cancer Other   . Breast cancer Sister    Scheduled Meds: . artificial tears   Both Eyes Q8H  . chlorhexidine gluconate (MEDLINE KIT)  15 mL Mouth Rinse BID  . Chlorhexidine Gluconate Cloth  6 each Topical Q0600  . heparin  5,000 Units Subcutaneous Q8H  . insulin aspart  2-6 Units Subcutaneous Q4H  . insulin detemir  5 Units Subcutaneous Q12H  . mouth rinse  15 mL Mouth Rinse 10 times per day  . pantoprazole sodium  40 mg Per Tube Daily  . sodium chloride flush  10-40 mL Intracatheter Q12H  . sodium chloride flush  3 mL Intravenous Q12H   Continuous Infusions: . sodium chloride    . sodium  chloride 250 mL (07/05/17 0600)  . anidulafungin Stopped (07/05/17 1233)  . cisatracurium (NIMBEX) infusion Stopped (07/05/17 1105)  . dextrose 5 % and 0.45% NaCl 50 mL/hr at 07/05/17 0600  . feeding supplement (VITAL AF 1.2 CAL) 1,000 mL (07/05/17 1249)  . fentaNYL infusion INTRAVENOUS 300 mcg/hr (07/05/17 1346)  . midazolam (VERSED) infusion 10 mg/hr (07/05/17 0600)  . norepinephrine (LEVOPHED) Adult infusion 2 mcg/min (07/05/17 1217)  . piperacillin-tazobactam (ZOSYN)  IV 3.375 g (07/05/17 1346)  . vancomycin Stopped (07/05/17 0719)   PRN Meds:.sodium chloride, sodium  chloride, fentaNYL, fentaNYL (SUBLIMAZE) injection, ipratropium-albuterol, midazolam, midazolam, sodium chloride flush, sodium chloride flush, vecuronium Medications Prior to Admission:  Prior to Admission medications   Medication Sig Start Date End Date Taking? Authorizing Provider  albuterol (PROVENTIL HFA;VENTOLIN HFA) 108 (90 Base) MCG/ACT inhaler Inhale 2 puffs into the lungs every 6 (six) hours as needed for wheezing or shortness of breath.   Yes [provider]  atorvastatin (LIPITOR) 80 MG tablet Take 1 tablet (80 mg total) by mouth daily at 6 PM. Patient taking differently: Take 80 mg by mouth at bedtime.  02/11/16  Yes Isaiah Serge, NP  azithromycin (ZITHROMAX) 250 MG tablet Take one tablet daily for 4 days 06/26/17  Yes Isaac Bliss, Rayford Halsted, MD  Melatonin 3 MG TABS Take 6 mg by mouth at bedtime.   Yes [provider]  predniSONE (DELTASONE) 10 MG tablet Take 1 tablet (10 mg total) by mouth daily with breakfast. Take 6 tablets today and then decrease by 1 tablet daily until none are left. 06/25/17  Yes Isaac Bliss, Rayford Halsted, MD   No Known Allergies Review of Systems  Unable to perform ROS: Intubated    Physical Exam  Constitutional: He appears well-developed. He appears ill. He is intubated.  Cardiovascular: Normal rate and regular rhythm.  Pulmonary/Chest: He is intubated.    Neurological: He is unresponsive.  Skin: Skin is warm and dry.    Vital Signs: BP (!) 96/53   Pulse 79   Temp (!) 100.6 F (38.1 C)   Resp (!) 30   Ht 6' 1"  (1.854 m)   Wt 73.6 kg (162 lb 3.8 oz) Comment: prone bed. questionable accuracy   SpO2 94%   BMI 21.40 kg/m  Pain Assessment: CPOT POSS *See Group Information*: 1-Acceptable,Awake and alert Pain Score: 0-No pain   SpO2: SpO2: 94 % O2 Device:SpO2: 94 % O2 Flow Rate: .   IO: Intake/output summary:   Intake/Output Summary (Last 24 hours) at 07/05/2017 1435 Last data filed at 07/05/2017 1346 Gross per 24 hour  Intake 3894.77 ml  Output 1180 ml  Net 2714.77 ml    LBM: Last BM Date: (PTA) Baseline Weight: Weight: 80.7 kg (178 lb) Most recent weight: Weight: 73.6 kg (162 lb 3.8 oz)(prone bed. questionable accuracy )     Palliative Assessment/Data:       Time In: 1345 Time Out: 1500 Time Total: 75 minutes Greater than 50%  of this time was spent counseling and coordinating care related to the above assessment and plan.  Signed by: Wadie Lessen, NP   Please contact Palliative Medicine Team phone at [phone] for questions and concerns.  For individual provider: See Shea Evans

## 2017-07-05 NOTE — Progress Notes (Addendum)
PULMONARY / CRITICAL CARE MEDICINE   Name: Darryl George MRN: [id number] DOB: 26-Feb-1951    ADMISSION DATE:  06/24/2017 CONSULTATION DATE:  07/08/2017  REFERRING MD:  Dr. Elyse Jarvis Penn  CHIEF COMPLAINT:  hypoxia  HISTORY OF PRESENT ILLNESS:   67 year old male with past medical history as below, which is significant for COPD, hypertension, stroke, and tobacco abuse.  He was recently admitted for COPD exacerbation in which he was discharged (2/10) on 4-6 L of supplemental oxygen, azithromycin, and a steroid taper.  He then presented to [facility] emergency department on 2/13 with complaints of severe dyspnea.  He was found to hypoxemic with oxygen saturations in the 50s on a nonrebreather.  He was emergently intubated.  Also developed atrial fibrillation with rapid ventricular rate and was started on amiodarone.  Chest x-ray demonstrated severe diffuse infiltrates.  He immediately required high FiO2 and high PEEP in order to maintain adequate saturations.  He was started on broad-spectrum antibiotics and deeply sedated for vent synchrony.  He required levophed for blood pressure support.  He was transferred to [facility] for higher level ICU care in the setting of presumed ARDS.   SUBJECTIVE:  Stable resp status. Tolerating prone positioning  VITAL SIGNS: BP 105/61 (BP Location: Left Arm)   Pulse 78   Temp 99 F (37.2 C)   Resp (!) 30   Ht _0  (1.854 m)   Wt 162 lb 3.8 oz (73.6 kg) Comment: prone bed. questionable accuracy   SpO2 95%   BMI 21.40 kg/m   HEMODYNAMICS: CVP:  [6 mmHg-16 mmHg] 14 mmHg  VENTILATOR SETTINGS: Vent Mode: PRVC FiO2 (%):  [50 %-80 %] 50 % Set Rate:  [30 bmp] 30 bmp Vt Set:  [480 mL] 480 mL PEEP:  [10 cmH20-14 cmH20] 14 cmH20 Plateau Pressure:  [18 cmH20-29 cmH20] 24 cmH20  INTAKE / OUTPUT: I/O last 3 completed shifts: In: 5457.5 [I.V.:4337.5; NG/GT:90; IV Piggyback:1030] Out: 2070 [Urine:2070]  PHYSICAL EXAMINATION: Blood pressure 105/61, pulse  78, temperature 99 F (37.2 C), resp. rate (!) 30, height _1  (1.854 m), weight 162 lb 3.8 oz (73.6 kg), SpO2 95 %. Gen:      No acute distress HEENT:  EOMI, sclera anicteric Neck:     No masses; no thyromegaly Lungs:    Clear to auscultation bilaterally; normal respiratory effort CV:         Regular rate and rhythm; no murmurs Abd:      + bowel sounds; soft, non-tender; no palpable masses, no distension Ext:    No edema; adequate peripheral perfusion Skin:      Warm and dry; no rash Neuro: Sedated, paralyzed  LABS:  BMET Recent Labs  Lab 07/03/17 0406 07/04/17 0407 07/05/17 0350  NA 149* 151* 149*  K 3.1* 3.6 4.0  CL 107 111 111  CO2 _2 BUN 49* 46* 43*  CREATININE 2.31* 2.09* 1.97*  GLUCOSE 140* 68 115*   Electrolytes Recent Labs  Lab 07/02/17 0422 07/03/17 0406 07/04/17 0407 07/05/17 0350  CALCIUM 7.2* 7.3* 7.2* 7.1*  MG 2.2 2.4  --  2.5*  PHOS 3.3 4.3  --  3.8   CBC Recent Labs  Lab 07/03/17 0406 07/04/17 0407 07/05/17 0350  WBC 17.0* 15.6* 12.9*  HGB 9.5* 9.4* 9.3*  HCT 29.8* 29.7* 29.7*  PLT 194 231 262   Coag's No results for input(s): APTT, INR in the last 168 hours.  Sepsis Markers Recent Labs  Lab 06/20/2017 2146 07/01/17  1045  LATICACIDVEN 0.97  --   PROCALCITON  --  1.89   ABG Recent Labs  Lab 07/04/17 1112 07/04/17 2131 07/05/17 0407  PHART 7.369 7.362 7.374  PCO2ART 50.1* 53.7* 50.4*  PO2ART 63.0* 133.0* 89.0   Liver Enzymes Recent Labs  Lab 06/23/2017 1915  AST 55*  ALT 44  ALKPHOS 149*  BILITOT 1.4*  ALBUMIN 2.7*   Cardiac Enzymes Recent Labs  Lab 07/07/2017 1915  TROPONINI 0.03*   Glucose Recent Labs  Lab 07/04/17 1217 07/04/17 1629 07/04/17 1937 07/05/17 0052 07/05/17 0404 07/05/17 0834  GLUCAP 69 98 100* 115* 109* 82    Imaging Dg Chest Port 1 View  Result Date: 07/04/2017 CLINICAL DATA:  F/u, pt hx sepsis, pneumonia, copd, htn, smoker, ETT, OGT EXAM: PORTABLE CHEST 1 VIEW COMPARISON:   07/03/2017 FINDINGS: Interstitial and patchy airspace lung opacities, greater on the left, are without convincing change from the prior study allowing differences in patient positioning and technique. No new lung abnormalities. No pneumothorax. Endotracheal tube, nasal/orogastric tube and left internal jugular central venous line are stable and well positioned. IMPRESSION: 1. No significant change from the previous day's study. 2. Persistent bilateral, left greater than right, interstitial and airspace lung opacities consistent with multifocal pneumonia. 3. Support apparatus is stable and well positioned. Electronically Signed   By: Lajean Manes M.D.   On: 07/04/2017 12:35   STUDIES:  06/30/2017 2D echo> EF 50-55%, PA peak pressure of 52  CULTURES: BCx2 2/13 > Urine 2/13 > RVP 2/14 > negative Tracheal aspirate 2/15 > Mold, saprophyte BAL 2/18>   ANTIBIOTICS: Zosyn 2/13 > Vancomycin 2/13 > Eraxis 2/18>  SIGNIFICANT EVENTS: 2/14- Admit with ARDS, paralyzed for 24 hrs with improvement 2/17- Worsening oxygenation, dyssynchrony. paralyzed again.  2/18- Bronch, BAL, started proning 2/20- Weaning off nimbex, continue proning  LINES/TUBES: ETT 2/13 > Fem CVL 2/13 > 2/18 Lt IJ CVL 2/18>  06/29/2017 right radial A-line>>  DISCUSSION: 67 year old male with COPD recently admitted for exacerbation presented again 2/13 with profound hypoxia. Presumed ARDS. Intubated. Transferred to [facility] for ICU care.    ASSESSMENT / PLAN:  PULMONARY A: ARDS, multilobar pneumonia, flu negative COPD on home O2 Discussed with [facility] 2/19- Not a candidate for ECMO due to age, co morbidities and as he has stabilized on proning.  P:   Continue ARDS protocol Continue proning. Wean off paralytics Continue antibiotics for pneumonia. Follow BAL cultures  CARDIOVASCULAR A:  Shock: presumably septic shock, however, could be related to high PEEP and sedation needs.  Atrial fibrillation with RVR now sinus P:   Telemetry monitoring Holding lasix due to high cr, CVP is low at 8 Amiodarone infusion stopped on 06/30/2017 for sinus bradycardia Levophed as needed for low BP  RENAL A:   Elevated creatinine. Seems to be improved from when he was discharged 2/10. No history CKD. Hyponatremia resolved Hypokalemia replete as needed Hypocalcemia replete as needed P:   Follow BUN/creatinine, urine output. Holding lasix  GASTROINTESTINAL A:   No acute issues P:   NPO PPI Place cortrak when supine, Start trickle feeds  HEMATOLOGIC Recent Labs    07/04/17 0407 07/05/17 0350  HGB 9.4* 9.3*   A:   No acute issues  P:  SubQ heparin.  Follow CBC  INFECTIOUS A:   PNA vs respiratory infection.  Respiratory virus panel is negative Mold in sputum. ? pathogen  P:   Continue Vanco, Zosyn. Added eraxis due to mold on sputum Recheck Pct. Stop abx  if it is improving and BAL cultures are negative  ENDOCRINE CBG (last 3)  A:   No acute issues  P:   CBG monitoring SSI  NEUROLOGIC A:   Acute metabolic encephalopathy P:   RASS goal:  Versed, fentanyl. Wean off nimbex  FAMILY  - Updates: Family updated daily. Palliative care meeting today - Inter-disciplinary family meet or Palliative Care meeting due by:  2/20  The patient is critically ill with multiple organ system failure and requires high complexity decision making for assessment and support, frequent evaluation and titration of therapies, advanced monitoring, review of radiographic studies and interpretation of complex data.   Critical Care Time devoted to patient care services, exclusive of separately billable procedures, described in this note is 35 minutes.   Marshell Garfinkel MD [place] [facility] Pager [phone] If no answer or after 3pm call: [phone] 07/05/2017, 8:49 AM

## 2017-07-05 NOTE — Plan of Care (Signed)
  Progressing Respiratory: Ability to maintain a clear airway and adequate ventilation will improve 07/05/2017 0330 - Progressing by Derek Mound, RN Note Blood gas improving with pronation therapy.

## 2017-07-05 NOTE — Progress Notes (Signed)
RT note: RT RNs with deproning patient.Patient tolerated well vital signs stable at this time.

## 2017-07-05 NOTE — Progress Notes (Signed)
Cortrak Tube Team Note:  RD to pt's room to advance Cortrak tube from earlier placement this morning. Tube was at the level of the pylorus prior to advancement. A 10 F Cortrak tube remained in the R nare and was advanced and secured with a nasal bridle at 94 cm. Per the Cortrak monitor reading the tube tip is at the Ligament of [place]itz.   X-ray is required, abdominal x-ray has been ordered by the Cortrak team. Please confirm tube placement before using the Cortrak tube.   If the tube becomes dislodged please keep the tube and contact the Cortrak team at [email or web address] (password [email or web address]) for replacement.  If after hours and replacement cannot be delayed, place a NG tube and confirm placement with an abdominal x-ray.    Earma Reading, MS, RD, LDN Pager: [phone] Weekend/After Hours: [phone]

## 2017-07-05 NOTE — Progress Notes (Signed)
Patient was placed on 100% FIO2 & proned at 15:15 with w RT's & 3 RN's without any complications. Patient's vitals remained stable & patient was placed back on 100% FIO2 with SAT's of 100%.

## 2017-07-05 NOTE — Progress Notes (Signed)
Cortrak Tube Team Note:  Consult received to place a Cortrak feeding tube. Per RN, pt to be moved back to prone position this afternoon.  A 10 F Cortrak tube was placed in the R nare and secured with a nasal bridle at 86 cm. Per the Cortrak monitor George the tube tip is post pyloric.   X-ray is required, abdominal x-ray has been ordered by the Cortrak team. Please confirm tube placement before using the Cortrak tube.   If the tube becomes dislodged please keep the tube and contact the Cortrak team at [email or web address] (password [email or web address]) for replacement.  If after hours and replacement cannot be delayed, place a NG tube and confirm placement with an abdominal x-ray.    Darryl Reading, MS, RD, LDN Pager: [phone] Weekend/After Hours: [phone]

## 2017-07-05 NOTE — Progress Notes (Signed)
Dr. Katrinka Blazing w/ [facility] notified of BIS running 50-65. Some concern for increased sedation needs. No orders received at this time. Will continue to monitor. Modena Jansky RN 2 Heart

## 2017-07-06 ENCOUNTER — Inpatient Hospital Stay (HOSPITAL_COMMUNITY): Payer: Medicare HMO

## 2017-07-06 LAB — BLOOD GAS, ARTERIAL
ACID-BASE EXCESS: 5.8 mmol/L — AB (ref 0.0–2.0)
BICARBONATE: 31.1 mmol/L — AB (ref 20.0–28.0)
DRAWN BY: 252031
FIO2: 0.6
LHR: 30 {breaths}/min
O2 SAT: 98.5 %
PCO2 ART: 57.4 mmHg — AB (ref 32.0–48.0)
PEEP/CPAP: 14 cmH2O
Patient temperature: 98.6
VT: 480 mL
pH, Arterial: 7.353 (ref 7.350–7.450)
pO2, Arterial: 142 mmHg — ABNORMAL HIGH (ref 83.0–108.0)

## 2017-07-06 LAB — CBC
HEMATOCRIT: 28 % — AB (ref 39.0–52.0)
Hemoglobin: 8.7 g/dL — ABNORMAL LOW (ref 13.0–17.0)
MCH: 28.6 pg (ref 26.0–34.0)
MCHC: 31.1 g/dL (ref 30.0–36.0)
MCV: 92.1 fL (ref 78.0–100.0)
Platelets: 261 10*3/uL (ref 150–400)
RBC: 3.04 MIL/uL — AB (ref 4.22–5.81)
RDW: 16.9 % — ABNORMAL HIGH (ref 11.5–15.5)
WBC: 9.9 10*3/uL (ref 4.0–10.5)

## 2017-07-06 LAB — BASIC METABOLIC PANEL
Anion gap: 10 (ref 5–15)
BUN: 44 mg/dL — AB (ref 6–20)
CO2: 26 mmol/L (ref 22–32)
CREATININE: 1.98 mg/dL — AB (ref 0.61–1.24)
Calcium: 7.1 mg/dL — ABNORMAL LOW (ref 8.9–10.3)
Chloride: 112 mmol/L — ABNORMAL HIGH (ref 101–111)
GFR calc Af Amer: 38 mL/min — ABNORMAL LOW (ref >60)
GFR calc non Af Amer: 33 mL/min — ABNORMAL LOW (ref >60)
GLUCOSE: 148 mg/dL — AB (ref 65–99)
POTASSIUM: 3.7 mmol/L (ref 3.5–5.1)
SODIUM: 148 mmol/L — AB (ref 135–145)

## 2017-07-06 LAB — GLUCOSE, CAPILLARY
GLUCOSE-CAPILLARY: 110 mg/dL — AB (ref 65–99)
GLUCOSE-CAPILLARY: 144 mg/dL — AB (ref 65–99)
Glucose-Capillary: 118 mg/dL — ABNORMAL HIGH (ref 65–99)
Glucose-Capillary: 154 mg/dL — ABNORMAL HIGH (ref 65–99)
Glucose-Capillary: 53 mg/dL — ABNORMAL LOW (ref 65–99)

## 2017-07-06 LAB — POCT I-STAT 3, ART BLOOD GAS (G3+)
Acid-Base Excess: 1 mmol/L (ref 0.0–2.0)
Bicarbonate: 28.1 mmol/L — ABNORMAL HIGH (ref 20.0–28.0)
O2 Saturation: 90 %
PCO2 ART: 60.2 mmHg — AB (ref 32.0–48.0)
TCO2: 30 mmol/L (ref 22–32)
pH, Arterial: 7.278 — ABNORMAL LOW (ref 7.350–7.450)
pO2, Arterial: 69 mmHg — ABNORMAL LOW (ref 83.0–108.0)

## 2017-07-06 LAB — MAGNESIUM: MAGNESIUM: 2.6 mg/dL — AB (ref 1.7–2.4)

## 2017-07-06 LAB — PHOSPHORUS: Phosphorus: 3.7 mg/dL (ref 2.5–4.6)

## 2017-07-06 LAB — PROCALCITONIN: Procalcitonin: 0.85 ng/mL

## 2017-07-06 MED ORDER — PRO-STAT SUGAR FREE PO LIQD
30.0000 mL | Freq: Three times a day (TID) | ORAL | Status: DC
  Administered 2017-07-06 – 2017-07-07 (×5): 30 mL via ORAL
  Filled 2017-07-06 (×6): qty 30

## 2017-07-06 MED ORDER — VITAL AF 1.2 CAL PO LIQD
1000.0000 mL | ORAL | Status: DC
  Administered 2017-07-06 – 2017-07-07 (×2): 1000 mL

## 2017-07-06 MED ORDER — FUROSEMIDE 10 MG/ML IJ SOLN
40.0000 mg | Freq: Two times a day (BID) | INTRAMUSCULAR | Status: DC
  Administered 2017-07-06 (×2): 40 mg via INTRAVENOUS
  Filled 2017-07-06 (×2): qty 4

## 2017-07-06 NOTE — Progress Notes (Signed)
Nutrition Follow-up  DOCUMENTATION CODES:   Non-severe (moderate) malnutrition in context of acute illness/injury  INTERVENTION:   Tube Feeding:  Recommend increasing Vital AF 1.2 to goal rate of 65 ml/hr Pro-Stat 30 mL TID Provides 2172 kcals, 162 g of protein and 1264 mL of free water  No documented BM since admission. Recommend scheduled bowel regimen, checking for impaction  NUTRITION DIAGNOSIS:   Moderate Malnutrition related to acute illness(ARDS, COPD exacerbation, recent lung infection) as evidenced by energy intake < 75% for > 7 days, mild muscle depletion, edema.  Progressing  GOAL:   Provide needs based on ASPEN/SCCM guidelines  Progressing  MONITOR:   Vent status, Labs, Weight trends, TF tolerance, Skin, I & O's  REASON FOR ASSESSMENT:   Ventilator, Consult Enteral/tube feeding initiation and management  ASSESSMENT:   67 year old male with COPD recently admitted for exacerbation presented again 2/13 with profound hypoxia. PResumed ARDS. Intubated. Transferred to [facility] for ICU care. Deeply sedated and neuromuscular blockade.   12/21 Cortrak tube placed; post-pyloric placement confirmed. Tip in 4th portion of duodenum per xray  Remains on ARDS protocol, prone positioning, attempting to wean off paralytics Patient is currently intubated on ventilator support MV: 13.8 L/min Temp (24hrs), Avg:99.5 F (37.5 C), Min:97 F (36.1 C), Max:101.8 F (38.8 C)  Vital AF 1.2 infusing at 40 ml/hr   Per family, pt appetite had been down for at least 3 weeks prior to admission when pt had a lung infection, fall. Pt eating very little if anything.   Labs: sodium 148, Creatinine 1.98, BUN 44 Meds: D5-1/2 NS at 50, nimbex, lasix, ss novolog, levemir  NUTRITION - FOCUSED PHYSICAL EXAM:    Most Recent Value  Orbital Region  No depletion  Upper Arm Region  No depletion  Thoracic and Lumbar Region  No depletion  Buccal Region  Unable to assess  Temple Region  Unable  to assess  Clavicle Bone Region  No depletion  Clavicle and Acromion Bone Region  No depletion  Scapular Bone Region  No depletion  Dorsal Hand  No depletion  Patellar Region  Mild depletion  Anterior Thigh Region  Moderate depletion  Posterior Calf Region  Mild depletion  Edema (RD Assessment)  Moderate  Hair  Reviewed  Eyes  Reviewed  Mouth  Reviewed  Skin  Reviewed  Nails  Reviewed       Diet Order:  Diet NPO time specified  EDUCATION NEEDS:   Education needs have been addressed  Skin:  Skin Assessment: Skin Integrity Issues: Skin Integrity Issues:: DTI DTI: sacrum (decreasing in size per WOC RN)  Last BM:  no documented BM  Height:   Ht Readings from Last 1 Encounters:  06/29/17 6\' 1"  (1.854 m)    Weight:   Wt Readings from Last 1 Encounters:  07/06/17 160 lb 4.4 oz (72.7 kg)    Ideal Body Weight:  83.6 kg  BMI:  Body mass index is 21.15 kg/m.  Estimated Nutritional Needs:   Kcal:  2188 kcals  Protein:  125-166 g  Fluid:  > 2 L   Romelle Starcher MS, RD, LDN, CNSC [phone] Pager  [phone] Weekend/On-Call Pager

## 2017-07-06 NOTE — Progress Notes (Signed)
RT NOTE:  PRONE POSITION CHANGE  Head turned to RIGHT. ETT secured @ 25 lip   

## 2017-07-06 NOTE — Progress Notes (Signed)
RT NOTE:  PRONE POSITION CHANGE  Head turned toLEFT. ETT secured @ 25 lip.   

## 2017-07-06 NOTE — Progress Notes (Signed)
0730: patient assessed 0930: patient supine 1020: patient hypotensive- levo gtt restarted 1200: patient reassessed- no changes unless otherwise charted 1600: patient reassessed- no changes unless otherwise charted 1630: patient proned 1635: levo gtt off

## 2017-07-06 NOTE — Care Management Note (Signed)
Case Management Note  Patient Details  Name: Darryl George MRN: [id number] Date of Birth: Apr 18, 1951  Subjective/Objective:   Pt admitted with ARDS secondary multilobar PNA               Action/Plan:  PTA independent from home.  Pt is now intubated an proned.  Palliative consult ordered   Expected Discharge Date:                  Expected Discharge Plan:     In-House Referral:     Discharge planning Services  CM Consult  Post Acute Care Choice:    Choice offered to:     DME Arranged:    DME Agency:     HH Arranged:    HH Agency:     Status of Service:  In process, will continue to follow  If discussed at Long Length of Stay Meetings, dates discussed:    Additional Comments: 07/06/2017 GOC discussions ongoing Cherylann Parr, RN 07/06/2017, 3:53 PM

## 2017-07-06 NOTE — Progress Notes (Addendum)
PULMONARY / CRITICAL CARE MEDICINE   Name: Darryl George MRN: [id number] DOB: 1951-04-14    ADMISSION DATE:  07/04/2017 CONSULTATION DATE:  06/18/2017  REFERRING MD:  Dr. Elyse Jarvis Penn  CHIEF COMPLAINT:  hypoxia  HISTORY OF PRESENT ILLNESS:   67 year old male with past medical history as below, which is significant for COPD, hypertension, stroke, and tobacco abuse.  He was recently admitted for COPD exacerbation in which he was discharged (2/10) on 4-6 L of supplemental oxygen, azithromycin, and a steroid taper.  He then presented to [facility] emergency department on 2/13 with complaints of severe dyspnea.  He was found to hypoxemic with oxygen saturations in the 50s on a nonrebreather.  He was emergently intubated.  Also developed atrial fibrillation with rapid ventricular rate and was started on amiodarone.  Chest x-ray demonstrated severe diffuse infiltrates.  He immediately required high FiO2 and high PEEP in order to maintain adequate saturations.  He was started on broad-spectrum antibiotics and deeply sedated for vent synchrony.  He required levophed for blood pressure support.  He was transferred to [facility] for higher level ICU care in the setting of presumed ARDS.   SUBJECTIVE:  Tolerating prone positioning. Becomes dyssynchronous when sedation, paralytics are off.   VITAL SIGNS: BP (!) 96/55 (BP Location: Left Arm)   Pulse 74   Temp 98.2 F (36.8 C)   Resp (!) 27   Ht _0  (1.854 m)   Wt 160 lb 4.4 oz (72.7 kg)   SpO2 97%   BMI 21.15 kg/m   HEMODYNAMICS: CVP:  [6 mmHg-15 mmHg] 14 mmHg  VENTILATOR SETTINGS: Vent Mode: PRVC FiO2 (%):  [50 %-65 %] 50 % Set Rate:  [20 bmp-30 bmp] 30 bmp Vt Set:  [480 mL] 480 mL PEEP:  [14 cmH20] 14 cmH20 Plateau Pressure:  [16 cmH20-24 cmH20] 22 cmH20  INTAKE / OUTPUT: I/O last 3 completed shifts: In: 5370.7 [I.V.:4053.4; NG/GT:787.3; IV Piggyback:530] Out: 2050 [Urine:2050]  PHYSICAL EXAMINATION: Blood pressure (!) 96/55,  pulse 74, temperature 98.2 F (36.8 C), resp. rate (!) 27, height _1  (1.854 m), weight 160 lb 4.4 oz (72.7 kg), SpO2 97 %. Gen:      No acute distress HEENT:  EOMI, sclera anicteric, ETT tube in place Neck:     No masses; no thyromegaly Lungs:    Clear to auscultation bilaterally; normal respiratory effort CV:         Regular rate and rhythm; no murmurs Abd:      + bowel sounds; soft, non-tender; no palpable masses, no distension Ext:    No edema; adequate peripheral perfusion Skin:      Warm and dry; no rash Neuro: Sedated, paralyzed  LABS:  BMET Recent Labs  Lab 07/04/17 0407 07/05/17 0350 07/06/17 0316  NA 151* 149* 148*  K 3.6 4.0 3.7  CL 111 111 112*  CO2 _2 BUN 46* 43* 44*  CREATININE 2.09* 1.97* 1.98*  GLUCOSE 68 115* 148*   Electrolytes Recent Labs  Lab 07/03/17 0406 07/04/17 0407 07/05/17 0350 07/06/17 0316  CALCIUM 7.3* 7.2* 7.1* 7.1*  MG 2.4  --  2.5* 2.6*  PHOS 4.3  --  3.8 3.7   CBC Recent Labs  Lab 07/04/17 0407 07/05/17 0350 07/06/17 0316  WBC 15.6* 12.9* 9.9  HGB 9.4* 9.3* 8.7*  HCT 29.7* 29.7* 28.0*  PLT 231 262 261   Coag's No results for input(s): APTT, INR in the last 168 hours.  Sepsis Markers Recent  Labs  Lab 07/01/17 1045 07/05/17 0903 07/06/17 0316  PROCALCITON 1.89 1.00 0.85   ABG Recent Labs  Lab 07/05/17 0407 07/05/17 1150 07/06/17 0315  PHART 7.374 7.324* 7.353  PCO2ART 50.4* 54.9* 57.4*  PO2ART 89.0 66.0* 142*   Liver Enzymes No results for input(s): AST, ALT, ALKPHOS, BILITOT, ALBUMIN in the last 168 hours. Cardiac Enzymes No results for input(s): TROPONINI, PROBNP in the last 168 hours. Glucose Recent Labs  Lab 07/05/17 0834 07/05/17 1218 07/05/17 1640 07/05/17 2032 07/06/17 0014 07/06/17 0329  GLUCAP 82 80 109* 150* 154* 118*    Imaging Dg Chest Port 1 View  Result Date: 07/05/2017 CLINICAL DATA:  Hypoxia EXAM: PORTABLE CHEST 1 VIEW COMPARISON:  July 04, 2017 FINDINGS: Endotracheal  tube tip is 7.0 cm above the carina. Central catheter tip is in the superior vena cava. Feeding tube tip is below the diaphragm. No pneumothorax. There is extensive consolidation in the left mid lung, stable. There is patchy infiltrate throughout the right mid and lower lung zones as well as in the medial left base. There is a small left pleural effusion. Heart is mildly enlarged with pulmonary vascularity within normal limits. Patient is status post coronary artery bypass grafting. There is calcification in the left carotid artery. IMPRESSION: Tube and catheter positions as described without evident pneumothorax. Multifocal areas of airspace consolidation, likely multifocal pneumonia. Consolidation is greatest in the left mid lung, stable. The overall appearance of the lungs is stable compared to 1 day prior. Cardiac silhouette is likewise stable. There is a rather minimal left pleural effusion, also stable. Electronically Signed   By: Lowella Grip III M.D.   On: 07/05/2017 12:26   Dg Abd Portable 1v  Result Date: 07/05/2017 CLINICAL DATA:  Feeding tube placement. EXAM: PORTABLE ABDOMEN - 1 VIEW 2:20 p.m. COMPARISON:  07/05/2017 at 11:56 a.m. FINDINGS: The feeding tube tip is now in the fourth portion of the duodenum in good position. Bowel gas pattern is normal. Bilateral pulmonary infiltrates, unchanged. IMPRESSION: Feeding tube tip is in the fourth portion of the duodenum. Electronically Signed   By: Lorriane Shire M.D.   On: 07/05/2017 14:49   Dg Abd Portable 1v  Result Date: 07/05/2017 CLINICAL DATA:  Feeding tube placement EXAM: PORTABLE ABDOMEN - 1 VIEW COMPARISON:  None. FINDINGS: Feeding tube tip is at the level of the pylorus. Bowel gas pattern is normal. There is patchy infiltrate in the lung bases. IMPRESSION: Feeding tube tip at level of pylorus. Bowel gas pattern unremarkable. Electronically Signed   By: Lowella Grip III M.D.   On: 07/05/2017 12:24   STUDIES:  06/30/2017 2D echo>  EF 50-55%, PA peak pressure of 52  CULTURES: BCx2 2/13 > Urine 2/13 > RVP 2/14 > negative Tracheal aspirate 2/15 > Mold, saprophyte BAL 2/18>   ANTIBIOTICS: Zosyn 2/13 > Vancomycin 2/13 > Eraxis 2/18>  SIGNIFICANT EVENTS: 2/14- Admit with ARDS, paralyzed for 24 hrs with improvement 2/17- Worsening oxygenation, dyssynchrony. paralyzed again.  2/18- Bronch, BAL, started proning 2/20- Weaning off nimbex, continue proning  LINES/TUBES: ETT 2/13 > Fem CVL 2/13 > 2/18 Lt IJ CVL 2/18>  06/29/2017 right radial A-line>>  DISCUSSION: 67 year old male with COPD recently admitted for exacerbation presented again 2/13 with profound hypoxia. Presumed ARDS. Intubated. Transferred to [facility] for ICU care.    ASSESSMENT / PLAN:  PULMONARY A: ARDS, multilobar pneumonia, flu negative COPD on home O2 Discussed with [facility] 2/19- Not a candidate for ECMO due to age, co morbidities  and as he has stabilized on proning.  P:   Continue ARDS protocol Continue proning. Wean off paralytics Continue antibiotics for pneumonia. Follow BAL cultures  CARDIOVASCULAR A:  Shock: presumably septic shock, however, could be related to high PEEP and sedation needs.  Atrial fibrillation with RVR now sinus P:  Telemetry monitoring Amiodarone infusion stopped on 06/30/2017 for sinus bradycardia Levophed as needed for low BP  RENAL A:   Elevated creatinine. Improving Hyponatremia resolved Hypokalemia replete as needed Hypocalcemia replete as needed P:   Follow BUN/creatinine, urine output. Lasix previously held due to AKI. Resume lasix 40 mg IV q12 Follow CVP   GASTROINTESTINAL A:   No acute issues P:   NPO PPI Trickle feeds  HEMATOLOGIC Recent Labs    07/05/17 0350 07/06/17 0316  HGB 9.3* 8.7*   A:   No acute issues  P:  SubQ heparin.  Follow CBC  INFECTIOUS A:   PNA vs respiratory infection.  Respiratory virus panel is negative Mold in sputum. ? pathogen  P:   Continue Vanco,  Zosyn, eraxis Recheck Pct. Stop abx if BAL cultures are negative  ENDOCRINE CBG (last 3)  A:   No acute issues  P:   CBG monitoring SSI  NEUROLOGIC A:   Acute metabolic encephalopathy P:   RASS goal:  Versed, fentanyl. Start propofol Wean off nimbex  Sacral Ulcer Wound care following  FAMILY  - Updates: Family updated daily. Palliative care meeting yesterday. No trach, peg Will work towards optimizing resp status before extubation.  - Inter-disciplinary family meet or Palliative Care meeting: Done  The patient is critically ill with multiple organ system failure and requires high complexity decision making for assessment and support, frequent evaluation and titration of therapies, advanced monitoring, review of radiographic studies and interpretation of complex data.   Critical Care Time devoted to patient care services, exclusive of separately billable procedures, described in this note is 35 minutes.   Marshell Garfinkel MD [place] [facility] Pager [phone] If no answer or after 3pm call: [phone] 07/06/2017, 8:36 AM

## 2017-07-07 ENCOUNTER — Inpatient Hospital Stay (HOSPITAL_COMMUNITY): Payer: Medicare HMO

## 2017-07-07 DIAGNOSIS — E440 Moderate protein-calorie malnutrition: Secondary | ICD-10-CM

## 2017-07-07 DIAGNOSIS — J9601 Acute respiratory failure with hypoxia: Secondary | ICD-10-CM

## 2017-07-07 DIAGNOSIS — L8990 Pressure ulcer of unspecified site, unspecified stage: Secondary | ICD-10-CM

## 2017-07-07 LAB — BLOOD GAS, ARTERIAL
ACID-BASE EXCESS: 2.8 mmol/L — AB (ref 0.0–2.0)
Bicarbonate: 28.7 mmol/L — ABNORMAL HIGH (ref 20.0–28.0)
Drawn by: 252031
FIO2: 60
MECHVT: 480 mL
O2 SAT: 98.6 %
PATIENT TEMPERATURE: 98.6
PCO2 ART: 60.8 mmHg — AB (ref 32.0–48.0)
PEEP/CPAP: 14 cmH2O
PH ART: 7.296 — AB (ref 7.350–7.450)
RATE: 30 resp/min
pO2, Arterial: 144 mmHg — ABNORMAL HIGH (ref 83.0–108.0)

## 2017-07-07 LAB — GLUCOSE, CAPILLARY
GLUCOSE-CAPILLARY: 137 mg/dL — AB (ref 65–99)
GLUCOSE-CAPILLARY: 113 mg/dL — AB (ref 65–99)
GLUCOSE-CAPILLARY: 113 mg/dL — AB (ref 65–99)
GLUCOSE-CAPILLARY: 131 mg/dL — AB (ref 65–99)
Glucose-Capillary: 137 mg/dL — ABNORMAL HIGH (ref 65–99)
Glucose-Capillary: 138 mg/dL — ABNORMAL HIGH (ref 65–99)
Glucose-Capillary: 154 mg/dL — ABNORMAL HIGH (ref 65–99)

## 2017-07-07 LAB — POCT I-STAT 3, ART BLOOD GAS (G3+)
Acid-Base Excess: 4 mmol/L — ABNORMAL HIGH (ref 0.0–2.0)
Bicarbonate: 30.5 mmol/L — ABNORMAL HIGH (ref 20.0–28.0)
O2 SAT: 98 %
TCO2: 32 mmol/L (ref 22–32)
pCO2 arterial: 58.1 mmHg — ABNORMAL HIGH (ref 32.0–48.0)
pH, Arterial: 7.328 — ABNORMAL LOW (ref 7.350–7.450)
pO2, Arterial: 126 mmHg — ABNORMAL HIGH (ref 83.0–108.0)

## 2017-07-07 LAB — CBC
HEMATOCRIT: 28 % — AB (ref 39.0–52.0)
HEMOGLOBIN: 8.6 g/dL — AB (ref 13.0–17.0)
MCH: 28.5 pg (ref 26.0–34.0)
MCHC: 30.7 g/dL (ref 30.0–36.0)
MCV: 92.7 fL (ref 78.0–100.0)
Platelets: 278 10*3/uL (ref 150–400)
RBC: 3.02 MIL/uL — AB (ref 4.22–5.81)
RDW: 16.9 % — ABNORMAL HIGH (ref 11.5–15.5)
WBC: 10.1 10*3/uL (ref 4.0–10.5)

## 2017-07-07 LAB — CULTURE, BLOOD (ROUTINE X 2)
CULTURE: NO GROWTH
CULTURE: NO GROWTH
Special Requests: ADEQUATE
Special Requests: ADEQUATE

## 2017-07-07 LAB — BASIC METABOLIC PANEL
Anion gap: 10 (ref 5–15)
BUN: 45 mg/dL — AB (ref 6–20)
CHLORIDE: 113 mmol/L — AB (ref 101–111)
CO2: 26 mmol/L (ref 22–32)
Calcium: 7.1 mg/dL — ABNORMAL LOW (ref 8.9–10.3)
Creatinine, Ser: 2.14 mg/dL — ABNORMAL HIGH (ref 0.61–1.24)
GFR calc Af Amer: 35 mL/min — ABNORMAL LOW (ref >60)
GFR calc non Af Amer: 30 mL/min — ABNORMAL LOW (ref >60)
GLUCOSE: 146 mg/dL — AB (ref 65–99)
Potassium: 3.9 mmol/L (ref 3.5–5.1)
Sodium: 149 mmol/L — ABNORMAL HIGH (ref 135–145)

## 2017-07-07 LAB — MAGNESIUM: Magnesium: 2.6 mg/dL — ABNORMAL HIGH (ref 1.7–2.4)

## 2017-07-07 LAB — PHOSPHORUS: Phosphorus: 4.3 mg/dL (ref 2.5–4.6)

## 2017-07-07 LAB — VANCOMYCIN, TROUGH: Vancomycin Tr: 20 ug/mL (ref 15–20)

## 2017-07-07 LAB — PROCALCITONIN: Procalcitonin: 0.78 ng/mL

## 2017-07-07 MED ORDER — FUROSEMIDE 10 MG/ML IJ SOLN
40.0000 mg | Freq: Every day | INTRAMUSCULAR | Status: DC
  Administered 2017-07-07 – 2017-07-10 (×4): 40 mg via INTRAVENOUS
  Filled 2017-07-07 (×4): qty 4

## 2017-07-07 MED ORDER — VITAL HIGH PROTEIN PO LIQD
1000.0000 mL | ORAL | Status: DC
  Administered 2017-07-07: 1000 mL
  Administered 2017-07-08: 01:00:00

## 2017-07-07 MED ORDER — FREE WATER
200.0000 mL | Freq: Three times a day (TID) | Status: DC
  Administered 2017-07-07 – 2017-07-10 (×11): 200 mL

## 2017-07-07 NOTE — Progress Notes (Signed)
Pt flipped on back w/ no apparent complications.  Pt appeared to tol well, no distress noted.  Sat 96% now. No vent changes made currently.

## 2017-07-07 NOTE — Progress Notes (Signed)
1530 Bedside shift report. Pt paralyzed on vent, lines, gtts, tubes checked and verified. WCTM.   1600 Pt assessed see flow sheet.  1645 Pt turned to prone, tolerated but desaturated to 88%. Pt suctioned, O2 92%.  1800 Pt resting comfortably, NAD, WCTM.   1845 Pt still prone, saturating well, no new changes, awaiting night shift RN for report.

## 2017-07-07 NOTE — Progress Notes (Addendum)
PULMONARY / CRITICAL CARE MEDICINE   Name: Darryl George MRN: [id number] DOB: 1950-10-19    ADMISSION DATE:  07/06/2017 CONSULTATION DATE:  07/03/2017  REFERRING MD:  Dr. Elyse Jarvis Penn  CHIEF COMPLAINT:  hypoxia  HISTORY OF PRESENT ILLNESS:   67 year old male with past medical history as below, which is significant for COPD, hypertension, stroke, and tobacco abuse.  He was recently admitted for COPD exacerbation in which he was discharged (2/10) on 4-6 L of supplemental oxygen, azithromycin, and a steroid taper.  He then presented to [facility] emergency department on 2/13 with complaints of severe dyspnea.  He was found to hypoxemic with oxygen saturations in the 50s on a nonrebreather.  He was emergently intubated.  Also developed atrial fibrillation with rapid ventricular rate and was started on amiodarone.  Chest x-ray demonstrated severe diffuse infiltrates.  He immediately required high FiO2 and high PEEP in order to maintain adequate saturations.  He was started on broad-spectrum antibiotics and deeply sedated for vent synchrony.  He required levophed for blood pressure support.  He was transferred to [facility] for higher level ICU care in the setting of presumed ARDS.   SUBJECTIVE:  Tolerating prone positioning. Becomes dyssynchronous when sedation, paralytics are off.   VITAL SIGNS: BP (!) 105/59 (BP Location: Left Arm)   Pulse 92   Temp 99.9 F (37.7 C) (Bladder)   Resp (!) 30   Ht _0  (1.854 m)   Wt 161 lb 2.5 oz (73.1 kg)   SpO2 98%   BMI 21.26 kg/m   HEMODYNAMICS: CVP:  [8 mmHg-15 mmHg] 10 mmHg  VENTILATOR SETTINGS: Vent Mode: PRVC FiO2 (%):  [50 %-60 %] 50 % Set Rate:  [30 bmp] 30 bmp Vt Set:  [480 mL] 480 mL PEEP:  [14 cmH20] 14 cmH20 Plateau Pressure:  [25 cmH20-26 cmH20] 26 cmH20  INTAKE / OUTPUT: I/O last 3 completed shifts: In: 6219 [I.V.:4113.1; NG/GT:1825.8; IV Piggyback:280] Out: 4854 [Urine:3340]  PHYSICAL EXAMINATION: Blood pressure (!)  96/55, pulse 74, temperature 98.2 F (36.8 C), resp. rate (!) 27, height _1  (1.854 m), weight 160 lb 4.4 oz (72.7 kg), SpO2 97 %. Gen:      No acute distress HEENT:  EOMI, sclera anicteric, ETT tube in place Neck:     No masses; no thyromegaly Lungs:    Clear to auscultation bilaterally; normal respiratory effort CV:         Regular rate and rhythm; no murmurs Abd:      + bowel sounds; soft, non-tender; no palpable masses, no distension Ext:    No edema; adequate peripheral perfusion Skin:      Warm and dry; no rash Neuro: Sedated, paralyzed  LABS:  BMET Recent Labs  Lab 07/05/17 0350 07/06/17 0316 07/07/17 0334  NA 149* 148* 149*  K 4.0 3.7 3.9  CL 111 112* 113*  CO2 _2 BUN 43* 44* 45*  CREATININE 1.97* 1.98* 2.14*  GLUCOSE 115* 148* 146*   Electrolytes Recent Labs  Lab 07/05/17 0350 07/06/17 0316 07/07/17 0334  CALCIUM 7.1* 7.1* 7.1*  MG 2.5* 2.6* 2.6*  PHOS 3.8 3.7 4.3   CBC Recent Labs  Lab 07/05/17 0350 07/06/17 0316 07/07/17 0334  WBC 12.9* 9.9 10.1  HGB 9.3* 8.7* 8.6*  HCT 29.7* 28.0* 28.0*  PLT 262 261 278   Coag's No results for input(s): APTT, INR in the last 168 hours.  Sepsis Markers Recent Labs  Lab 07/05/17 6270 07/06/17 0316 07/07/17 0334  PROCALCITON 1.00 0.85 0.78   ABG Recent Labs  Lab 07/06/17 1017 07/07/17 0310 07/07/17 1115  PHART 7.278* 7.296* 7.328*  PCO2ART 60.2* 60.8* 58.1*  PO2ART 69.0* 144* 126.0*   Liver Enzymes No results for input(s): AST, ALT, ALKPHOS, BILITOT, ALBUMIN in the last 168 hours. Cardiac Enzymes No results for input(s): TROPONINI, PROBNP in the last 168 hours. Glucose Recent Labs  Lab 07/06/17 2123 07/07/17 0014 07/07/17 0312 07/07/17 0800 07/07/17 1145 07/07/17 1707  GLUCAP 137* 138* 137* 113* 113* 131*    Imaging Dg Chest Port 1 View  Result Date: 07/07/2017 CLINICAL DATA:  Vent EXAM: PORTABLE CHEST 1 VIEW COMPARISON:  07/06/2017 FINDINGS: Endotracheal tube terminates 7.5 cm  above the carina. Multifocal patchy opacities in the left upper lobe and bilateral lower lobes, suspicious for multifocal pneumonia, grossly unchanged. The heart is normal in size. Postsurgical changes related to prior CABG. Left subclavian IJ venous catheter terminates the cavoatrial junction. Enteric tube courses into the stomach. IMPRESSION: Stable multifocal pneumonia, left upper lobe predominant. Support apparatus as above. Electronically Signed   By: Julian Hy M.D.   On: 07/07/2017 11:18   STUDIES:  06/30/2017 2D echo> EF 50-55%, PA peak pressure of 52  CULTURES: BCx2 2/13 > Urine 2/13 > RVP 2/14 > negative Tracheal aspirate 2/15 > Mold, saprophyte BAL 2/18>   ANTIBIOTICS: Zosyn 2/13 > Vancomycin 2/13 > Eraxis 2/18>  SIGNIFICANT EVENTS: 2/14- Admit with ARDS, paralyzed for 24 hrs with improvement 2/17- Worsening oxygenation, dyssynchrony. paralyzed again.  2/18- Bronch, BAL, started proning 2/20- Weaning off nimbex, continue proning  LINES/TUBES: ETT 2/13 > Fem CVL 2/13 > 2/18 Lt IJ CVL 2/18>  06/29/2017 right radial A-line>>  DISCUSSION: 67 year old male with COPD recently admitted for exacerbation presented again 2/13 with profound hypoxia. Presumed ARDS. Intubated. Transferred to [facility] for ICU care.    ASSESSMENT / PLAN:  PULMONARY A: ARDS, multilobar pneumonia, flu negative COPD on home O2 Discussed with [facility] 2/19- Not a candidate for ECMO due to age, co morbidities and as he has stabilized on proning.  P:   Continue ARDS protocol Patient has been flipped back saturating well.  We will hold off any more proning at this time  Wean off paralytics Continue antibiotics for pneumonia. Follow BAL cultures  CARDIOVASCULAR A:  Shock: presumably septic shock, however, could be related to high PEEP and sedation needs.  Atrial fibrillation with RVR now sinus P:  Telemetry monitoring Amiodarone infusion stopped on 06/30/2017 for sinus bradycardia Levophed as  needed for low BP  RENAL A:   Elevated creatinine. Improving Hypernatremia Hypokalemia replete as needed Hypocalcemia replete as needed P:   Follow BUN/creatinine, urine output. Lasix previously held due to AKI.  Lasix will be changed to 40 mg daily due to hyponatremia. Free water flushes have been started through PEG tube. Follow CVP   GASTROINTESTINAL A:   No acute issues P:   Start tube feeds PPI   HEMATOLOGIC Recent Labs    07/06/17 0316 07/07/17 0334  HGB 8.7* 8.6*   A:   No acute issues  P:  SubQ heparin.  Follow CBC  INFECTIOUS A:   PNA vs respiratory infection.  Respiratory virus panel is negative Mold in sputum. ? pathogen  P:   Continue Vanco, Zosyn, eraxis. Will DC Eraxis.  We will continue Vanco and Zosyn for 14 days. Recheck Pct. Stop abx if BAL cultures are negative  ENDOCRINE CBG (last 3)  A:   No acute issues  P:  CBG monitoring SSI  NEUROLOGIC A:   Acute metabolic encephalopathy P:   RASS goal:  Versed, fentanyl. Start propofol Wean off nimbex  FAMILY  - Updates: Family updated daily. Palliative care meeting yesterday. No trach, peg Will work towards optimizing resp status before extubation.  - Inter-disciplinary family meet or Palliative Care meeting: Done  The patient is critically ill with multiple organ system failure and requires high complexity decision making for assessment and support, frequent evaluation and titration of therapies, advanced monitoring, review of radiographic studies and interpretation of complex data.   Critical Care Time devoted to patient care services, exclusive of separately billable procedures, described in this note is 35 minutes.   Rojelio Brenner Pulmonary Critical Care Pager: [phone]

## 2017-07-07 NOTE — Progress Notes (Signed)
Pharmacy Antibiotic Note  Darryl George is a 67 y.o. male ARDs and with PNA/fever.  Pharmacy has been consulted for vancomycin and zosyn dosing.He is on day 10 of antibiotics and also noted on eraxis (day 5). Cultures - ngtd -WBC= 10.1, tmax= 100, SCr= 2.14 and CrCl ~ 35 -vancomycin trough= 20  Plan: -No antibiotic dose changes needed -Consider defining length of therapy  Hildred Laser, Pharm D 07/07/2017 7:56 AM     Height: _0  (185.4 cm) Weight: 161 lb 2.5 oz (73.1 kg) IBW/kg (Calculated) : 79.9  Temp (24hrs), Avg:99.1 F (37.3 C), Min:98.2 F (36.8 C), Max:100 F (37.8 C)  Recent Labs  Lab 06/30/17 1800  07/03/17 0406 07/04/17 0407 07/05/17 0350 07/06/17 0316 07/07/17 0334  WBC  --    < > 17.0* 15.6* 12.9* 9.9 10.1  CREATININE  --    < > 2.31* 2.09* 1.97* 1.98* 2.14*  VANCOTROUGH 17  --   --   --   --   --  20   < > = values in this interval not displayed.    Estimated Creatinine Clearance: 34.2 mL/min (A) (by C-G formula based on SCr of 2.14 mg/dL (H)).    No Known Allergies  2/13 vanc> 2/13 zosyn> 2/18 eraxis>  2/15 VT = 17 - no changes 2/22 VT= 20  2/13 blood x2- ng 2/13 urine: neg 2/14 MRSA PCR- neg 2/14 - resp panel - neg 2/17 Bld x2 - ngtd 2/18 BAL-ng  Thank you for allowing pharmacy to be a part of this patient's care.  Darryl George 07/07/2017 7:52 AM

## 2017-07-08 ENCOUNTER — Inpatient Hospital Stay (HOSPITAL_COMMUNITY): Payer: Medicare HMO

## 2017-07-08 LAB — POCT I-STAT 3, ART BLOOD GAS (G3+)
ACID-BASE EXCESS: 1 mmol/L (ref 0.0–2.0)
ACID-BASE EXCESS: 2 mmol/L (ref 0.0–2.0)
Acid-Base Excess: 2 mmol/L (ref 0.0–2.0)
BICARBONATE: 29.9 mmol/L — AB (ref 20.0–28.0)
BICARBONATE: 29.3 mmol/L — AB (ref 20.0–28.0)
BICARBONATE: 28.5 mmol/L — AB (ref 20.0–28.0)
O2 SAT: 90 %
O2 Saturation: 91 %
O2 Saturation: 96 %
PCO2 ART: 56.4 mmHg — AB (ref 32.0–48.0)
PO2 ART: 80 mmHg — AB (ref 83.0–108.0)
Patient temperature: 38.9
TCO2: 32 mmol/L (ref 22–32)
TCO2: 31 mmol/L (ref 22–32)
TCO2: 30 mmol/L (ref 22–32)
pCO2 arterial: 83.8 mmHg (ref 32.0–48.0)
pCO2 arterial: 67 mmHg (ref 32.0–48.0)
pH, Arterial: 7.171 — CL (ref 7.350–7.450)
pH, Arterial: 7.258 — ABNORMAL LOW (ref 7.350–7.450)
pH, Arterial: 7.319 — ABNORMAL LOW (ref 7.350–7.450)
pO2, Arterial: 85 mmHg (ref 83.0–108.0)
pO2, Arterial: 100 mmHg (ref 83.0–108.0)

## 2017-07-08 LAB — COMPREHENSIVE METABOLIC PANEL
ALT: 48 U/L (ref 17–63)
AST: 40 U/L (ref 15–41)
Albumin: 1.2 g/dL — ABNORMAL LOW (ref 3.5–5.0)
Alkaline Phosphatase: 185 U/L — ABNORMAL HIGH (ref 38–126)
Anion gap: 10 (ref 5–15)
BUN: 48 mg/dL — ABNORMAL HIGH (ref 6–20)
CO2: 27 mmol/L (ref 22–32)
Calcium: 7.3 mg/dL — ABNORMAL LOW (ref 8.9–10.3)
Chloride: 111 mmol/L (ref 101–111)
Creatinine, Ser: 2.13 mg/dL — ABNORMAL HIGH (ref 0.61–1.24)
GFR calc Af Amer: 35 mL/min — ABNORMAL LOW (ref >60)
GFR calc non Af Amer: 30 mL/min — ABNORMAL LOW (ref >60)
Glucose, Bld: 127 mg/dL — ABNORMAL HIGH (ref 65–99)
Potassium: 3.9 mmol/L (ref 3.5–5.1)
Sodium: 148 mmol/L — ABNORMAL HIGH (ref 135–145)
Total Bilirubin: 0.5 mg/dL (ref 0.3–1.2)
Total Protein: 5.1 g/dL — ABNORMAL LOW (ref 6.5–8.1)

## 2017-07-08 LAB — GLUCOSE, CAPILLARY
GLUCOSE-CAPILLARY: 131 mg/dL — AB (ref 65–99)
GLUCOSE-CAPILLARY: 98 mg/dL (ref 65–99)
GLUCOSE-CAPILLARY: 147 mg/dL — AB (ref 65–99)
Glucose-Capillary: 116 mg/dL — ABNORMAL HIGH (ref 65–99)
Glucose-Capillary: 120 mg/dL — ABNORMAL HIGH (ref 65–99)
Glucose-Capillary: 137 mg/dL — ABNORMAL HIGH (ref 65–99)
Glucose-Capillary: 164 mg/dL — ABNORMAL HIGH (ref 65–99)
Glucose-Capillary: 93 mg/dL (ref 65–99)

## 2017-07-08 LAB — CBC
HCT: 27.3 % — ABNORMAL LOW (ref 39.0–52.0)
Hemoglobin: 8.2 g/dL — ABNORMAL LOW (ref 13.0–17.0)
MCH: 28 pg (ref 26.0–34.0)
MCHC: 30 g/dL (ref 30.0–36.0)
MCV: 93.2 fL (ref 78.0–100.0)
Platelets: 312 10*3/uL (ref 150–400)
RBC: 2.93 MIL/uL — ABNORMAL LOW (ref 4.22–5.81)
RDW: 16.9 % — ABNORMAL HIGH (ref 11.5–15.5)
WBC: 9.7 10*3/uL (ref 4.0–10.5)

## 2017-07-08 LAB — BLOOD GAS, ARTERIAL
ACID-BASE EXCESS: 4.7 mmol/L — AB (ref 0.0–2.0)
BICARBONATE: 30.2 mmol/L — AB (ref 20.0–28.0)
DRAWN BY: 511911
FIO2: 50
MECHVT: 480 mL
O2 Saturation: 98.6 %
PEEP/CPAP: 14 cmH2O
PO2 ART: 146 mmHg — AB (ref 83.0–108.0)
Patient temperature: 98.6
RATE: 30 resp/min
pCO2 arterial: 57.8 mmHg — ABNORMAL HIGH (ref 32.0–48.0)
pH, Arterial: 7.337 — ABNORMAL LOW (ref 7.350–7.450)

## 2017-07-08 LAB — MAGNESIUM: Magnesium: 2.4 mg/dL (ref 1.7–2.4)

## 2017-07-08 LAB — BRAIN NATRIURETIC PEPTIDE: B Natriuretic Peptide: 180.4 pg/mL — ABNORMAL HIGH (ref 0.0–100.0)

## 2017-07-08 LAB — PHOSPHORUS: Phosphorus: 4.4 mg/dL (ref 2.5–4.6)

## 2017-07-08 LAB — LACTATE DEHYDROGENASE: LDH: 199 U/L — ABNORMAL HIGH (ref 98–192)

## 2017-07-08 MED ORDER — PRO-STAT SUGAR FREE PO LIQD
30.0000 mL | Freq: Four times a day (QID) | ORAL | Status: DC
  Administered 2017-07-08 – 2017-07-10 (×9): 30 mL via ORAL
  Filled 2017-07-08 (×8): qty 30

## 2017-07-08 MED ORDER — METOPROLOL TARTRATE 5 MG/5ML IV SOLN
5.0000 mg | INTRAVENOUS | Status: DC | PRN
  Administered 2017-07-08: 5 mg via INTRAVENOUS

## 2017-07-08 MED ORDER — POLYETHYLENE GLYCOL 3350 17 G PO PACK
17.0000 g | PACK | Freq: Every day | ORAL | Status: DC
  Administered 2017-07-08 – 2017-07-09 (×2): 17 g via ORAL
  Filled 2017-07-08 (×2): qty 1

## 2017-07-08 MED ORDER — METOPROLOL TARTRATE 5 MG/5ML IV SOLN
5.0000 mg | INTRAVENOUS | Status: DC | PRN
  Filled 2017-07-08: qty 5

## 2017-07-08 MED ORDER — METOPROLOL TARTRATE 5 MG/5ML IV SOLN
5.0000 mg | Freq: Once | INTRAVENOUS | Status: AC
  Administered 2017-07-08: 5 mg via INTRAVENOUS

## 2017-07-08 MED ORDER — METOPROLOL TARTRATE 5 MG/5ML IV SOLN: 5.0000 mg | INTRAVENOUS | Status: DC | PRN

## 2017-07-08 MED ORDER — VITAL 1.5 CAL PO LIQD
1000.0000 mL | ORAL | Status: DC
  Administered 2017-07-08 – 2017-07-10 (×3): 1000 mL
  Filled 2017-07-08 (×6): qty 1000

## 2017-07-08 MED ORDER — DEXMEDETOMIDINE HCL IN NACL 200 MCG/50ML IV SOLN
0.4000 ug/kg/h | INTRAVENOUS | Status: DC
  Administered 2017-07-08: 1 ug/kg/h via INTRAVENOUS
  Filled 2017-07-08: qty 50

## 2017-07-08 MED ORDER — METOPROLOL TARTRATE 5 MG/5ML IV SOLN
INTRAVENOUS | Status: AC
  Filled 2017-07-08: qty 5

## 2017-07-08 MED ORDER — ACETAMINOPHEN 10 MG/ML IV SOLN
1000.0000 mg | Freq: Once | INTRAVENOUS | Status: AC
  Administered 2017-07-08: 1000 mg via INTRAVENOUS
  Filled 2017-07-08: qty 100

## 2017-07-08 NOTE — Progress Notes (Signed)
Nutrition Follow-up  DOCUMENTATION CODES:   Non-severe (moderate) malnutrition in context of acute illness/injury  INTERVENTION:  - Will adjust TF: Vital 1.5 @ 45 mL/hr with 30 mL Prostat QID. This regimen will provide 2020 kcal, 133 grams of protein, 6.5 grams of fiber, and 825 mL free water.  - Free water flush to continue to be per MD.   NUTRITION DIAGNOSIS:   Moderate Malnutrition related to acute illness(ARDS, COPD exacerbation, recent lung infection) as evidenced by energy intake < 75% for > 7 days, mild muscle depletion, edema. -ongoing  GOAL:   Patient will meet greater than or equal to 90% of their needs -unmet with current TF regimen   MONITOR:   Vent status, TF tolerance, Weight trends, Labs, Skin  REASON FOR ASSESSMENT:   Consult Enteral/tube feeding initiation and management  ASSESSMENT:   67 year old male with COPD recently admitted for exacerbation presented again 2/13 with profound hypoxia. PResumed ARDS. Intubated. Transferred to [facility] for ICU care. Deeply sedated and neuromuscular blockade.   Weight has been mainly stable over the past 3 days. Pt intubated, chemically paralyzed, sedated, and Cortrak in place. No family/visitors present. He is currently receiving Vital High Protein @ 40 mL/hr and orders in place for 30 mL Prostat TID and 200 mL free water TID. This regimen provides a total of 1260 kcal, 129 grams of protein, and 1402 mL free water.   Per Dr. Maisie Fus' note this AM: continue ARDS protocol, pt is not a candidate for ECMO d/t age and comorbidities, laid prone yesterday ~5PM and plan to flip back to supine today ~10AM, slowly wean Nimbex today, family does not want long-term trach, no BM since admission (10 days) with plan for Miralax.  Patient is currently intubated on ventilator support MV: 14 L/min Temp (24hrs), Avg:99 F (37.2 C), Min:97.5 F (36.4 C), Max:99.9 F (37.7 C) Propofol: none BP: 119/71 and MAP: 85  Medications reviewed; 40 mg IV  Lasix/day, sliding scale Novolog, 5 units Levemir BID, 1 packet Miralax/day. Labs reviewed; CBGs: 120, 131, and 98 mg/dL this AM, Na: 141 mmol/L, BUN: 48 mg/dL, creatinine: 0.30 mg/dL, Ca: 7.3 mg/dL, GFR: 30 mL/min.   Drips: Fentanyl @ 250 mcg/hr, Versed @ 6 mg/hr, Nimbex @ 5 mcg/kg/min.      Diet Order:  No diet orders on file  EDUCATION NEEDS:   Education needs have been addressed  Skin:  Skin Assessment: Skin Integrity Issues: Skin Integrity Issues:: DTI DTI: sacrum  Last BM:  no documented BM  Height:   Ht Readings from Last 1 Encounters:  06/29/17 6\' 1"  (1.854 m)    Weight:   Wt Readings from Last 1 Encounters:  07/08/17 165 lb 9.1 oz (75.1 kg)    Ideal Body Weight:  83.6 kg  BMI:  Body mass index is 21.84 kg/m.  Estimated Nutritional Needs:   Kcal:  2034  Protein:  125-166 g  Fluid:  > 2 L      Trenton Gammon, MS, RD, LDN, [facility] Inpatient Clinical Dietitian Pager # [phone] After hours/weekend pager # [phone]

## 2017-07-08 NOTE — Progress Notes (Signed)
Paralytic off per MD order. Patient asynchronous with the ventilator. HR 130-140s. WOB increased but sats 93% on 0.60% FIO2. Dr. Maisie Fus called and updated. New order given. Will implement and continue to monitor.

## 2017-07-08 NOTE — Progress Notes (Signed)
Tube holder changed. Now at 25 at the right lip. SAT 98%.

## 2017-07-08 NOTE — Progress Notes (Signed)
PULMONARY / CRITICAL CARE MEDICINE   Name: Darryl George MRN: [id number] DOB: 12/26/1950    ADMISSION DATE:  07/06/2017 CONSULTATION DATE:  07/06/2017  REFERRING MD:  Dr. Elyse Jarvis Penn  CHIEF COMPLAINT:  hypoxia  HISTORY OF PRESENT ILLNESS:   67 year old male with past medical history as below, which is significant for COPD, hypertension, stroke, and tobacco abuse.  He was recently admitted for COPD exacerbation in which he was discharged (2/10) on 4-6 L of supplemental oxygen, azithromycin, and a steroid taper.  He then presented to [facility] emergency department on 2/13 with complaints of severe dyspnea.  He was found to hypoxemic with oxygen saturations in the 50s on a nonrebreather.  He was emergently intubated.  Also developed atrial fibrillation with rapid ventricular rate and was started on amiodarone.  Chest x-ray demonstrated severe diffuse infiltrates.  He immediately required high FiO2 and high PEEP in order to maintain adequate saturations.  He was started on broad-spectrum antibiotics and deeply sedated for vent synchrony.  He required levophed for blood pressure support.  He was transferred to [facility] for higher level ICU care in the setting of presumed ARDS.   SUBJECTIVE:  Tolerating prone positioning.   VITAL SIGNS: BP 118/70   Pulse 97   Temp (!) 97.5 F (36.4 C)   Resp (!) 30   Ht _0  (1.854 m)   Wt 165 lb 9.1 oz (75.1 kg)   SpO2 100%   BMI 21.84 kg/m   HEMODYNAMICS: CVP:  [10 mmHg-16 mmHg] 15 mmHg  VENTILATOR SETTINGS: Vent Mode: PRVC FiO2 (%):  [50 %-60 %] 50 % Set Rate:  [30 bmp] 30 bmp Vt Set:  [480 mL] 480 mL PEEP:  [14 cmH20] 14 cmH20 Plateau Pressure:  [24 cmH20-26 cmH20] 25 cmH20  INTAKE / OUTPUT: I/O last 3 completed shifts: In: 7057.9 [I.V.:3625.6; NG/GT:2932.3; IV Piggyback:500] Out: 3500 [Urine:3500]  PHYSICAL EXAMINATION: Blood pressure (!) 96/55, pulse 74, temperature 98.2 F (36.8 C), resp. rate (!) 27, height _1  (1.854 m),  weight 160 lb 4.4 oz (72.7 kg), SpO2 97 %. Gen:      No acute distress HEENT:  EOMI, sclera anicteric, ETT tube in place Neck:     No masses; no thyromegaly Lungs:    Clear to auscultation bilaterally; normal respiratory effort CV:         Regular rate and rhythm; no murmurs Abd:      + bowel sounds; soft, non-tender; no palpable masses, no distension Ext:    No edema; adequate peripheral perfusion Skin:      Warm and dry; no rash Neuro: Sedated, paralyzed  LABS:  BMET Recent Labs  Lab 07/06/17 0316 07/07/17 0334 07/08/17 0241  NA 148* 149* 148*  K 3.7 3.9 3.9  CL 112* 113* 111  CO2 _2 BUN 44* 45* 48*  CREATININE 1.98* 2.14* 2.13*  GLUCOSE 148* 146* 127*   Electrolytes Recent Labs  Lab 07/06/17 0316 07/07/17 0334 07/08/17 0241  CALCIUM 7.1* 7.1* 7.3*  MG 2.6* 2.6* 2.4  PHOS 3.7 4.3 4.4   CBC Recent Labs  Lab 07/06/17 0316 07/07/17 0334 07/08/17 0241  WBC 9.9 10.1 9.7  HGB 8.7* 8.6* 8.2*  HCT 28.0* 28.0* 27.3*  PLT 261 278 312   Coag's No results for input(s): APTT, INR in the last 168 hours.  Sepsis Markers Recent Labs  Lab 07/05/17 0903 07/06/17 0316 07/07/17 0334  PROCALCITON 1.00 0.85 0.78   ABG Recent Labs  Lab  07/07/17 0310 07/07/17 1115 07/08/17 0401  PHART 7.296* 7.328* 7.337*  PCO2ART 60.8* 58.1* 57.8*  PO2ART 144* 126.0* 146*   Liver Enzymes Recent Labs  Lab 07/08/17 0241  AST 40  ALT 48  ALKPHOS 185*  BILITOT 0.5  ALBUMIN 1.2*   Cardiac Enzymes No results for input(s): TROPONINI, PROBNP in the last 168 hours. Glucose Recent Labs  Lab 07/07/17 1145 07/07/17 1707 07/07/17 2038 07/08/17 0057 07/08/17 0341 07/08/17 0815  GLUCAP 113* 131* 116* 120* 131* 98    Imaging Dg Chest Port 1 View  Result Date: 07/07/2017 CLINICAL DATA:  Vent EXAM: PORTABLE CHEST 1 VIEW COMPARISON:  07/06/2017 FINDINGS: Endotracheal tube terminates 7.5 cm above the carina. Multifocal patchy opacities in the left upper lobe and bilateral  lower lobes, suspicious for multifocal pneumonia, grossly unchanged. The heart is normal in size. Postsurgical changes related to prior CABG. Left subclavian IJ venous catheter terminates the cavoatrial junction. Enteric tube courses into the stomach. IMPRESSION: Stable multifocal pneumonia, left upper lobe predominant. Support apparatus as above. Electronically Signed   By: Julian Hy M.D.   On: 07/07/2017 11:18   STUDIES:  06/30/2017 2D echo> EF 50-55%, PA peak pressure of 52  CULTURES: BCx2 2/13 > Urine 2/13 > RVP 2/14 > negative Tracheal aspirate 2/15 > Mold, saprophyte BAL 2/18>   ANTIBIOTICS: Zosyn 2/13 > Vancomycin 2/13 > Eraxis 2/18> 07/07/2017  SIGNIFICANT EVENTS: 2/14- Admit with ARDS, paralyzed for 24 hrs with improvement 2/17- Worsening oxygenation, dyssynchrony. paralyzed again.  2/18- Bronch, BAL, started proning 2/20- Weaning off nimbex, continue proning  LINES/TUBES: ETT 2/13 > Fem CVL 2/13 > 2/18 Lt IJ CVL 2/18>  06/29/2017 right radial A-line>>  DISCUSSION: 67 year old male with COPD recently admitted for exacerbation presented again 2/13 with profound hypoxia.  ARDS. Prone positioning x multiple times    ASSESSMENT / PLAN:  PULMONARY A: ARDS, multilobar pneumonia, flu negative COPD on home O2 Discussed with [facility] 2/19- Not a candidate for ECMO due to age, co morbidities and as he has stabilized on proning.  P:   Continue ARDS protocol Patient was placed in prone position yesterday around 5 PM.  Patient is currently saturating 100%.  Patient will be flipped back today at 10 AM.  If patient is doing well will DC prone positioning. Our goal for today should be to slowly wean off Nimbex. Family does not want long-term tracheostomy.  We will try to optimize after status as best as possible and see if we can extubate the patient. Continue antibiotics for pneumonia. Follow BAL cultures   CARDIOVASCULAR A:  Shock: presumably septic shock, however, could  be related to high PEEP and sedation needs.  Atrial fibrillation with RVR now sinus P:  Telemetry monitoring Amiodarone infusion stopped on 06/30/2017 for sinus bradycardia Levophed as needed for low BP.  Patient blood pressures been stable off pressors. Recent echo shows normal LV with EF of 50-55%.  No diastolic dysfunction.  Elevated PA pressures of 52.  RENAL A:   Elevated creatinine. I stable Hypernatremia Hypokalemia replete as needed Hypocalcemia replete as needed P:   Follow BUN/creatinine, urine output. Lasix previously held due to AKI.  Lasix will be changed to 40 mg daily due to hypernatremia Follow CVP Hypernatremia slowly improving.  Continue with free water flushes through PEG tube.   GASTROINTESTINAL A:   No acute issues P:   Continue tube feeds PPI Patient has not had a bowel movement since admission.  MiraLAX for PEG tube.  HEMATOLOGIC Recent Labs    07/07/17 0334 07/08/17 0241  HGB 8.6* 8.2*   A:   No acute issues  P:  SubQ heparin.  Follow CBC Patient's hemoglobin has been slowly trending down.  Could be due to anemia of chronic disease.  We will continue to follow.  No signs of active bleeding at this time.  INFECTIOUS A:   PNA vs respiratory infection.  Respiratory virus panel is negative Mold in sputum. ? pathogen  P:   Continue Vanco, Zosyn, Patient was on Eraxis for 5 days.  Eraxis stopped on 07/07/2017.   We will continue Vanco and Zosyn for 14 days. Recheck Pct.   ENDOCRINE CBG (last 3)  A:   No acute issues  P:   CBG monitoring SSI  NEUROLOGIC A:   Acute metabolic encephalopathy P:   RASS goal:  Patient currently on Versed and fentanyl for sedation. Wean off nimbex over the next 12 hours.  Patient will need a CT head eventually for further neuro prognostication.  FAMILY  - Updates: Family updated daily.  Family does not want the patient to have a trach or PEG.   Will work towards optimizing resp status before  extubation.  - Inter-disciplinary family meet or Palliative Care meeting: Done  The patient is critically ill with multiple organ system failure and requires high complexity decision making for assessment and support, frequent evaluation and titration of therapies, advanced monitoring, review of radiographic studies and interpretation of complex data.   Critical Care Time devoted to patient care services, exclusive of separately billable procedures, described in this note is 35 minutes.   Rojelio Brenner Pulmonary Critical Care Pager: [phone]

## 2017-07-08 NOTE — Progress Notes (Signed)
Pt rolled onto back.  Pt has notable facial edema currently.  Pt appeared tol tolerate supine position currently. Sat 96%.

## 2017-07-08 NOTE — Progress Notes (Signed)
ABG drawn. Critical results called back to Dr. Maisie Fus. Order to increase tidal volume to 550. Sarah RT called and updated. Changes made to ventilator.

## 2017-07-08 NOTE — Progress Notes (Signed)
Pt sat 90% on 50% fio2.  Recruitment maneuver performed, and fio2 increased to 60%.  RN aware.  MD at bedside now and aware.

## 2017-07-08 NOTE — Progress Notes (Signed)
PCCM Interval Note  Spoke with patient family (Sister Jolaine Artist, whom confirms that all family members are on the same page), palliative care meeting scheduled for Monday, plans for possible one way extubation. Updated on patient current condition. Understand severity of condition. If patient were to arrest would not want [place] to preform CPR/Defib/ACLS medications, they state that they do not wish for the patient to suffer. Will continue current therapies until further decisions to be made on Monday.   Jovita Kussmaul, AGACNP-BC [facility]  Pgr: [phone]  PCCM Pgr: [phone]

## 2017-07-08 NOTE — Progress Notes (Signed)
Patient continues to be asynchronous with the ventilator despite interventions. Dr. Maisie Fus called. New order to restart paralytic. Will implement and continue to monitor.

## 2017-07-09 ENCOUNTER — Inpatient Hospital Stay (HOSPITAL_COMMUNITY): Payer: Medicare HMO

## 2017-07-09 LAB — POCT I-STAT 3, ART BLOOD GAS (G3+)
Acid-Base Excess: 3 mmol/L — ABNORMAL HIGH (ref 0.0–2.0)
BICARBONATE: 28.9 mmol/L — AB (ref 20.0–28.0)
O2 Saturation: 92 %
PCO2 ART: 51.7 mmHg — AB (ref 32.0–48.0)
PH ART: 7.358 (ref 7.350–7.450)
PO2 ART: 70 mmHg — AB (ref 83.0–108.0)
Patient temperature: 37.4
TCO2: 30 mmol/L (ref 22–32)

## 2017-07-09 LAB — COMPREHENSIVE METABOLIC PANEL
ALT: 56 U/L (ref 17–63)
AST: 54 U/L — ABNORMAL HIGH (ref 15–41)
Albumin: 1.2 g/dL — ABNORMAL LOW (ref 3.5–5.0)
Alkaline Phosphatase: 317 U/L — ABNORMAL HIGH (ref 38–126)
Anion gap: 10 (ref 5–15)
BUN: 60 mg/dL — ABNORMAL HIGH (ref 6–20)
CO2: 25 mmol/L (ref 22–32)
Calcium: 7.4 mg/dL — ABNORMAL LOW (ref 8.9–10.3)
Chloride: 111 mmol/L (ref 101–111)
Creatinine, Ser: 2.43 mg/dL — ABNORMAL HIGH (ref 0.61–1.24)
GFR calc Af Amer: 30 mL/min — ABNORMAL LOW (ref >60)
GFR calc non Af Amer: 26 mL/min — ABNORMAL LOW (ref >60)
Glucose, Bld: 143 mg/dL — ABNORMAL HIGH (ref 65–99)
Potassium: 3.7 mmol/L (ref 3.5–5.1)
Sodium: 146 mmol/L — ABNORMAL HIGH (ref 135–145)
Total Bilirubin: 0.8 mg/dL (ref 0.3–1.2)
Total Protein: 5.1 g/dL — ABNORMAL LOW (ref 6.5–8.1)

## 2017-07-09 LAB — MAGNESIUM: Magnesium: 2.7 mg/dL — ABNORMAL HIGH (ref 1.7–2.4)

## 2017-07-09 LAB — GLUCOSE, CAPILLARY
GLUCOSE-CAPILLARY: 139 mg/dL — AB (ref 65–99)
GLUCOSE-CAPILLARY: 140 mg/dL — AB (ref 65–99)
GLUCOSE-CAPILLARY: 127 mg/dL — AB (ref 65–99)
Glucose-Capillary: 119 mg/dL — ABNORMAL HIGH (ref 65–99)
Glucose-Capillary: 109 mg/dL — ABNORMAL HIGH (ref 65–99)
Glucose-Capillary: 113 mg/dL — ABNORMAL HIGH (ref 65–99)

## 2017-07-09 LAB — CBC
HCT: 27.1 % — ABNORMAL LOW (ref 39.0–52.0)
Hemoglobin: 8.2 g/dL — ABNORMAL LOW (ref 13.0–17.0)
MCH: 28.1 pg (ref 26.0–34.0)
MCHC: 30.3 g/dL (ref 30.0–36.0)
MCV: 92.8 fL (ref 78.0–100.0)
Platelets: 344 10*3/uL (ref 150–400)
RBC: 2.92 MIL/uL — ABNORMAL LOW (ref 4.22–5.81)
RDW: 17.2 % — ABNORMAL HIGH (ref 11.5–15.5)
WBC: 10.2 10*3/uL (ref 4.0–10.5)

## 2017-07-09 LAB — PHOSPHORUS: Phosphorus: 4 mg/dL (ref 2.5–4.6)

## 2017-07-09 MED ORDER — SODIUM CHLORIDE 0.9 % IV SOLN
3.0000 ug/kg/min | INTRAVENOUS | Status: DC
  Administered 2017-07-09 – 2017-07-10 (×5): 8 ug/kg/min via INTRAVENOUS
  Filled 2017-07-09 (×7): qty 20

## 2017-07-09 MED ORDER — POTASSIUM CHLORIDE 10 MEQ/50ML IV SOLN
10.0000 meq | Freq: Once | INTRAVENOUS | Status: AC
  Administered 2017-07-09: 10 meq via INTRAVENOUS
  Filled 2017-07-09: qty 50

## 2017-07-09 NOTE — Plan of Care (Signed)
Patient has had bowel movement. Pain under control with current regiment. Adequate urinary output.

## 2017-07-09 NOTE — Progress Notes (Signed)
PULMONARY / CRITICAL CARE MEDICINE   Name: Darryl George MRN: [id number] DOB: Jul 11, 1950    ADMISSION DATE:  06/29/2017 CONSULTATION DATE:  06/27/2017  REFERRING MD:  Dr. Elyse Jarvis Penn  CHIEF COMPLAINT:  hypoxia  HISTORY OF PRESENT ILLNESS:   67 year old male with past medical history as below, which is significant for COPD, hypertension, stroke, and tobacco abuse.  He was recently admitted for COPD exacerbation in which he was discharged (2/10) on 4-6 L of supplemental oxygen, azithromycin, and a steroid taper.  He then presented to [facility] emergency department on 2/13 with complaints of severe dyspnea.  He was found to hypoxemic with oxygen saturations in the 50s on a nonrebreather.  He was emergently intubated.  Also developed atrial fibrillation with rapid ventricular rate and was started on amiodarone.  Chest x-ray demonstrated severe diffuse infiltrates.  He immediately required high FiO2 and high PEEP in order to maintain adequate saturations.  He was started on broad-spectrum antibiotics and deeply sedated for vent synchrony.  He required levophed for blood pressure support.  He was transferred to [facility] for higher level ICU care in the setting of presumed ARDS.   SUBJECTIVE:  Yesterday family discussed with our team that they would like him to be DNR.  Awaiting family meeting with palliative tomorrow.  Attempted to wean nimbex but developed respiratory distress and it was restarted. No there significant events  VITAL SIGNS: BP 117/62   Pulse (!) 112   Temp 98.4 F (36.9 C)   Resp (!) 30   Ht _0  (1.854 m)   Wt 179 lb 7.3 oz (81.4 kg)   SpO2 92%   BMI 23.68 kg/m   HEMODYNAMICS: CVP:  [9 mmHg-17 mmHg] 9 mmHg  VENTILATOR SETTINGS: Vent Mode: PRVC FiO2 (%):  [50 %-60 %] 50 % Set Rate:  [30 bmp] 30 bmp Vt Set:  [480 mL-550 mL] 550 mL PEEP:  [14 cmH20] 14 cmH20 Plateau Pressure:  [24 cmH20-30 cmH20] 28 cmH20  INTAKE / OUTPUT: I/O last 3 completed shifts: In:  5899.5 [I.V.:2312.5; NG/GT:2987; IV BPZWCHENI:778] Out: 2810 [Urine:2810]  PHYSICAL EXAMINATION: Blood pressure (!) 96/55, pulse 74, temperature 98.2 F (36.8 C), resp. rate (!) 27, height _1  (1.854 m), weight 160 lb 4.4 oz (72.7 kg), SpO2 97 %. Gen:      No acute distress, paralyzed and sedated HEENT:  sclera anicteric, ETT tube in place Neck:     No masses; no thyromegaly Lungs:    Clear to auscultation bilaterally; normal respiratory effort CV:         Regular rate and rhythm; no murmurs Abd:      soft, non-tender; no palpable masses, no distension Ext:    No edema; adequate peripheral perfusion Skin:      Warm and dry; no rash Neuro: Sedated, paralyzed  LABS:  BMET Recent Labs  Lab 07/07/17 0334 07/08/17 0241 07/09/17 0302  NA 149* 148* 146*  K 3.9 3.9 3.7  CL 113* 111 111  CO2 _2 BUN 45* 48* 60*  CREATININE 2.14* 2.13* 2.43*  GLUCOSE 146* 127* 143*   Electrolytes Recent Labs  Lab 07/07/17 0334 07/08/17 0241 07/09/17 0302  CALCIUM 7.1* 7.3* 7.4*  MG 2.6* 2.4 2.7*  PHOS 4.3 4.4 4.0   CBC Recent Labs  Lab 07/07/17 0334 07/08/17 0241 07/09/17 0302  WBC 10.1 9.7 10.2  HGB 8.6* 8.2* 8.2*  HCT 28.0* 27.3* 27.1*  PLT 278 312 344   Coag's No results  for input(s): APTT, INR in the last 168 hours.  Sepsis Markers Recent Labs  Lab 07/05/17 0903 07/06/17 0316 07/07/17 0334  PROCALCITON 1.00 0.85 0.78   ABG Recent Labs  Lab 07/08/17 1951 07/08/17 2214 07/09/17 0337  PHART 7.258* 7.319* 7.358  PCO2ART 67.0* 56.4* 51.7*  PO2ART 80.0* 100.0 70.0*   Liver Enzymes Recent Labs  Lab 07/08/17 0241 07/09/17 0302  AST 40 54*  ALT 48 56  ALKPHOS 185* 317*  BILITOT 0.5 0.8  ALBUMIN 1.2* 1.2*   Cardiac Enzymes No results for input(s): TROPONINI, PROBNP in the last 168 hours. Glucose Recent Labs  Lab 07/08/17 1147 07/08/17 1604 07/08/17 1949 07/08/17 2340 07/09/17 0334 07/09/17 0852  GLUCAP 93 147* 164* 137* 139* 119*     Imaging Dg Chest Port 1 View  Result Date: 07/09/2017 CLINICAL DATA:  Cardiac arrest.  Intubation. EXAM: PORTABLE CHEST 1 VIEW COMPARISON:  July 08, 2017 FINDINGS: The ETT, left central line, and feeding tube are in stable position within visualize limits. The feeding tube does terminate below today's film however. No pneumothorax. Focal infiltrates in the right base and left mid lung are similar in the interval. No change in the cardiomediastinal silhouette. Probable persistent opacity in the left retrocardiac region. IMPRESSION: 1. Stable support apparatus within visualize limits. 2. Stable multifocal infiltrates. Electronically Signed   By: Dorise Bullion III M.D   On: 07/09/2017 07:03   Dg Chest Port 1 View  Result Date: 07/08/2017 CLINICAL DATA:  Cardiac arrest, intubated EXAM: PORTABLE CHEST 1 VIEW COMPARISON:  Chest radiograph from one day prior. FINDINGS: Endotracheal tube tip is 9.7 cm above the carina. Enteric tube enters stomach with the tip not seen on this image. Left internal jugular central venous catheter terminates in middle third of the superior vena cava. Stable cardiomediastinal silhouette with normal heart size. No pneumothorax. No pleural effusion. Extensive patchy airspace opacities throughout the left lung and at the right lung base, not appreciably changed. IMPRESSION: 1. Endotracheal tube tip is 9.7 cm above the carina, consider advancing 2-3 cm. 2. No change in extensive patchy bilateral airspace opacities, which could represent asymmetric pulmonary edema, aspiration and/or pneumonia. Electronically Signed   By: Ilona Sorrel M.D.   On: 07/08/2017 14:06   STUDIES:  06/30/2017 2D echo> EF 50-55%, PA peak pressure of 52  CULTURES: BCx2 2/13 > Urine 2/13 > RVP 2/14 > negative Tracheal aspirate 2/15 > Mold, saprophyte BAL 2/18>   ANTIBIOTICS: Zosyn 2/13 > Vancomycin 2/13 > Eraxis 2/18> 07/07/2017  SIGNIFICANT EVENTS: 2/14- Admit with ARDS, paralyzed for 24 hrs  with improvement 2/17- Worsening oxygenation, dyssynchrony. paralyzed again.  2/18- Bronch, BAL, started proning 2/20- Weaning off nimbex, continue proning  LINES/TUBES: ETT 2/13 > Fem CVL 2/13 > 2/18 Lt IJ CVL 2/18>  06/29/2017 right radial A-line>>  DISCUSSION: 67 year old male with COPD recently admitted for exacerbation presented again 2/13 with profound hypoxia.  ARDS. Prone positioning x multiple times    ASSESSMENT / PLAN:  PULMONARY A: ARDS, multilobar pneumonia, flu negative COPD on home O2 Discussed with [facility] 2/19- Not a candidate for ECMO due to age, co morbidities and as he has stabilized on proning.  P:   Continue ARDS protocol Patient proned multiples times, now supine Our goal for today should be to slowly wean off Nimbex however he did not tolerate it 2/23 Family does not want long-term tracheostomy. Continue antibiotics for pneumonia. Follow BAL cultures - unrevealing thus far Overall not improving respiratory status. Discussed at  bedside with his eldest sister who believes he would want comfort care though his daughter and longterm partner are not sure.  I encouraged all of them to be present with the palliative care meeting scheduled 2/25   CARDIOVASCULAR A:  Shock: presumably septic shock, however, could be related to high PEEP and sedation needs.  Atrial fibrillation with RVR now sinus P:  Telemetry monitoring Amiodarone infusion stopped on 06/30/2017 for sinus bradycardia Levophed as needed for low BP.  Patient blood pressures been stable off pressors. Recent echo shows normal LV with EF of 50-55%.  No diastolic dysfunction.  Elevated PA pressures of 52.  RENAL A:   Elevated creatinine. I stable Hypernatremia Hypokalemia replete as needed Hypocalcemia replete as needed P:   Follow BUN/creatinine, urine output. Lasix previously held due to AKI.  Lasix changed to 40 mg daily due to hypernatremia Follow CVP Hypernatremia improving.  Continue with  free water flushes through PEG tube.   GASTROINTESTINAL A:   No acute issues P:   Continue tube feeds PPI Patient has not had a bowel movement since admission.  MiraLAX for PEG tube.   HEMATOLOGIC Recent Labs    07/08/17 0241 07/09/17 0302  HGB 8.2* 8.2*   A:   No acute issues  P:  SubQ heparin.  Follow CBC Patient's hemoglobin has been slowly trending down.  Could be due to anemia of chronic disease.  We will continue to follow.  No signs of active bleeding at this time.  INFECTIOUS A:   PNA vs respiratory infection.  Respiratory virus panel is negative Mold in sputum. ? pathogen  P:   Continue Vanco, Zosyn, Patient was on Eraxis for 5 days.  Eraxis stopped on 07/07/2017.   We will continue Vanco and Zosyn for 14 days. Recheck Pct.   ENDOCRINE CBG (last 3)  A:   No acute issues  P:   CBG monitoring SSI  NEUROLOGIC A:   Acute metabolic encephalopathy P:   RASS goal:  Patient currently on Versed and fentanyl for sedation. Continue nimbex as he did not tolerate weaning  FAMILY  - Updates: Family updated daily.  Family does not want the patient to have a trach or PEG.  Made DNR 2/23. Will work towards optimizing resp status before extubation.  - Inter-disciplinary family meet or Palliative Care meeting: Done  The patient is critically ill with multiple organ system failure and requires high complexity decision making for assessment and support, frequent evaluation and titration of therapies, advanced monitoring, review of radiographic studies and interpretation of complex data.   Critical Care Time devoted to patient care services, exclusive of separately billable procedures, described in this note is 25 minutes.   Mali Delainee Tramel, MD [facility] Pulmonology/Critical Care Pager [phone] After hours pager: [phone]

## 2017-07-10 DIAGNOSIS — J449 Chronic obstructive pulmonary disease, unspecified: Secondary | ICD-10-CM

## 2017-07-10 DIAGNOSIS — R0600 Dyspnea, unspecified: Secondary | ICD-10-CM

## 2017-07-10 DIAGNOSIS — J9621 Acute and chronic respiratory failure with hypoxia: Secondary | ICD-10-CM

## 2017-07-10 LAB — CBC
HEMATOCRIT: 26.1 % — AB (ref 39.0–52.0)
Hemoglobin: 8.1 g/dL — ABNORMAL LOW (ref 13.0–17.0)
MCH: 28.6 pg (ref 26.0–34.0)
MCHC: 31 g/dL (ref 30.0–36.0)
MCV: 92.2 fL (ref 78.0–100.0)
PLATELETS: 345 10*3/uL (ref 150–400)
RBC: 2.83 MIL/uL — AB (ref 4.22–5.81)
RDW: 17.3 % — AB (ref 11.5–15.5)
WBC: 8.2 10*3/uL (ref 4.0–10.5)

## 2017-07-10 LAB — PHOSPHORUS: PHOSPHORUS: 4.6 mg/dL (ref 2.5–4.6)

## 2017-07-10 LAB — BASIC METABOLIC PANEL
ANION GAP: 11 (ref 5–15)
BUN: 66 mg/dL — ABNORMAL HIGH (ref 6–20)
CO2: 25 mmol/L (ref 22–32)
Calcium: 7.6 mg/dL — ABNORMAL LOW (ref 8.9–10.3)
Chloride: 113 mmol/L — ABNORMAL HIGH (ref 101–111)
Creatinine, Ser: 2.59 mg/dL — ABNORMAL HIGH (ref 0.61–1.24)
GFR, EST AFRICAN AMERICAN: 28 mL/min — AB (ref >60)
GFR, EST NON AFRICAN AMERICAN: 24 mL/min — AB (ref >60)
Glucose, Bld: 147 mg/dL — ABNORMAL HIGH (ref 65–99)
Potassium: 3.7 mmol/L (ref 3.5–5.1)
Sodium: 149 mmol/L — ABNORMAL HIGH (ref 135–145)

## 2017-07-10 LAB — GLUCOSE, CAPILLARY
GLUCOSE-CAPILLARY: 134 mg/dL — AB (ref 65–99)
GLUCOSE-CAPILLARY: 132 mg/dL — AB (ref 65–99)
Glucose-Capillary: 109 mg/dL — ABNORMAL HIGH (ref 65–99)
Glucose-Capillary: 127 mg/dL — ABNORMAL HIGH (ref 65–99)

## 2017-07-10 LAB — MAGNESIUM: Magnesium: 2.9 mg/dL — ABNORMAL HIGH (ref 1.7–2.4)

## 2017-07-10 MED ORDER — MIDAZOLAM BOLUS VIA INFUSION (WITHDRAWAL LIFE SUSTAINING TX)
5.0000 mg | INTRAVENOUS | Status: DC | PRN
  Administered 2017-07-10: 5 mg via INTRAVENOUS
  Filled 2017-07-10: qty 20

## 2017-07-10 MED ORDER — COLLAGENASE 250 UNIT/GM EX OINT
TOPICAL_OINTMENT | Freq: Every day | CUTANEOUS | Status: DC
  Filled 2017-07-10: qty 30

## 2017-07-10 MED ORDER — ACETAMINOPHEN 160 MG/5ML PO SOLN: 650.0000 mg | Freq: Four times a day (QID) | ORAL | Status: DC | PRN

## 2017-07-10 MED ORDER — FENTANYL BOLUS VIA INFUSION
50.0000 ug | INTRAVENOUS | Status: DC | PRN
  Administered 2017-07-10: 100 ug via INTRAVENOUS
  Filled 2017-07-10: qty 200

## 2017-07-10 MED ORDER — VITAL 1.5 CAL PO LIQD
1000.0000 mL | ORAL | Status: DC
  Filled 2017-07-10: qty 1000

## 2017-07-10 MED ORDER — GLYCOPYRROLATE 0.2 MG/ML IJ SOLN
0.4000 mg | Freq: Four times a day (QID) | INTRAMUSCULAR | Status: DC
  Administered 2017-07-10: 0.4 mg via INTRAVENOUS
  Filled 2017-07-10: qty 2

## 2017-07-11 ENCOUNTER — Telehealth: Payer: Self-pay

## 2017-07-11 NOTE — Telephone Encounter (Signed)
On 07/11/17 I received a d/c from [facility] (faxed). The d/c is for cremation. The patient is a patient of Doctor Vassie Loll. The d/c will be taken to [facility] for signature.  On 07/12/17 I received the d/c back from Doctor Vassie Loll. I got the d/c ready and faxed the d/c to the funeral home per the funeral home request.

## 2017-07-14 NOTE — Progress Notes (Signed)
PULMONARY / CRITICAL CARE MEDICINE   Name: Darryl George MRN: [id number] DOB: March 01, 1951    ADMISSION DATE:  06/19/2017 CONSULTATION DATE:  07/08/2017  REFERRING MD:  Dr. Elyse Jarvis Penn  CHIEF COMPLAINT:  hypoxia  HISTORY OF PRESENT ILLNESS:   67 year old male with past medical history of COPD, hypertension, stroke, and tobacco abuse.  He was recently admitted for COPD exacerbation in which he was discharged (2/10) on 4-6 L of supplemental oxygen, azithromycin, and a steroid taper.  He then presented to [facility] emergency department on 2/13 with complaints of severe dyspnea. ,Chest x-ray demonstrated severe diffuse infiltrates.  He was transferred to [facility] for higher level ICU care in the setting of presumed ARDS.   STUDIES:  06/30/2017 2D echo> EF 50-55%, PA peak pressure of 52  CULTURES: BCx2 2/13 >ng Urine 2/13 >ng RVP 2/14 > negative Tracheal aspirate 2/15 > Mold, saprophyte BAL 2/18> ng  ANTIBIOTICS: Zosyn 2/13 > Vancomycin 2/13 > Eraxis 2/18> 07/07/2017  SIGNIFICANT EVENTS: 2/14- Admit with ARDS, paralyzed for 24 hrs with improvement 2/17- Worsening oxygenation, dyssynchrony. paralyzed again.  2/18- Bronch, BAL, started proning 2/20- Weaning off nimbex, continue proning 2/23 nimbex restarted  LINES/TUBES: ETT 2/13 > Fem CVL 2/13 > 2/18 Lt IJ CVL 2/18>  06/29/2017 right radial A-line>>   COURSE 67 year old male with COPD recently admitted for exacerbation presented again 2/13 with BL pneumonia/ ARDS. Prone positioning x multiple times   Paralyzed with Nimbex Shock: presumably septic shock, however, could be related to high PEEP and sedation needs.  Atrial fibrillation with RVR now sinus echo shows normal LV with EF of 50-55%.  No diastolic dysfunction.  Elevated PA pressures of 52. Amiodarone infusion stopped on 06/30/2017 for sinus bradycardia Treated with broad-spectrum antibiotics Family updated daily.  Sister/partner do not want the patient to have a  trach or PEG.  Made DNR 2/23.  Confirm that daughters were on the same page. Finally life support was withdrawn and he passed away soon after.  Cause of death-ARDS due to pneumonia, underlying COPD and hypertension     Kara Mead MD. FCCP. [place] [facility] Pager [phone]   07/13/2017

## 2017-07-14 NOTE — Consult Note (Signed)
Consultation Note Date: 2017-07-21   Patient Name: Darryl George  DOB: 1950/07/19  MRN: [id number]  Age / Sex: 67 y.o., male  PCP: System, Provider Not In Referring Physician: Marshell Garfinkel, MD  Reason for Consultation:   To establish GOCs and emotional support   HPI/Patient Profile: 67 y.o. male  admitted on 06/21/2017 with past medical history significant for COPD, hypertension, stroke, and tobacco abuse.  He was recently admitted for COPD exacerbation and he was discharged on February 10 on 4-6 L of supplemental oxygen, azithromycin, and a steroid taper.  Unfortunately he was experiencing severe dyspnea and was admitted through [facility] emergency room.  He was found to be hypoxic with oxygen saturations in the 50s on a nonrebreather.  He was emergently intubated.  He also developed atrial fibrillation with rapid ventricular response and was started on amiodarone.  Chest x-ray demonstrated severe diffuse infiltrates.  He was started on broad-spectrum antibiotics and sedated for vent dyssynchrony.  He was transferred to [facility] for higher level ICU care in the setting of ARDS.  Since admission in spite of aggressive medical interventions patient has not responded and remains intubated.  Family was faced with advanced directive decisions.  Main option decisions and anticipatory care needs.   Clinical Assessment and Goals of Care:  This NP Darryl George reviewed medical records, received report from team, assessed the patient and then meet at the patient's bedside along with his family to include his Sister Darryl George, and Darryl George.  His daughter Darryl George and his significant other Darryl George are also present,  to discuss diagnosis, prognosis, GOC, EOL wishes disposition and options.  Concept of was Palliative Care were discussed  A  discussion was had today regarding advanced directives.  Although there is no  documented advanced directives all present in today's conversation agree that Darryl George would not want to continue with the current medical interventions knowing that long-term outcome would not be in the context of quality of life is he has prescribed for himself in the past.  The difference between a aggressive medical intervention path  and a palliative comfort care path for this patient at this time was had.  Values and goals of care important to patient and family were attempted to be elicited.  We discussed the process of liberating the patient from the ventilator, and shifting to a comfort and dignity approach.  Discussed with family that it is likely that Darryl George will not survive for any length of time once he is liberated from the vent.  They have been prepared that anything could happen at any time and life expectancy may be only  minutes to hours   Natural trajectory and expectations at EOL were discussed.  Questions and concerns addressed.     PMT will continue to support holistically.   There is no documented healthcare power of attorney however his 3 sisters, significant other, and daughter Darryl George are all working together as a unit in the patient's best interest.  All are in agreement with  a one-way extubation today and to shift to a full comfort path allowing a natural death.    SUMMARY OF RECOMMENDATIONS    Code Status/Advance Care Planning:  DNR   One-way extubation today at 5:30 PM.  Paralytic was turned off at 330 and as discussed with pharmacy and Dr. Elsworth George the 2-hour timeframe will give appropriate time for Darryl before extubation.   Symptom Management:   Dyspnea/Pain: Fentanyl gtt with bolus ( see orders)  Agitation: Versed gtt with bolus ( see orders)  Terminal secretions: Robinul 0.4 mg IV 4 times a day  Palliative Prophylaxis:   Eye Care, Frequent Pain Assessment, Oral Care and Palliative Wound Care  Additional Recommendations (Limitations, Scope,  Preferences):  Full Comfort Care  Psycho-social/Spiritual:   Desire for further Chaplaincy support:yes  Additional Recommendations: Grief/Bereavement Support  Prognosis:   Hours - Days  Discharge Planning: Anticipated Hospital Death      Primary Diagnoses: Present on Admission: . ARDS (adult respiratory distress syndrome) ([place])   I have reviewed the medical record, interviewed the patient and family, and examined the patient. The following aspects are pertinent.  Past Medical History:  Diagnosis Date  . Back pain   . COPD (chronic obstructive pulmonary disease) ([place])   . Hypertension   . Stroke [facility])    Noted on MRI scan performed at [facility]  . Tobacco abuse    Social History   Socioeconomic History  . Marital status: Divorced    Spouse name: None  . Number of children: None  . Years of education: None  . Highest education level: None  Social Needs  . Financial resource strain: None  . Food insecurity - worry: None  . Food insecurity - inability: None  . Transportation needs - medical: None  . Transportation needs - non-medical: None  Occupational History  . None  Tobacco Use  . Smoking status: Former Smoker    Packs/day: 1.00    Types: Cigarettes  . Smokeless tobacco: Never Used  Substance and Sexual Activity  . Alcohol use: No    Comment: once a month  . Drug use: No  . Sexual activity: None  Other Topics Concern  . None  Social History Narrative  . None   Family History  Problem Relation Age of Onset  . Cancer Mother   . Heart failure Mother   . Kidney disease Mother   . Aneurysm Father   . Hypertension Father   . Cancer Other   . Breast cancer Sister    Scheduled Meds: . artificial tears   Both Eyes Q8H  . chlorhexidine gluconate (MEDLINE KIT)  15 mL Mouth Rinse BID  . Chlorhexidine Gluconate Cloth  6 each Topical Q0600  . collagenase   Topical Daily  . feeding supplement (PRO-STAT SUGAR FREE 64)  30 mL Oral QID  . free water  200  mL Per Tube Q8H  . furosemide  40 mg Intravenous Daily  . heparin  5,000 Units Subcutaneous Q8H  . insulin aspart  2-6 Units Subcutaneous Q4H  . insulin detemir  5 Units Subcutaneous Q12H  . mouth rinse  15 mL Mouth Rinse 10 times per day  . pantoprazole sodium  40 mg Per Tube Daily  . polyethylene glycol  17 g Oral Daily   Continuous Infusions: . cisatracurium (NIMBEX) infusion 7.989 mcg/kg/min (Jul 19, 2017 1200)  . feeding supplement (VITAL 1.5 CAL) 1,000 mL (July 19, 2017 1200)  . fentaNYL infusion INTRAVENOUS 400 mcg/hr (19-Jul-2017 1215)  . midazolam (VERSED) infusion 10  mg/hr (08-Aug-2017 1315)  . norepinephrine (LEVOPHED) Adult infusion Stopped (07/09/17 0600)  . piperacillin-tazobactam (ZOSYN)  IV 3.375 g (August 08, 2017 1300)   PRN Meds:.acetaminophen (TYLENOL) oral liquid 160 mg/5 mL, fentaNYL, ipratropium-albuterol, metoprolol tartrate, midazolam Medications Prior to Admission:  Prior to Admission medications   Medication Sig Start Date End Date Taking? Authorizing Provider  albuterol (PROVENTIL HFA;VENTOLIN HFA) 108 (90 Base) MCG/ACT inhaler Inhale 2 puffs into the lungs every 6 (six) hours as needed for wheezing or shortness of breath.   Yes [provider]  atorvastatin (LIPITOR) 80 MG tablet Take 1 tablet (80 mg total) by mouth daily at 6 PM. Patient taking differently: Take 80 mg by mouth at bedtime.  02/11/16  Yes Isaiah Serge, NP  azithromycin (ZITHROMAX) 250 MG tablet Take one tablet daily for 4 days 06/26/17  Yes Isaac Bliss, Rayford Halsted, MD  Melatonin 3 MG TABS Take 6 mg by mouth at bedtime.   Yes [provider]  predniSONE (DELTASONE) 10 MG tablet Take 1 tablet (10 mg total) by mouth daily with breakfast. Take 6 tablets today and then decrease by 1 tablet daily until none are left. 06/25/17  Yes Isaac Bliss, Rayford Halsted, MD   No Known Allergies Review of Systems  Unable to perform ROS: Intubated    Physical Exam  Constitutional: He appears well-developed. He  appears ill. He is intubated.  Cardiovascular: Tachycardia present.  Pulmonary/Chest: Tachypnea noted. He is intubated.  Neurological: He is unresponsive.  Skin: Skin is warm and dry.    Vital Signs: BP (!) 116/58   George (!) 114   Temp 99.9 F (37.7 C)   Resp (!) 30   Ht _0  (1.854 m)   Wt 81.5 kg (179 lb 10.8 oz)   SpO2 95%   BMI 23.71 kg/m  Pain Assessment: CPOT POSS *See Group Information*: 1-Acceptable,Awake and alert Pain Score: 0-No pain   SpO2: SpO2: 95 % O2 Device:SpO2: 95 % O2 Flow Rate: .   IO: Intake/output summary:   Intake/Output Summary (Last 24 hours) at 2017-08-08 1533 Last data filed at 2017/08/08 1400 Gross per 24 hour  Intake 3669.75 ml  Output 2105 ml  Net 1564.75 ml    LBM: Last BM Date: August 08, 2017 Baseline Weight: Weight: 80.7 kg (178 lb) Most recent weight: Weight: 81.5 kg (179 lb 10.8 oz)     Palliative Assessment/Data: 20 %   Discussed with Dr. Elsworth George  Time In: 1515 Time Out: 1630 Time Total: 75 minutes Greater than 50%  of this time was spent counseling and coordinating care related to the above assessment and plan.  Signed by: Darryl Lessen, NP   Please contact Palliative Medicine Team phone at [phone] for questions and concerns.  For individual provider: See Shea Evans

## 2017-07-14 NOTE — Progress Notes (Signed)
eLink Physician-Brief Progress Note Patient Name: Darryl George DOB: 1950/11/16 MRN: [id number]   Date of Service  07/03/2017  HPI/Events of Note  Watery stool - Request for Flexiseal.   eICU Interventions  Will order: 1. Will order Flexiseal.      Intervention Category Major Interventions: Other:  Miana Politte Dennard Nip 07/12/2017, 12:24 AM

## 2017-07-14 NOTE — Consult Note (Signed)
WOC Nurse wound consult note Refer to previous consult note. Reason for Consult: Pt is critically ill and has been receiving Pronation therapy to attempt to optimize lung function. This is discontinued today and he is placed on a low-airloss bed. Wound type: Previous dark purple-red deep tissue injury, with loose peeling skin to wound edges remains to buttocks; 9X4cm total affected area.  Sacrum and inner gluteal fold is unstageable; 3X1cm, tightly adhered yellow slough, small amt yellow drainage, no odor. Pressure Injury POA: No Dressing procedure/placement/frequency: Begin Santyl for enzymatic debridement of nonviable tissue. Foam dressing is in place to protect and promote healing.  Deep tissue injuries are high risk to evolve into full thickness tissue loss.  No family present to discuss plan of care and pt is not responsive at this time. Please re-consult if further assistance is needed.  Thank-you,  Cammie Mcgee MSN, RN, CWOCN, [place], CNS [phone]

## 2017-07-14 NOTE — Progress Notes (Signed)
PULMONARY / CRITICAL CARE MEDICINE   Name: Darryl George MRN: [id number] DOB: [birth year]    ADMISSION DATE:  06/23/2017 CONSULTATION DATE:  07/12/2017  REFERRING MD:  Dr. Elyse Jarvis Penn  CHIEF COMPLAINT:  hypoxia  HISTORY OF PRESENT ILLNESS:   67 year old male with past medical history of COPD, hypertension, stroke, and tobacco abuse.  He was recently admitted for COPD exacerbation in which he was discharged (2/10) on 4-6 L of supplemental oxygen, azithromycin, and a steroid taper.  He then presented to [facility] emergency department on 2/13 with complaints of severe dyspnea. ,Chest x-ray demonstrated severe diffuse infiltrates.  He was transferred to [facility] for higher level ICU care in the setting of presumed ARDS.   STUDIES:  06/30/2017 2D echo> EF 50-55%, PA peak pressure of 52  CULTURES: BCx2 2/13 >ng Urine 2/13 >ng RVP 2/14 > negative Tracheal aspirate 2/15 > Mold, saprophyte BAL 2/18> ng  ANTIBIOTICS: Zosyn 2/13 > Vancomycin 2/13 > Eraxis 2/18> 07/07/2017  SIGNIFICANT EVENTS: 2/14- Admit with ARDS, paralyzed for 24 hrs with improvement 2/17- Worsening oxygenation, dyssynchrony. paralyzed again.  2/18- Bronch, BAL, started proning 2/20- Weaning off nimbex, continue proning 2/23 nimbex restarted  LINES/TUBES: ETT 2/13 > Fem CVL 2/13 > 2/18 Lt IJ CVL 2/18>  06/29/2017 right radial A-line>>   SUBJECTIVE:  Remains critically il, sedated , paralysed, intubated Low gr febrile Good UO with lasix   VITAL SIGNS: BP 128/65   Pulse (!) 113   Temp 99.9 F (37.7 C)   Resp (!) 30   Ht _0  (1.854 m)   Wt 179 lb 10.8 oz (81.5 kg)   SpO2 91%   BMI 23.71 kg/m   HEMODYNAMICS: CVP:  [1 mmHg-14 mmHg] 5 mmHg  VENTILATOR SETTINGS: Vent Mode: PRVC FiO2 (%):  [50 %-80 %] 60 % Set Rate:  [30 bmp] 30 bmp Vt Set:  [550 mL] 550 mL PEEP:  [14 cmH20] 14 cmH20 Plateau Pressure:  [28 cmH20-30 cmH20] 28 cmH20  INTAKE / OUTPUT: I/O last 3 completed shifts: In: 6460.5  [I.V.:3040.5; Other:40; NG/GT:2980; IV Piggyback:400] Out: 2650 [Urine:2350; Stool:300]  PHYSICAL EXAMINATION:  Gen:      No acute distress, paralyzed and sedated HEENT:  sclera anicteric, ETT tube in place Neck:     No masses; no thyromegaly Lungs:    Clear to auscultation bilaterally; normal respiratory effort CV:         Regular rate and rhythm; no murmurs Abd:      soft, non-tender; no palpable masses, no distension Ext:    No edema; adequate peripheral perfusion Skin:      Warm and dry; no rash Neuro: Sedated, paralyzed  LABS:  BMET Recent Labs  Lab 07/07/17 0334 07/08/17 0241 07/09/17 0302  NA 149* 148* 146*  K 3.9 3.9 3.7  CL 113* 111 111  CO2 _1 BUN 45* 48* 60*  CREATININE 2.14* 2.13* 2.43*  GLUCOSE 146* 127* 143*   Electrolytes Recent Labs  Lab 07/07/17 0334 07/08/17 0241 07/09/17 0302  CALCIUM 7.1* 7.3* 7.4*  MG 2.6* 2.4 2.7*  PHOS 4.3 4.4 4.0   CBC Recent Labs  Lab 07/08/17 0241 07/09/17 0302 08-07-17 1109  WBC 9.7 10.2 8.2  HGB 8.2* 8.2* 8.1*  HCT 27.3* 27.1* 26.1*  PLT 312 344 345   Coag's No results for input(s): APTT, INR in the last 168 hours.  Sepsis Markers Recent Labs  Lab 07/05/17 0903 07/06/17 0316 07/07/17 0334  PROCALCITON 1.00 0.85 0.78  ABG Recent Labs  Lab 07/08/17 1951 07/08/17 2214 07/09/17 0337  PHART 7.258* 7.319* 7.358  PCO2ART 67.0* 56.4* 51.7*  PO2ART 80.0* 100.0 70.0*   Liver Enzymes Recent Labs  Lab 07/08/17 0241 07/09/17 0302  AST 40 54*  ALT 48 56  ALKPHOS 185* 317*  BILITOT 0.5 0.8  ALBUMIN 1.2* 1.2*   Cardiac Enzymes No results for input(s): TROPONINI, PROBNP in the last 168 hours. Glucose Recent Labs  Lab 07/09/17 1545 07/09/17 1950 07/09/17 2319 July 24, 2017 0311 24-Jul-2017 0814 2017-07-24 0913  GLUCAP 109* 113* 127* 109* 134* 127*    Imaging No results found.  DISCUSSION: 67 year old male with COPD recently admitted for exacerbation presented again 2/13 with BL pneumonia/  ARDS. Prone positioning x multiple times    ASSESSMENT / PLAN:  PULMONARY A: ARDS, multilobar pneumonia, flu negative COPD on home O2 Discussed with [facility] 2/19- Not a candidate for ECMO due to age, co morbidities and as he has stabilized on proning.  P:   Continue ARDS protocol Ct Nimbex x 24 more hours Family does not want long-term tracheostomy.      CARDIOVASCULAR A:  Shock: presumably septic shock, however, could be related to high PEEP and sedation needs.  Atrial fibrillation with RVR now sinus echo shows normal LV with EF of 50-55%.  No diastolic dysfunction.  Elevated PA pressures of 52. P:  Telemetry monitoring Amiodarone infusion stopped on 06/30/2017 for sinus bradycardia    RENAL A:   AKI Hypernatremia Hypokalemia replete as needed Hypocalcemia replete as needed P:   Follow BUN/creatinine, urine output. Lasix  40 mg daily  Hypernatremia improving.  Continue with free water flushes through PEG tube.   GASTROINTESTINAL A:   No acute issues P:   Continue tube feeds PPI Patient has not had a bowel movement since admission. Dulcolax x 1  HEMATOLOGIC Recent Labs    07/09/17 0302 2017/07/24 1109  HGB 8.2* 8.1*   A:   Anemia of critical illness  P:  SubQ heparin.  Follow CBC Transfuse for Hb <7  INFECTIOUS A:   PNA vs respiratory infection.  Respiratory virus panel is negative Mold in sputum. ? pathogen  P:   Continue Vanco, Zosyn x 14 days Patient was on Eraxis for 5 days.   Recheck Pct.   ENDOCRINE CBG (last 3)  A:   No acute issues  P:   CBG monitoring SSI  NEUROLOGIC A:   Acute metabolic encephalopathy P:   RASS goal: -5 while on nimbex Patient currently on Versed and fentanyl for sedation.   FAMILY  - Updates: Family updated daily.  Sister/partner do not want the patient to have a trach or PEG.  Made DNR 2/23. Will need to confirm daughters are on the same page - Inter-disciplinary family meet or Palliative Care meeting:  planned 2/25  The patient is critically ill with multiple organ systems failure and requires high complexity decision making for assessment and support, frequent evaluation and titration of therapies, application of advanced monitoring technologies and extensive interpretation of multiple databases. Critical Care Time devoted to patient care services described in this note independent of APP/resident  time is 35 minutes.    Kara Mead MD. [place] [facility].  Pulmonary & Critical care Pager [phone] If no response call [phone]   Jul 24, 2017

## 2017-07-14 NOTE — Progress Notes (Signed)
Bronson Curb RN and Sheilah Pigeon RN listened for heart tones for one full minute. No heart tones were auscultated. Time of death called at 1827. Telemetry strip labeled asystole printed. Pt's family at the bedside and made aware.

## 2017-07-14 NOTE — Discharge Summary (Signed)
PULMONARY / CRITICAL CARE MEDICINE   Name: ESAU FRIDMAN MRN: [id number] DOB: 12-15-1950    ADMISSION DATE:  06/24/2017 CONSULTATION DATE:  07/06/2017  REFERRING MD:  Dr. Elyse Jarvis Penn  CHIEF COMPLAINT:  hypoxia  HISTORY OF PRESENT ILLNESS:   67 year old male with past medical history of COPD, hypertension, stroke, and tobacco abuse.  He was recently admitted for COPD exacerbation in which he was discharged (2/10) on 4-6 L of supplemental oxygen, azithromycin, and a steroid taper.  He then presented to [facility] emergency department on 2/13 with complaints of severe dyspnea. ,Chest x-ray demonstrated severe diffuse infiltrates.  He was transferred to [facility] for higher level ICU care in the setting of presumed ARDS.   STUDIES:  06/30/2017 2D echo> EF 50-55%, PA peak pressure of 52  CULTURES: BCx2 2/13 >ng Urine 2/13 >ng RVP 2/14 > negative Tracheal aspirate 2/15 > Mold, saprophyte BAL 2/18> ng  ANTIBIOTICS: Zosyn 2/13 > Vancomycin 2/13 > Eraxis 2/18> 07/07/2017  SIGNIFICANT EVENTS: 2/14- Admit with ARDS, paralyzed for 24 hrs with improvement 2/17- Worsening oxygenation, dyssynchrony. paralyzed again.  2/18- Bronch, BAL, started proning 2/20- Weaning off nimbex, continue proning 2/23 nimbex restarted  LINES/TUBES: ETT 2/13 > Fem CVL 2/13 > 2/18 Lt IJ CVL 2/18>  06/29/2017 right radial A-line>>   COURSE 67 year old male with COPD recently admitted for exacerbation presented again 2/13 with BL pneumonia/ ARDS. Prone positioning x multiple times   Paralyzed with Nimbex Shock: presumably septic shock, however, could be related to high PEEP and sedation needs.  Atrial fibrillation with RVR now sinus echo shows normal LV with EF of 50-55%.  No diastolic dysfunction.  Elevated PA pressures of 52. Amiodarone infusion stopped on 06/30/2017 for sinus bradycardia Treated with broad-spectrum antibiotics Family updated daily.  Sister/partner do not want the patient to have a  trach or PEG.  Made DNR 2/23.  Confirm that daughters were on the same page. Finally life support was withdrawn and he passed away soon after.  Cause of death-ARDS due to pneumonia, underlying COPD and hypertension     Kara Mead MD. FCCP. [facility] Pager [phone]   07/13/2017

## 2017-07-14 NOTE — Progress Notes (Signed)
eLink Physician-Brief Progress Note Patient Name: Darryl George DOB: 15-Apr-1951 MRN: [id number]   Date of Service  07/03/2017  HPI/Events of Note  Request to change Tylenol from suppository to liquid per tube.   eICU Interventions  Will order: 1. D/C Tylenol Suppository. 2. Tylenol liquid 650 mg per tube Q 6 hours PRN Temp > 100.5 F.     Intervention Category Major Interventions: Other:  Sommer,Steven Dennard Nip 06/26/2017, 3:52 AM

## 2017-07-14 NOTE — Procedures (Signed)
Extubation Procedure Note  Patient Details:   Name: Darryl George DOB: 1950/06/27 MRN: [id number]   Airway Documentation:     Evaluation  O2 sats: currently acceptable Complications: No apparent complications Patient did tolerate procedure well. Bilateral Breath Sounds: Rhonchi    Pt extubated per Withdraw of life sustaining measures protocol.   [name] [facility] Jul 27, 2017, 5:52 PM

## 2017-07-14 DEATH — deceased

## 2017-07-17 ENCOUNTER — Telehealth: Payer: Self-pay

## 2017-07-17 NOTE — Telephone Encounter (Signed)
On 07/17/17 I received a d/c from [employer] (original). The d/c is for cremation. The patient is a patient of Doctor Vassie Loll. The d/c will be taken to [facility] for signature.  On 07/18/17 I received the d/c back from Doctor Vassie Loll. I got the d/c ready and called the funeral home to let them know I mailed the d/c to vital records per the funeral home request.

## 2017-08-10 LAB — FUNGAL ORGANISM REFLEX

## 2017-08-10 LAB — FUNGUS CULTURE WITH STAIN

## 2017-08-10 LAB — FUNGUS CULTURE RESULT

## 2017-08-14 LAB — ACID FAST CULTURE WITH REFLEXED SENSITIVITIES (MYCOBACTERIA): Acid Fast Culture: NEGATIVE

## 2017-08-14 LAB — ACID FAST CULTURE WITH REFLEXED SENSITIVITIES

## 2018-04-26 IMAGING — CR DG CHEST 1V PORT
1 series · 1 of 1 positions shown · non-contrast
Comparison: 03/15/2008

CLINICAL DATA: Chest pain, bilateral arm pain. Increased shortness
of breath.

EXAM:
PORTABLE CHEST 1 VIEW

[AP]
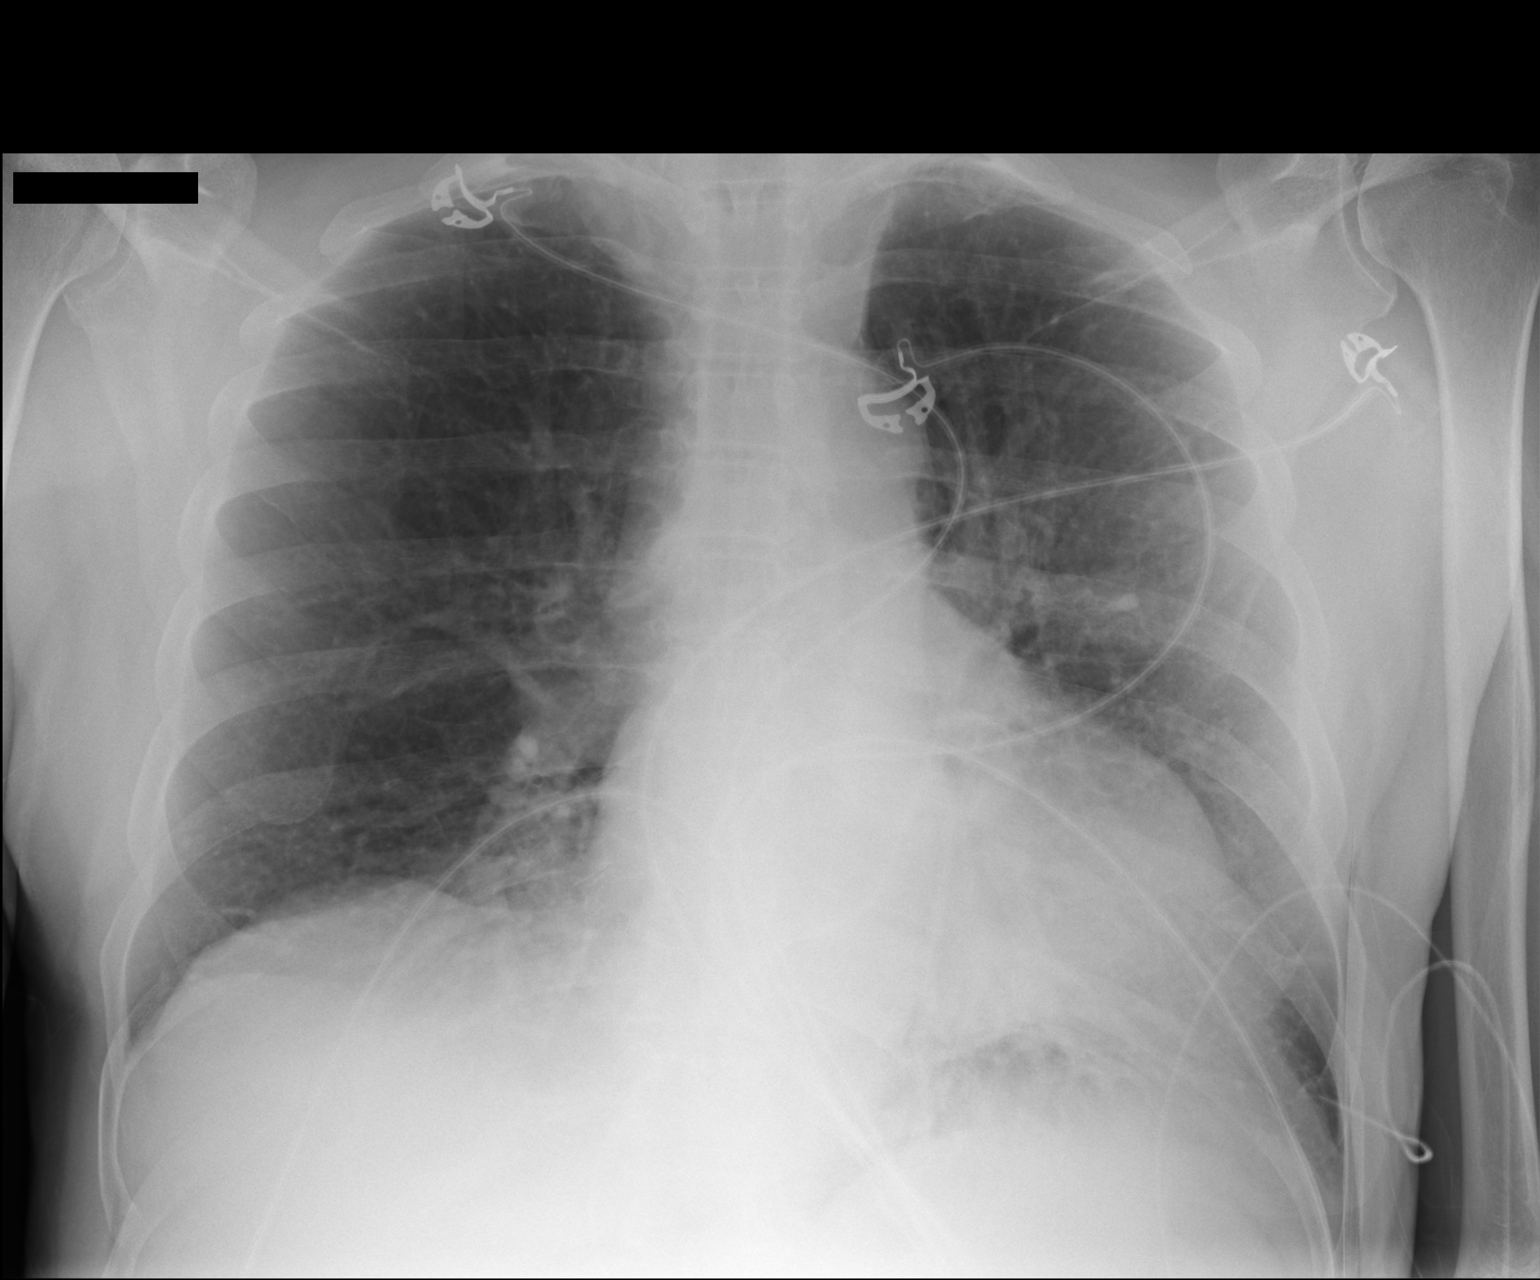

[1 of 1 positions shown; findings below may reference images not displayed]

FINDINGS: AP semi-erect portable chest. Mild opacification in the medial left
lung base could reflect atelectasis or possible infiltrate. No
effusion. Cardiomediastinal silhouette is nonenlarged for portable
technique. No pneumothorax.
IMPRESSION: 1. Mild opacification at the left lung base could reflect
atelectasis or infiltrate.

## 2018-04-30 IMAGING — CR DG CHEST 1V PORT
1 series · 1 of 1 positions shown · non-contrast
Comparison: 01/27/2016

CLINICAL DATA: Shortness of breath. COPD. Hypertension. Chest tube
removal.

EXAM:
PORTABLE CHEST 1 VIEW

[portable]
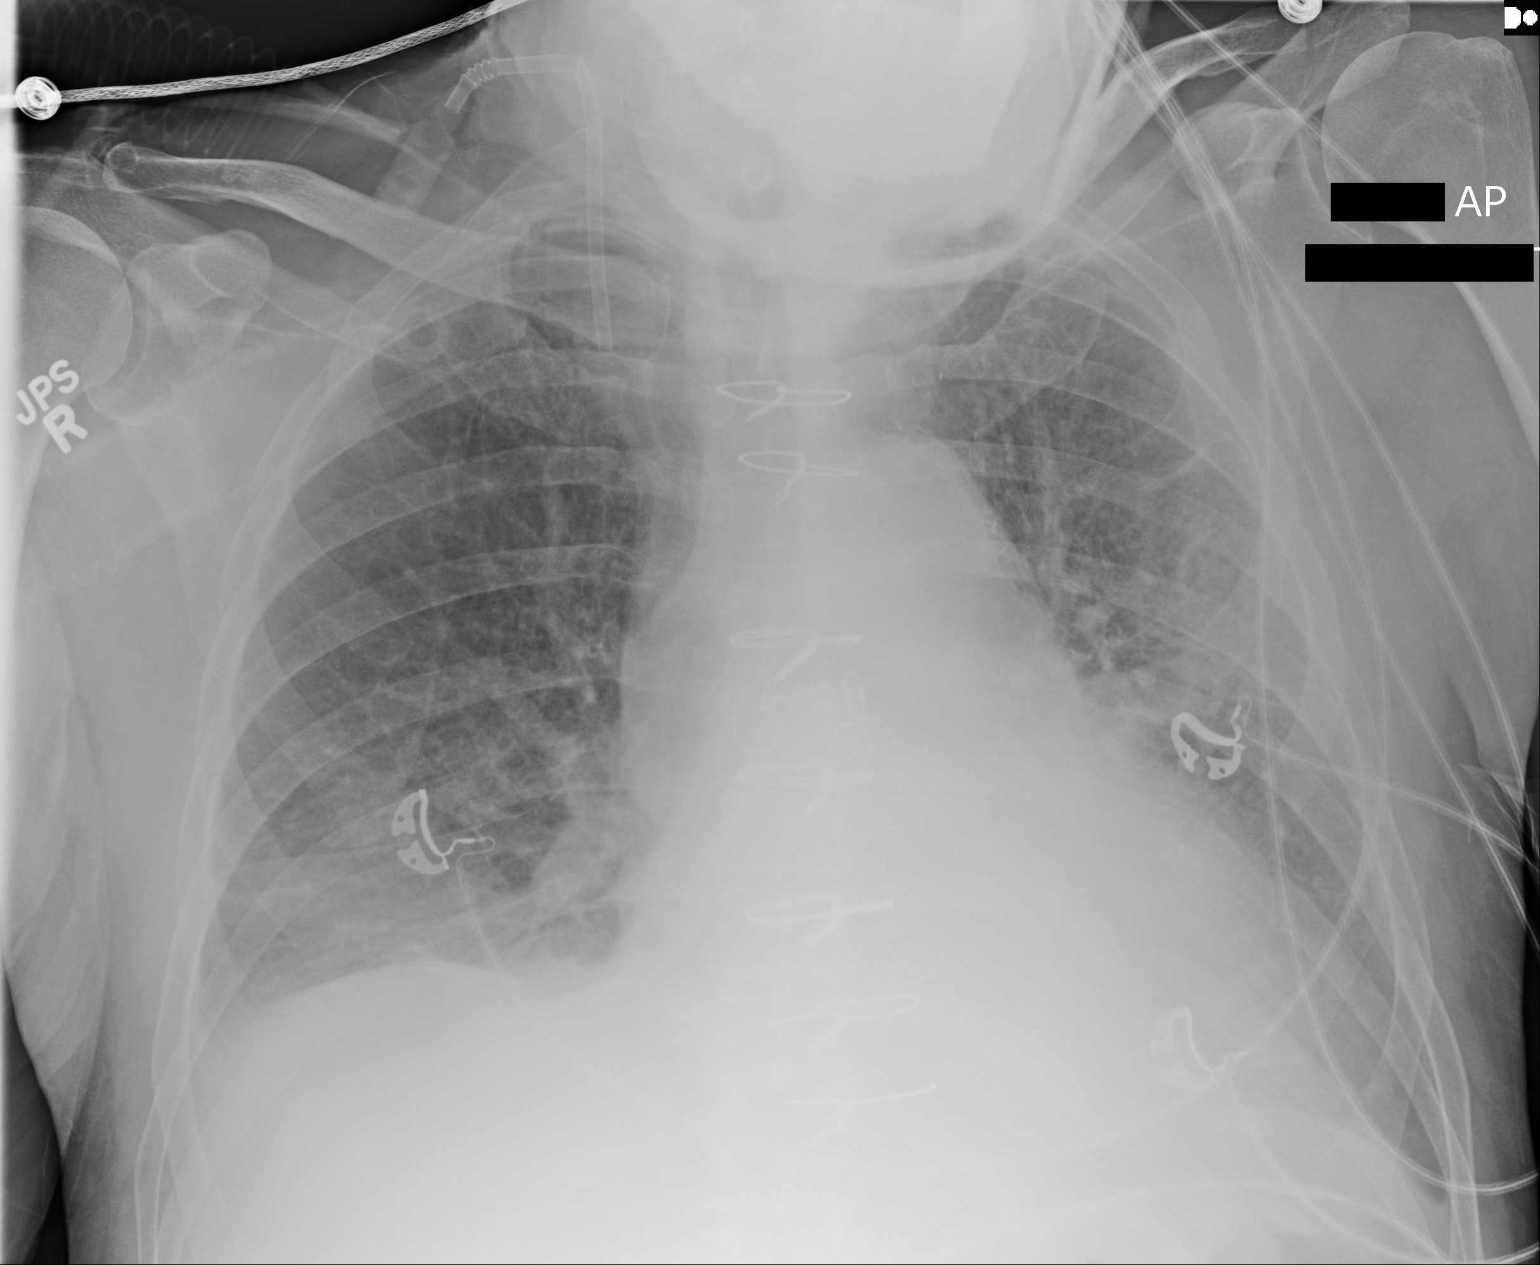

[1 of 1 positions shown; findings below may reference images not displayed]

FINDINGS: Left chest tube is been removed. No pneumothorax. Volume loss
persists in the lower lungs, left worse than right, similar to
yesterday's study. There may be more venous hypertension/fluid
overload.
IMPRESSION: Chest tube removed. No pneumothorax. Persistent lower lobe volume
loss. Question more venous hypertension.

## 2018-05-01 IMAGING — CR DG CHEST 1V PORT
1 series · 1 of 1 positions shown · non-contrast
Comparison: None.

CLINICAL DATA: Shortness of breath

EXAM:
PORTABLE CHEST 1 VIEW

[AP]
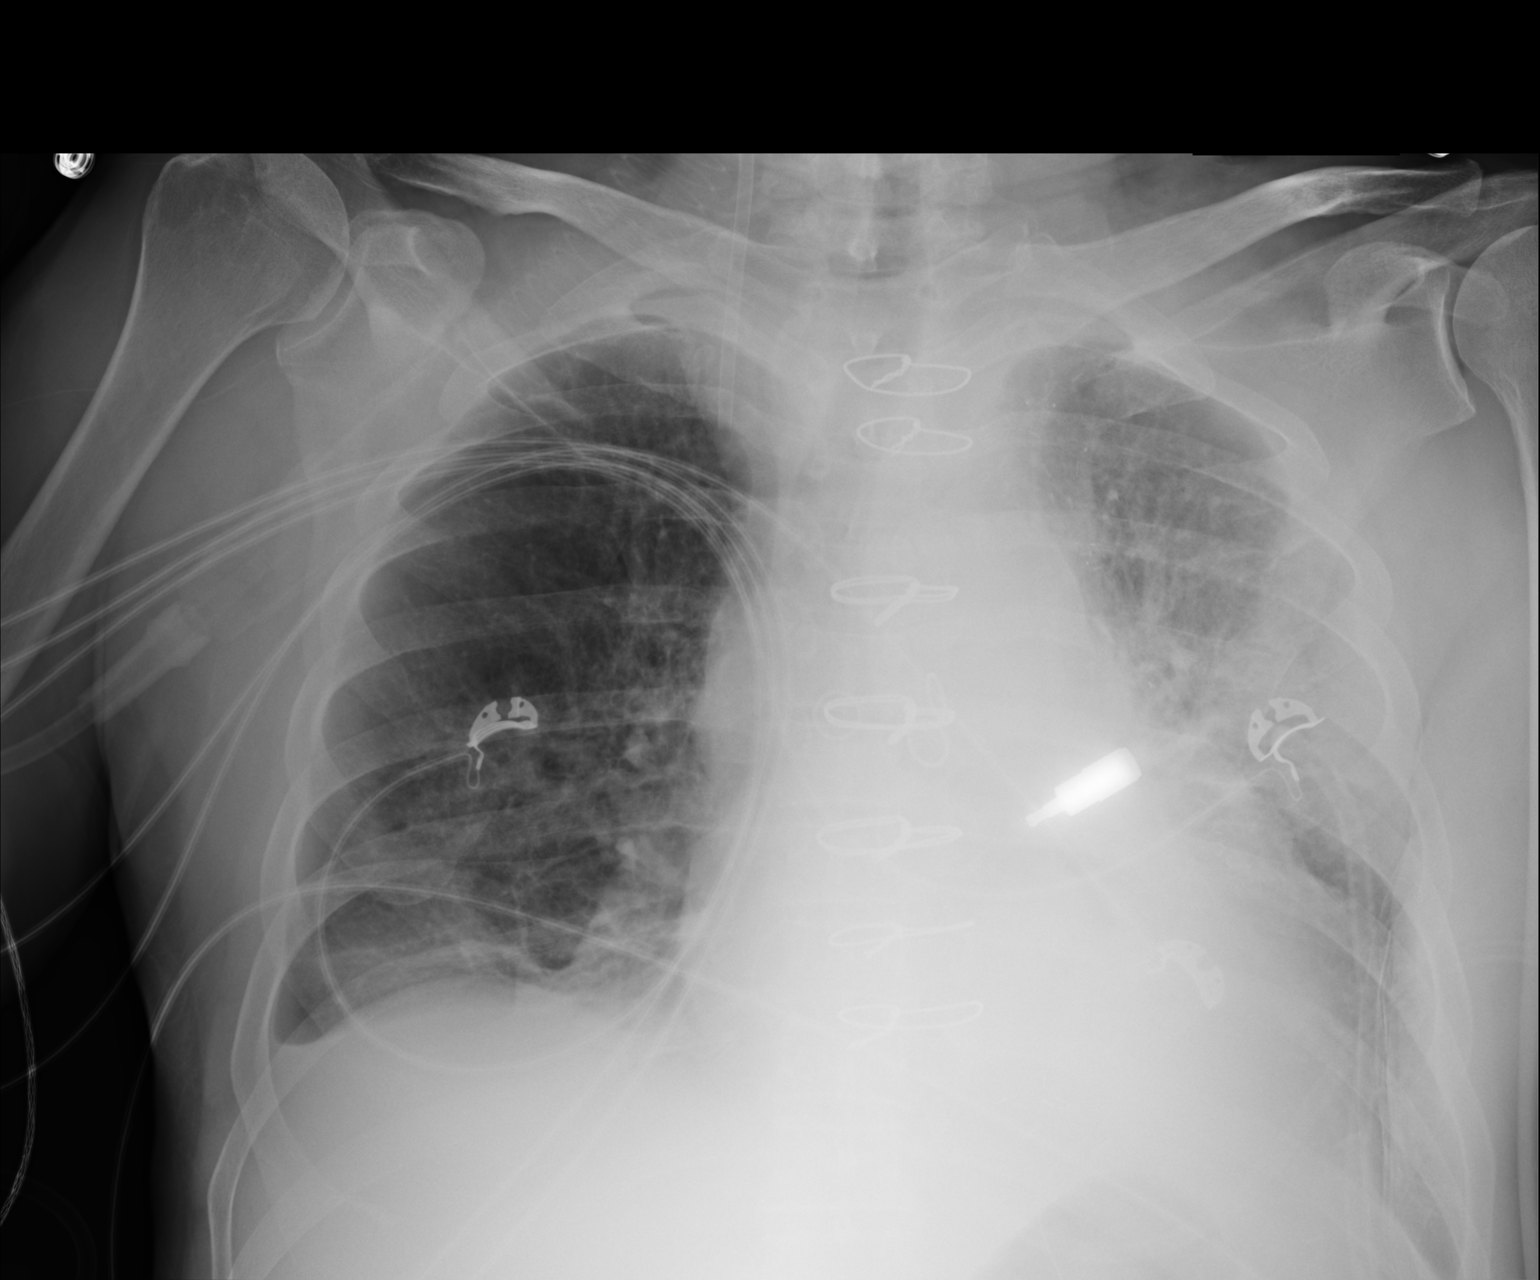

[1 of 1 positions shown; findings below may reference images not displayed]

FINDINGS: There is a a right IJ catheter with tip in the projection of the
SVC. There is moderate cardiac enlargement. There are bilateral
pleural effusions. Increase in pulmonary edema is identified with
worsening aeration left lung.
IMPRESSION: 1. Cardiac enlargement and bilateral pleural effusions noted.
Decreased aeration the left lung compared with previous exam.

## 2018-05-02 IMAGING — DX DG CHEST 1V PORT
1 series · 1 of 1 positions shown · non-contrast
Comparison: 01/29/2016

CLINICAL DATA: Respiratory distress.  Recent CABG surgery.

EXAM:
PORTABLE CHEST 1 VIEW

[chest ap]
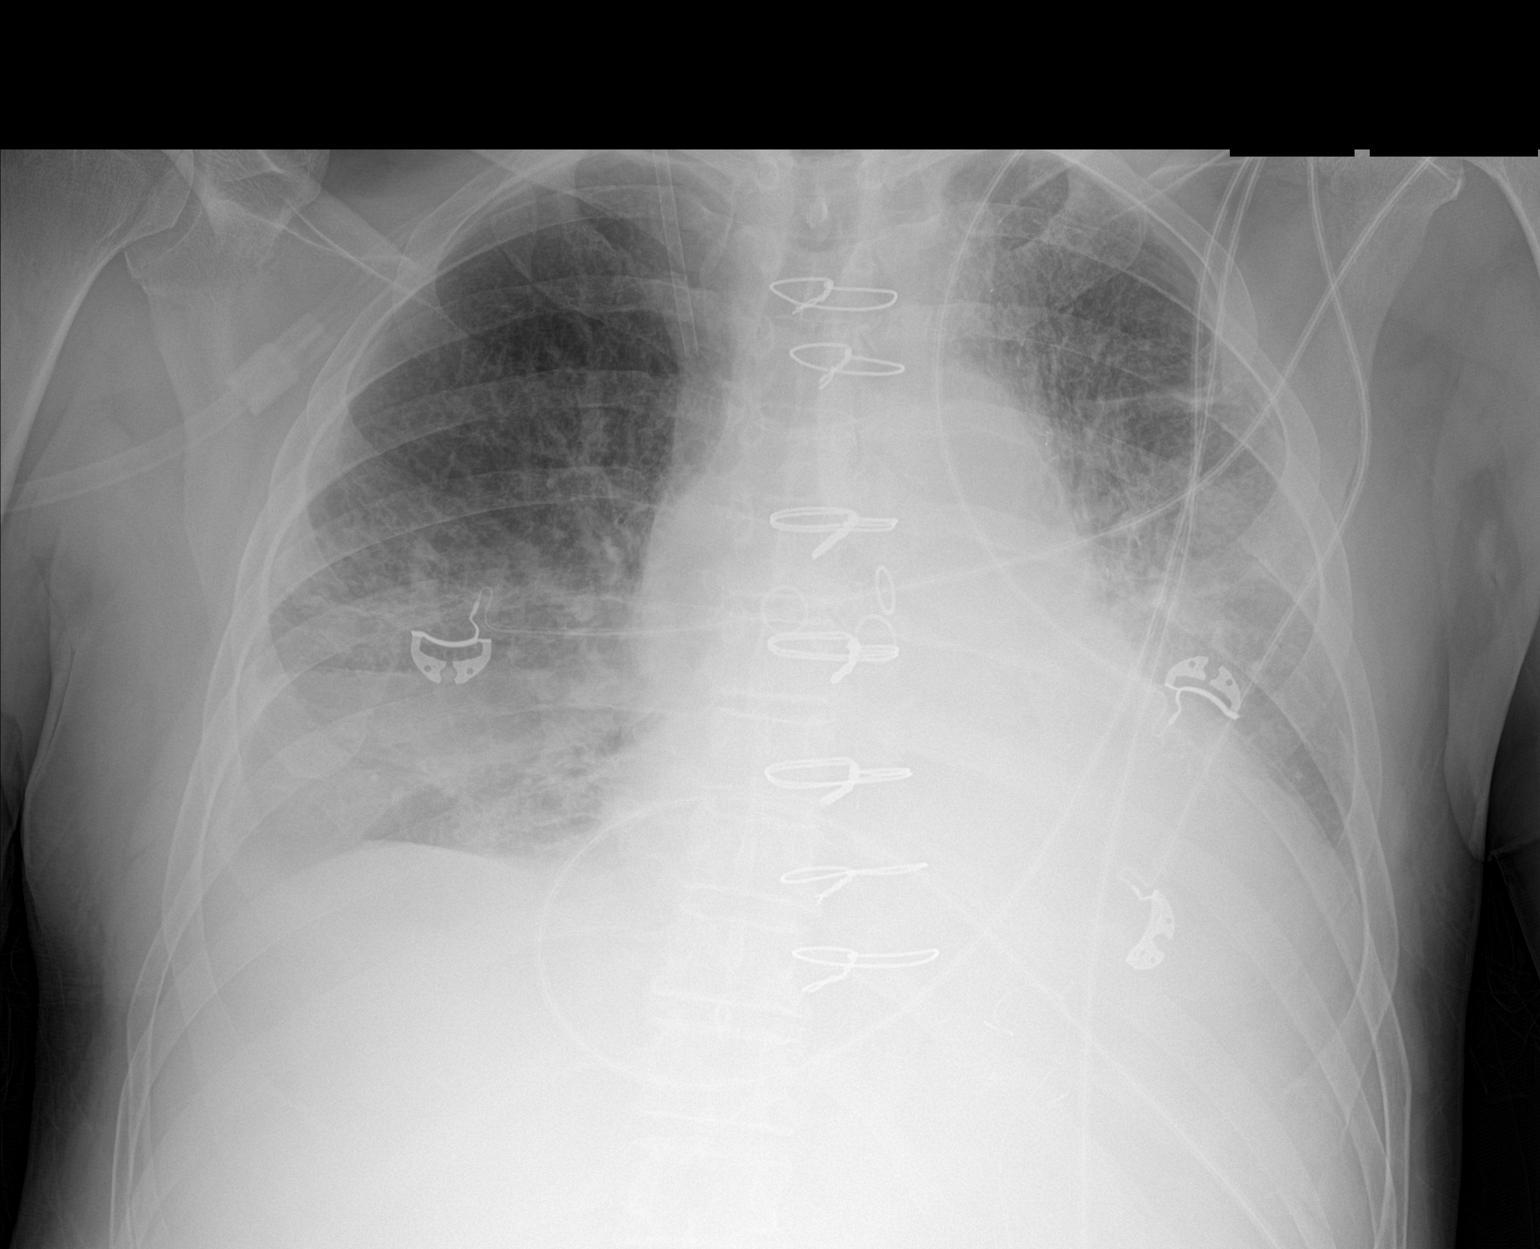

[1 of 1 positions shown; findings below may reference images not displayed]

FINDINGS: Cardiac silhouette is borderline enlarged. There is no mediastinal
widening.

Right internal jugular introducer sheath is stable. All other
support apparatus has been removed.

No pneumothorax.

There is bilateral irregular interstitial thickening. Hazy opacity
is noted in the lung bases likely due to pleural fluid. Allowing
differences in patient positioning and technique, the aeration of
the lungs is similar to the previous day's exam.
IMPRESSION: 1. Persistent irregular interstitial thickening as well as bilateral
pleural effusions likely with associated atelectasis. Findings are
consistent with interstitial pulmonary edema.
2. No mediastinal widening.  No pneumothorax.

## 2018-05-05 IMAGING — DX DG CHEST 2V
2 series · 2 of 2 positions shown · non-contrast
Comparison: 02/01/2016

CLINICAL DATA: Shortness of breath, history of recent coronary
bypass grafting

EXAM:
CHEST  2 VIEW

[chest pa]
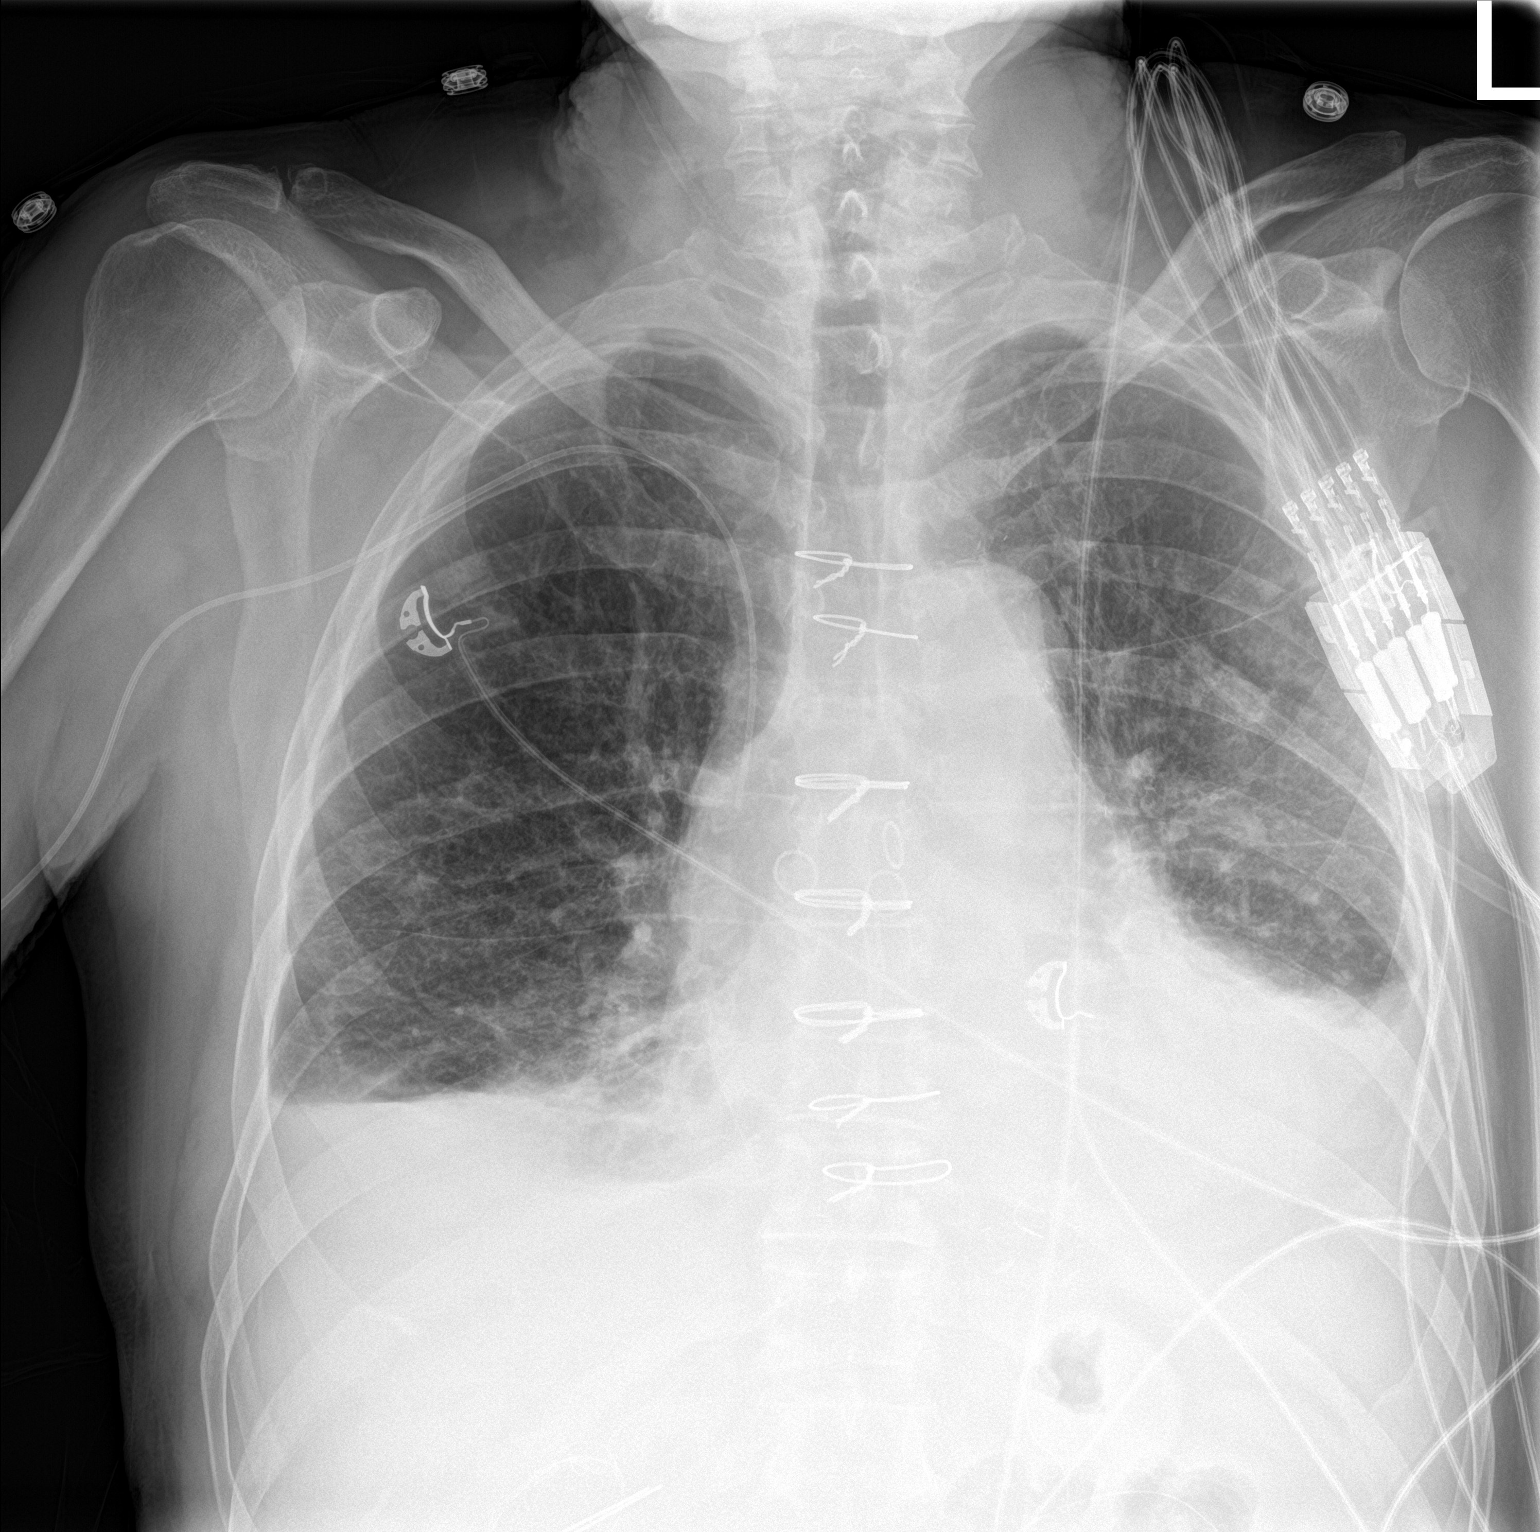

[chest lat]
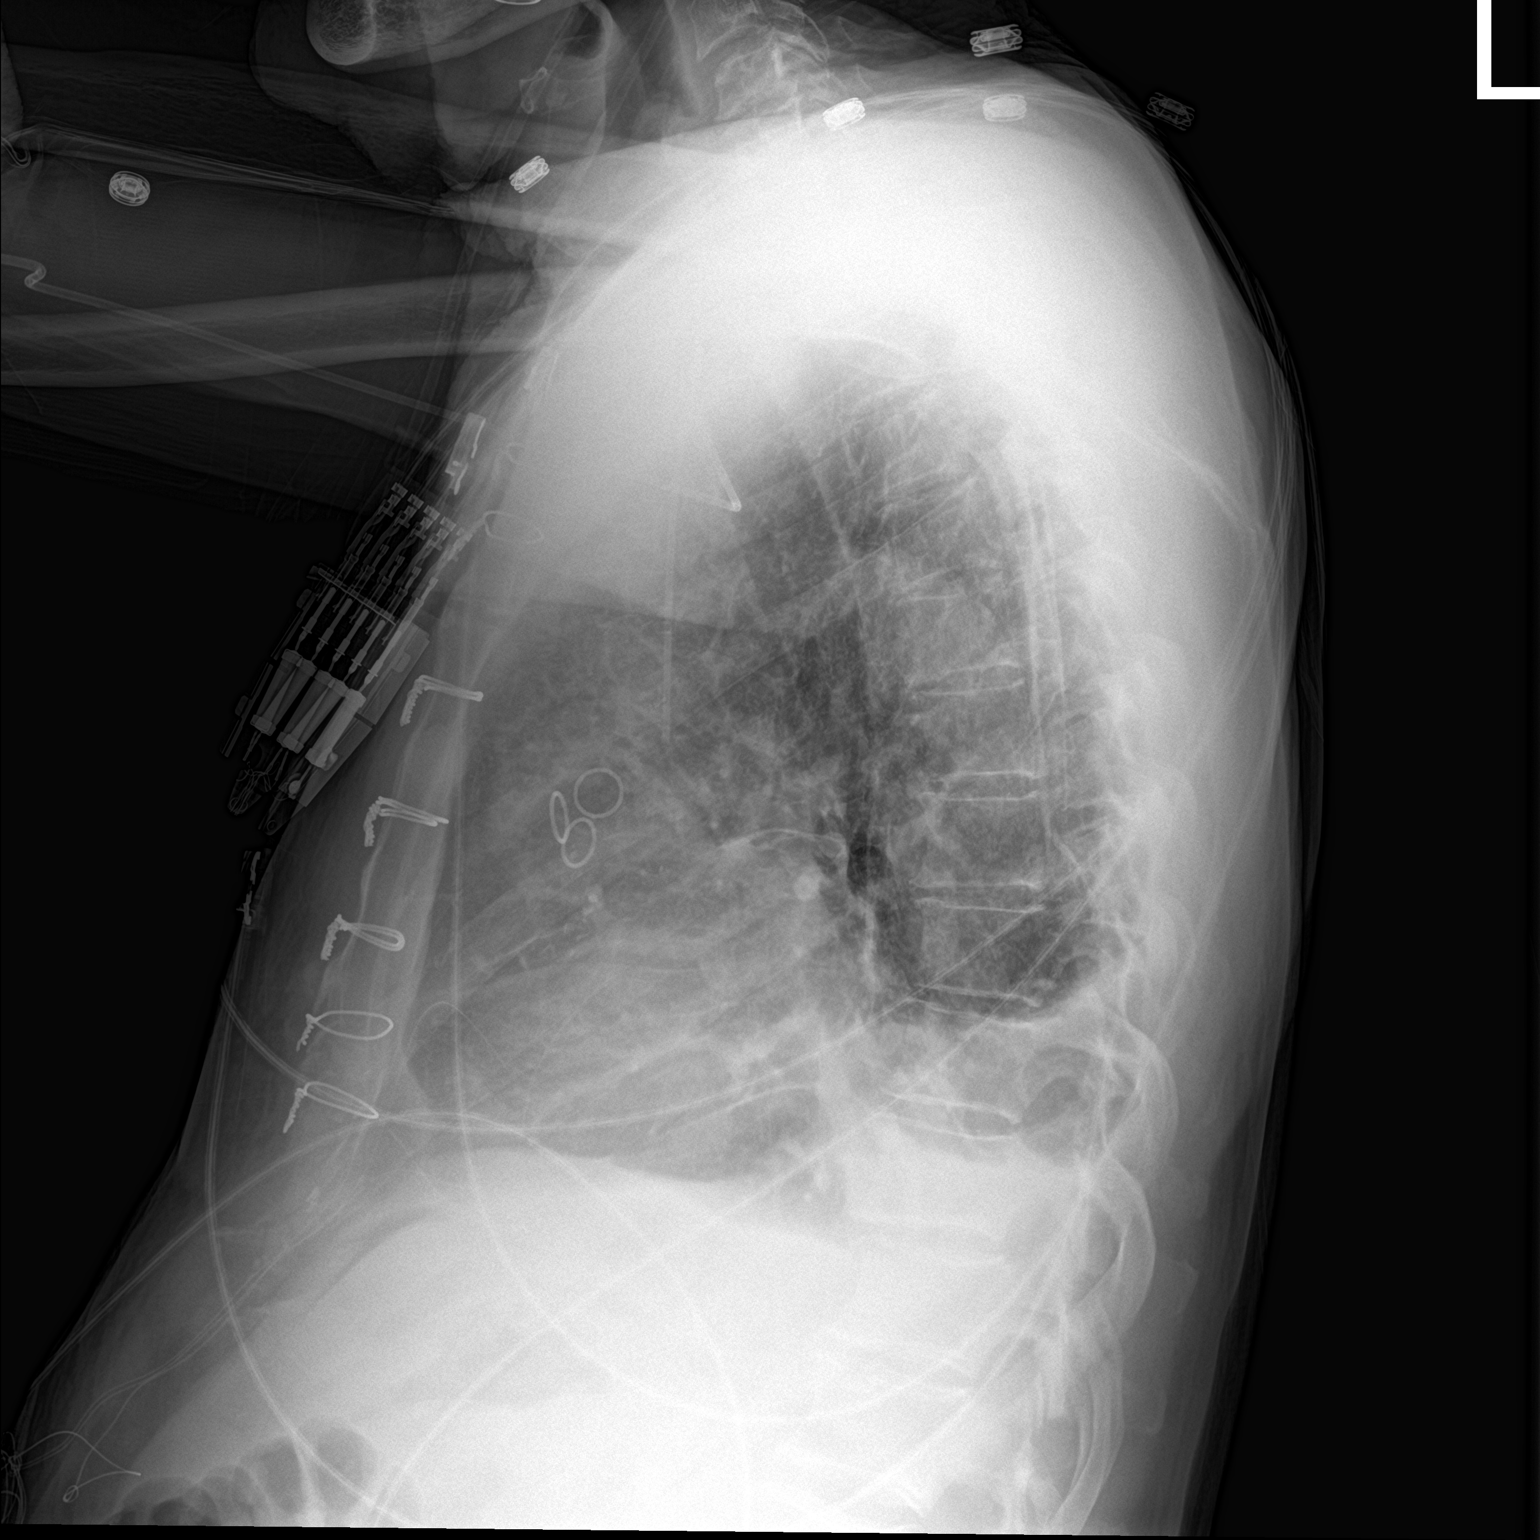

[2 of 2 positions shown; findings below may reference images not displayed]

FINDINGS: Cardiac shadow is mildly enlarged but stable. Postsurgical changes
are again seen. Right-sided PICC line is noted at the cavoatrial
junction in satisfactory position. Bilateral pleural effusions left
greater than right are seen. Bibasilar atelectatic changes are noted
as well. There is improved aeration when compared with the prior
exam. No significant congestive failure is noted.
IMPRESSION: Improved aeration bilaterally with persistent effusions and
atelectasis left greater than right.

PICC line in satisfactory position.
